# Patient Record
Sex: Female | Born: 1980
Health system: Southern US, Community
[De-identification: ages and names within clinical notes are randomized; demographics above are authoritative.]

## PROBLEM LIST (undated history)

## (undated) DIAGNOSIS — F424 Excoriation (skin-picking) disorder: Secondary | ICD-10-CM

## (undated) DIAGNOSIS — R87610 Atypical squamous cells of undetermined significance on cytologic smear of cervix (ASC-US): Secondary | ICD-10-CM

## (undated) DIAGNOSIS — J329 Chronic sinusitis, unspecified: Secondary | ICD-10-CM

## (undated) DIAGNOSIS — F329 Major depressive disorder, single episode, unspecified: Secondary | ICD-10-CM

## (undated) DIAGNOSIS — M779 Enthesopathy, unspecified: Secondary | ICD-10-CM

## (undated) DIAGNOSIS — F32A Depression, unspecified: Secondary | ICD-10-CM

## (undated) HISTORY — PX: COLPOSCOPY: SHX161

## (undated) HISTORY — PX: WISDOM TOOTH EXTRACTION: SHX21

## (undated) HISTORY — DX: Enthesopathy, unspecified: M77.9

---

## 2006-08-29 ENCOUNTER — Ambulatory Visit: Payer: Self-pay | Admitting: Emergency Medicine

## 2007-11-11 ENCOUNTER — Ambulatory Visit: Payer: Self-pay | Admitting: Family Medicine

## 2008-01-29 ENCOUNTER — Ambulatory Visit: Payer: Self-pay | Admitting: Family Medicine

## 2008-11-30 ENCOUNTER — Ambulatory Visit: Payer: Self-pay | Admitting: Internal Medicine

## 2008-12-02 ENCOUNTER — Ambulatory Visit: Payer: Self-pay | Admitting: Internal Medicine

## 2009-02-01 ENCOUNTER — Ambulatory Visit: Payer: Self-pay | Admitting: Internal Medicine

## 2010-01-10 ENCOUNTER — Ambulatory Visit: Payer: Self-pay | Admitting: Internal Medicine

## 2011-07-17 DIAGNOSIS — F424 Excoriation (skin-picking) disorder: Secondary | ICD-10-CM | POA: Insufficient documentation

## 2012-10-30 ENCOUNTER — Ambulatory Visit: Payer: Self-pay | Admitting: Family Medicine

## 2013-07-31 DIAGNOSIS — R87810 Cervical high risk human papillomavirus (HPV) DNA test positive: Secondary | ICD-10-CM | POA: Insufficient documentation

## 2013-07-31 DIAGNOSIS — R87610 Atypical squamous cells of undetermined significance on cytologic smear of cervix (ASC-US): Secondary | ICD-10-CM | POA: Insufficient documentation

## 2015-11-08 ENCOUNTER — Ambulatory Visit
Admission: EM | Admit: 2015-11-08 | Discharge: 2015-11-08 | Disposition: A | Discharge status: Home / Self Care | Payer: No Typology Code available for payment source | Attending: Family Medicine | Admitting: Family Medicine

## 2015-11-08 ENCOUNTER — Encounter: Payer: Self-pay | Admitting: Gynecology

## 2015-11-08 DIAGNOSIS — M6289 Other specified disorders of muscle: Secondary | ICD-10-CM | POA: Diagnosis not present

## 2015-11-08 DIAGNOSIS — G5622 Lesion of ulnar nerve, left upper limb: Secondary | ICD-10-CM | POA: Diagnosis not present

## 2015-11-08 DIAGNOSIS — R29898 Other symptoms and signs involving the musculoskeletal system: Secondary | ICD-10-CM

## 2015-11-08 HISTORY — DX: Chronic sinusitis, unspecified: J32.9

## 2015-11-08 HISTORY — DX: Major depressive disorder, single episode, unspecified: F32.9

## 2015-11-08 HISTORY — DX: Atypical squamous cells of undetermined significance on cytologic smear of cervix (ASC-US): R87.610

## 2015-11-08 HISTORY — DX: Depression, unspecified: F32.A

## 2015-11-08 HISTORY — DX: Excoriation (skin-picking) disorder: F42.4

## 2015-11-08 MED ORDER — MELOXICAM 15 MG PO TABS: 15.0000 mg | ORAL_TABLET | Freq: Every day | ORAL | 0 refills | Status: DC

## 2015-11-08 NOTE — ED Triage Notes (Signed)
Patient c/o weakness to her right hand x today for about 20 minutes.

## 2015-11-08 NOTE — ED Provider Notes (Signed)
[facility]    CSN: [id number] Arrival date & time: 11/08/15  1137  First Provider Contact:  First MD Initiated Contact with Patient 11/08/15 1313        History   Chief Complaint Chief Complaint  Patient presents with  . Hand Problem    HPI Jamie Dudley is a 35 y.o. female.   Patient's here because of numbness of her right hand and weakness. She states she was giving her husband had massage this morning when the hand 70 became numb and weak. She denies it was a cramping sensation since states that her hand cane now. Since this morning her hand as well as become better still most the numbness of the hand resolved. She states that she's has some numbness of the hand before but never to this degree. Past medical history she has a history of some anxiety and depression she has been recently diagnosed with sinusitis and she has some atypical squamous cells on her cervix. There is no family history pertinent to today's visit. She does not smoke. And only surgeries been colposcopy and was to extraction she's currently Augmentin for a sinus infection.   The history is provided by the patient. No language interpreter was used.  Wrist Pain  This is a recurrent problem. The current episode started 6 to 12 hours ago. The problem has been rapidly improving. Pertinent negatives include no chest pain, no abdominal pain, no headaches and no shortness of breath. The symptoms are aggravated by exertion. Nothing relieves the symptoms. She has tried nothing for the symptoms. The treatment provided no relief.    Past Medical History:  Diagnosis Date  . Atypical squamous cells of undetermined significance (ASC-US) on cervical Pap smear   . Depression   . Sinusitis   . Skin-picking disorder     There are no active problems to display for this patient.   Past Surgical History:  Procedure Laterality Date  . COLPOSCOPY    . WISDOM TOOTH EXTRACTION      OB History    No data available         Home Medications    Prior to Admission medications   Medication Sig Start Date End Date Taking? Authorizing Provider  amoxicillin-clavulanate (AUGMENTIN) 875-125 MG tablet Take 1 tablet by mouth 2 (two) times daily.   Yes Historical Provider, MD  fluticasone (FLONASE) 50 MCG/ACT nasal spray Place into both nostrils daily.   Yes Historical Provider, MD  medroxyPROGESTERone (DEPO-PROVERA) 150 MG/ML injection Inject 150 mg into the muscle every 3 (three) months.   Yes Historical Provider, MD  meloxicam (MOBIC) 15 MG tablet Take 1 tablet (15 mg total) by mouth daily. Do not take w/motrin 11/08/15   Hassan Rowan, MD    Family History No family history on file.  Social History Social History  Substance Use Topics  . Smoking status: Never Smoker  . Smokeless tobacco: Never Used  . Alcohol use No     Allergies   Review of patient's allergies indicates no known allergies.   Review of Systems Review of Systems  Respiratory: Negative for shortness of breath.   Cardiovascular: Negative for chest pain.  Gastrointestinal: Negative for abdominal pain.  Neurological: Positive for weakness. Negative for headaches.  All other systems reviewed and are negative.    Physical Exam Triage Vital Signs ED Triage Vitals  Enc Vitals Group     BP 11/08/15 1212 (!) 144/87     Pulse Rate 11/08/15 1212 84  Resp 11/08/15 1212 16     Temp 11/08/15 1212 98.8 F (37.1 C)     Temp Source 11/08/15 1212 Oral     SpO2 11/08/15 1212 100 %     Weight 11/08/15 1214 138 lb (62.6 kg)     Height 11/08/15 1214 5\' 4"  (1.626 m)     Head Circumference --      Peak Flow --      Pain Score 11/08/15 1216 2     Pain Loc --      Pain Edu? --      Excl. in GC? --    No data found.   Updated Vital Signs BP (!) 144/87 (BP Location: Left Arm)   Pulse 84   Temp 98.8 F (37.1 C) (Oral)   Resp 16   Ht 5\' 4"  (1.626 m)   Wt 138 lb (62.6 kg)   LMP 10/23/2015 (Exact Date)   SpO2 100%   BMI 23.69  kg/m   Visual Acuity Right Eye Distance:   Left Eye Distance:   Bilateral Distance:    Right Eye Near:   Left Eye Near:    Bilateral Near:     Physical Exam  Constitutional: She appears well-developed and well-nourished.  HENT:  Head: Normocephalic and atraumatic.  Eyes: Pupils are equal, round, and reactive to light.  Neck: Normal range of motion.  Pulmonary/Chest: Effort normal.  Musculoskeletal: Normal range of motion. She exhibits no edema or deformity.       Right shoulder: Normal. She exhibits normal range of motion, no tenderness, no bony tenderness, no swelling and no deformity.       Right wrist: She exhibits tenderness. She exhibits no swelling and no effusion.       Cervical back: Normal. She exhibits normal range of motion, no tenderness, no deformity and no spasm.       Arms: Patient is not having tenderness over the right shoulder and and neck. She did not have any tenderness over the distal radius and there is no tenderness over the median nerve area. Has reflects impressed with no exacerbation of the median nerve. She did have tenderness over the the distal ulnar nerve near the junction of the wrist  Lymphadenopathy:    She has no cervical adenopathy.  Neurological: She is alert. She has normal strength. She displays no atrophy and no tremor. No cranial nerve deficit or sensory deficit. She exhibits normal muscle tone. Coordination and gait normal.  Skin: Skin is warm.  Psychiatric: Her mood appears anxious.  Patient neck turned different shades of red different areas. She states that she will have blushing of her neck and blanching of the neck were talking to strangers  Vitals reviewed.    UC Treatments / Results  Labs (all labs ordered are listed, but only abnormal results are displayed) Labs Reviewed - No data to display  EKG  EKG Interpretation None       Radiology No results found.  Procedures Procedures (including critical care  time)  Medications Ordered in UC Medications - No data to display   Initial Impression / Assessment and Plan / UC Course  I have reviewed the triage vital signs and the nursing notes.  Pertinent labs & imaging results that were available during my care of the patient were reviewed by me and considered in my medical decision making (see chart for details).  Clinical Course    The patient probably does have some type of ulnar nerve entrapment. Since  his no signs of atrophy of the palmar surface of the hand feel this is very early. Explained patient options is to just watch it and see if it gets worse. She can wear a wrist splint for a few weeks at night take Mobic anti-inflammatory 15 mg during the day and see if that helps. If it gets worse or if his remains persistent that she may want to consider seeing an orthopedic for nerve conduction study test and possible surgery to free the nerve if this shown to be entrapped. At this time this no signs of stroke or other CNS manifestation.  Final Clinical Impressions(s) / UC Diagnoses   Final diagnoses:  Right hand weakness  Ulnar neuropathy at wrist, left    New Prescriptions New Prescriptions   MELOXICAM (MOBIC) 15 MG TABLET    Take 1 tablet (15 mg total) by mouth daily. Do not take w/motrin    Note: This dictation was prepared with Dragon dictation along with smaller phrase technology. Any transcriptional errors that result from this process are unintentional.   Hassan Rowan, MD 11/08/15 1438

## 2016-06-14 ENCOUNTER — Ambulatory Visit (INDEPENDENT_AMBULATORY_CARE_PROVIDER_SITE_OTHER): Payer: No Typology Code available for payment source | Admitting: Family Medicine

## 2016-06-14 ENCOUNTER — Encounter: Payer: Self-pay | Admitting: Family Medicine

## 2016-06-14 VITALS — BP 130/86 | HR 64 | Ht 65.0 in | Wt 133.0 lb

## 2016-06-14 DIAGNOSIS — Z7689 Persons encountering health services in other specified circumstances: Secondary | ICD-10-CM | POA: Diagnosis not present

## 2016-06-14 DIAGNOSIS — M949 Disorder of cartilage, unspecified: Secondary | ICD-10-CM

## 2016-06-14 DIAGNOSIS — L509 Urticaria, unspecified: Secondary | ICD-10-CM

## 2016-06-14 MED ORDER — LORATADINE 10 MG PO TABS: 10.0000 mg | ORAL_TABLET | Freq: Every day | ORAL | 11 refills | Status: DC

## 2016-06-14 NOTE — Progress Notes (Signed)
Name: Jamie Dudley   MRN: [id number]    DOB: 1980/07/25   Date:06/14/2016       Progress Note  Subjective  Chief Complaint  Chief Complaint  Patient presents with  . Establish Care    insurance changed  . Ear Pain    R) ear has a "knot that hurts when I poke at it"    Patient to establish care.   Rash  This is a chronic problem. The current episode started more than 1 year ago. The problem has been waxing and waning since onset. The rash is diffuse. The rash is characterized by itchiness (urticarial). Pertinent negatives include no cough, diarrhea, fever, joint pain, shortness of breath or sore throat. Past treatments include nothing. The treatment provided moderate relief. Her past medical history is significant for eczema.    No problem-specific Assessment & Plan notes found for this encounter.   Past Medical History:  Diagnosis Date  . Atypical squamous cells of undetermined significance (ASC-US) on cervical Pap smear   . Depression   . Sinusitis   . Skin-picking disorder     Past Surgical History:  Procedure Laterality Date  . COLPOSCOPY    . WISDOM TOOTH EXTRACTION      Family History  Problem Relation Age of Onset  . Heart disease Paternal Grandfather     Social History   Social History  . Marital status: Single    Spouse name: N/A  . Number of children: N/A  . Years of education: N/A   Occupational History  . Not on file.   Social History Main Topics  . Smoking status: Never Smoker  . Smokeless tobacco: Never Used  . Alcohol use Yes  . Drug use: No  . Sexual activity: Yes   Other Topics Concern  . Not on file   Social History Narrative  . No narrative on file    No Known Allergies  Outpatient Medications Prior to Visit  Medication Sig Dispense Refill  . medroxyPROGESTERone (DEPO-PROVERA) 150 MG/ML injection Inject 150 mg into the muscle every 3 (three) months.    Marland Kitchen amoxicillin-clavulanate (AUGMENTIN) 875-125 MG tablet Take 1 tablet by mouth 2  (two) times daily.    . fluticasone (FLONASE) 50 MCG/ACT nasal spray Place into both nostrils daily.    . meloxicam (MOBIC) 15 MG tablet Take 1 tablet (15 mg total) by mouth daily. Do not take w/motrin 30 tablet 0   No facility-administered medications prior to visit.     Review of Systems  Constitutional: Negative for chills, fever, malaise/fatigue and weight loss.  HENT: Negative for ear discharge, ear pain and sore throat.   Eyes: Negative for blurred vision.  Respiratory: Negative for cough, sputum production, shortness of breath and wheezing.   Cardiovascular: Negative for chest pain, palpitations and leg swelling.  Gastrointestinal: Negative for abdominal pain, blood in stool, constipation, diarrhea, heartburn, melena and nausea.  Genitourinary: Negative for dysuria, frequency, hematuria and urgency.  Musculoskeletal: Negative for back pain, joint pain, myalgias and neck pain.  Skin: Negative for rash.  Neurological: Negative for dizziness, tingling, sensory change, focal weakness and headaches.  Endo/Heme/Allergies: Negative for environmental allergies and polydipsia. Does not bruise/bleed easily.  Psychiatric/Behavioral: Negative for depression and suicidal ideas. The patient is not nervous/anxious and does not have insomnia.      Objective  Vitals:   06/14/16 1451  BP: 130/86  Pulse: 64  Weight: 133 lb (60.3 kg)  Height:  (1.651 m)    Physical Exam  Constitutional: She is well-developed, well-nourished, and in no distress. No distress.  HENT:  Head: Normocephalic and atraumatic.  Right Ear: External ear normal.  Left Ear: External ear normal.  Nose: Nose normal.  Mouth/Throat: Oropharynx is clear and moist.  Eyes: Conjunctivae and EOM are normal. Pupils are equal, round, and reactive to light. Right eye exhibits no discharge. Left eye exhibits no discharge.  Neck: Normal range of motion. Neck supple. No JVD present. No thyromegaly present.  Cardiovascular:  Normal rate, regular rhythm, normal heart sounds and intact distal pulses.  Exam reveals no gallop and no friction rub.   No murmur heard. Pulmonary/Chest: Effort normal and breath sounds normal. She has no wheezes. She has no rales.  Abdominal: Soft. Bowel sounds are normal. She exhibits no mass. There is no tenderness. There is no guarding.  Musculoskeletal: Normal range of motion. She exhibits no edema.  Lymphadenopathy:    She has no cervical adenopathy.  Neurological: She is alert. She has normal reflexes.  Skin: Skin is warm and dry. She is not diaphoretic.  Psychiatric: Mood and affect normal.  Nursing note and vitals reviewed.     Assessment & Plan  Problem List Items Addressed This Visit    None    Visit Diagnoses    Establishing care with new doctor, encounter for    -  Primary   Urticaria       neurodermatitis   Relevant Orders   Ambulatory referral to Dermatology   Cartilage disease       Relevant Orders   Ambulatory referral to Dermatology      Meds ordered this encounter  Medications  . loratadine (CLARITIN) 10 MG tablet    Sig: Take 1 tablet (10 mg total) by mouth daily.    Dispense:  30 tablet    Refill:  11   Advised to call [facility] for psychiatric needs.   Dr. Hayden [facility]  06/14/16

## 2016-06-28 ENCOUNTER — Ambulatory Visit (INDEPENDENT_AMBULATORY_CARE_PROVIDER_SITE_OTHER): Payer: No Typology Code available for payment source | Admitting: Licensed Clinical Social Worker

## 2016-06-28 DIAGNOSIS — F339 Major depressive disorder, recurrent, unspecified: Secondary | ICD-10-CM | POA: Diagnosis not present

## 2016-06-28 DIAGNOSIS — F429 Obsessive-compulsive disorder, unspecified: Secondary | ICD-10-CM
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
## 2016-07-26 ENCOUNTER — Ambulatory Visit (INDEPENDENT_AMBULATORY_CARE_PROVIDER_SITE_OTHER): Payer: No Typology Code available for payment source | Admitting: Licensed Clinical Social Worker

## 2016-07-26 DIAGNOSIS — F339 Major depressive disorder, recurrent, unspecified: Secondary | ICD-10-CM | POA: Diagnosis not present

## 2016-07-26 DIAGNOSIS — F429 Obsessive-compulsive disorder, unspecified: Secondary | ICD-10-CM

## 2016-07-28 NOTE — Progress Notes (Signed)
   THERAPIST PROGRESS NOTE  Session Time: 64mn  Participation Level: Active  Behavioral Response: CasualAlertEuthymic  Type of Therapy: Individual Therapy  Treatment Goals addressed: Coping  Interventions: Supportive and Reframing  Summary: Jamie LBattonis a 36y.o. female who presents with continued symptoms of her diagnosis.   Therapist met with Patient in an initial therapy session to assess current mood and to build rapport. Therapist engaged Patient in discussion about her life and what is going well for her. Therapist provided support for Patient as she shared details about her life, her current stressors, mood, coping skills, her past, and her job. Therapist prompted Patient to discuss her support system and ways that she manages her daily stress, anger, and frustrations.   Suicidal/Homicidal: No  Therapist Response: LCSW discussed what psychotherapy is and is not and the importance of the therapeutic relationship to include open and honest communication between client and therapist and building trust.  Reviewed advantages and disadvantages of the therapeutic process and limitations to the therapeutic relationship including LCSW's role in maintaining the safety of the client, others and those in client's care.   Plan: Return again in 2weeks.  Diagnosis: Axis I: Depression, OCD    Axis II: No diagnosis    NLubertha South LCSW 07/28/2016

## 2016-08-02 ENCOUNTER — Ambulatory Visit (INDEPENDENT_AMBULATORY_CARE_PROVIDER_SITE_OTHER): Payer: No Typology Code available for payment source | Admitting: Psychiatry

## 2016-08-02 ENCOUNTER — Encounter: Payer: Self-pay | Admitting: Psychiatry

## 2016-08-02 VITALS — BP 127/85 | HR 89 | Temp 98.4°F | Wt 137.8 lb

## 2016-08-02 DIAGNOSIS — F331 Major depressive disorder, recurrent, moderate: Secondary | ICD-10-CM | POA: Diagnosis not present

## 2016-08-02 MED ORDER — SERTRALINE HCL 25 MG PO TABS: 25.0000 mg | ORAL_TABLET | Freq: Every day | ORAL | 2 refills | Status: DC

## 2016-08-02 NOTE — Progress Notes (Signed)
Psychiatric Initial Adult Assessment   Patient Identification: Jamie Dudley MRN:  [id number] Date of Evaluation:  08/02/2016 Referral Source: Nolon Rod Chief Complaint:   Chief Complaint    Follow-up; Medication Refill     Visit Diagnosis:    ICD-9-CM ICD-10-CM   1. MDD (major depressive disorder), recurrent episode, moderate (HCC) 296.32 F33.1     History of Present Illness:  Patient is a 36 year old Caucasian female who was referred to this clinician by her therapist Nolon Rod. Patient reports that she works as a Associate Professor at a preschool. Reports that over the past year she has been easily overwhelmed with her emotions. She does not have any prior psychiatric history. 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 However she reports that over the past few months she has had some conflict with a co-teacher and it has been bothering her. States that she is normally not like this. Reports fair sleep and appetite. States that her husband is in grad school and she is the main breadwinner. Also reports stressors in the form of her father-in-law being diagnosed with lymphoma. States that currently insurance is not covering for his care and they will be bringing him home. States that her husband is stressed at this news. Patient denies any problems with drugs or alcohol. States that she has never been abused. Denies any psychotic symptoms. Denies any previous psychiatric hospitalizations. Denies any suicide attempts. She has never seen a psychiatrist. States that it is strange for her to think of taking any medication for her mood.  Patient reports symptomsof picking at skin, emotions at the surface, sadness, overwhelmed, stress, frustrated, no change with appetite or sleep, irritability, worries, restless, tension, difficulty concentrating, change in activity level, poor energy.  PHQ- 9 of 10  Past Psychiatric History: Never been admitted psychiatrically, denies any  suicide attempts  Previous Psychotropic Medications: No   Substance Abuse History in the last 12 months:  No.  Consequences of Substance Abuse: Negative  Past Medical History:  Past Medical History:  Diagnosis Date  . Atypical squamous cells of undetermined significance (ASC-US) on cervical Pap smear   . Depression   . Sinusitis   . Skin-picking disorder     Past Surgical History:  Procedure Laterality Date  . COLPOSCOPY    . WISDOM TOOTH EXTRACTION      Family Psychiatric History:   Family History:  Family History  Problem Relation Age of Onset  . Heart disease Paternal Grandfather     Social History:   Social History   Social History  . Marital status: Single    Spouse name: N/A  . Number of children: N/A  . Years of education: N/A   Social History Main Topics  . Smoking status: Never Smoker  . Smokeless tobacco: Never Used  . Alcohol use Yes  . Drug use: No  . Sexual activity: Yes   Other Topics Concern  . None   Social History Narrative  . None    Additional Social History: lives with her husband.  Allergies:  No Known Allergies  Metabolic Disorder Labs: No results found for: HGBA1C, MPG No results found for: PROLACTIN No results found for: CHOL, TRIG, HDL, CHOLHDL, VLDL, LDLCALC   Current Medications: Current Outpatient Prescriptions  Medication Sig Dispense Refill  . loratadine (CLARITIN) 10 MG tablet Take 1 tablet (10 mg total) by mouth daily. 30 tablet 11  . medroxyPROGESTERone (DEPO-PROVERA) 150 MG/ML injection Inject 150 mg into the muscle every 3 (  three) months.     No current facility-administered medications for this visit.     Neurologic: Headache: No Seizure: No Paresthesias:No  Musculoskeletal: Strength & Muscle Tone: within normal limits Gait & Station: normal Patient leans: N/A  Psychiatric Specialty Exam: ROS  Blood pressure 127/85, pulse 89, temperature 98.4 F (36.9 C), temperature source Oral, weight 137 lb  12.8 oz (62.5 kg).Body mass index is 22.93 kg/m.  General Appearance: Casual  Eye Contact:  Fair  Speech:  Clear and Coherent  Volume:  Decreased  Mood:  Depressed and Dysphoric  Affect:  Depressed, Labile and Tearful  Thought Process:  Coherent  Orientation:  Full (Time, Place, and Person)  Thought Content:  Logical  Suicidal Thoughts:  No  Homicidal Thoughts:  No  Memory:  Immediate;   Fair Recent;   Fair Remote;   Fair  Judgement:  Fair  Insight:  Fair  Psychomotor Activity:  Normal  Concentration:  Concentration: Fair and Attention Span: Fair  Recall:  Fis[employer]Fair  Fund of Knowledge:Fair  Language: Poor  Akathisia:  No  Handed:  Right  AIMS (if indicated):  na  Assets:  Communication Skills Desire for Improvement Housing Physical Health Social Support Vocational/Educational  ADL's:  Intact  Cognition: WNL  Sleep:      Treatment Plan Summary: Major depressive disorder moderate Start Zoloft at 25 mg once daily. We discussed the side effects and benefits. Continue therapy with Nolon RodNicole Peacock. However patient reports that down she does not have the money and may have to start stop therapy soon.  Return to clinic in 2-3 weeks time or call before if needed   Patrick NorthAVI, Satya Bohall, MD 5/22/20182:21 PM

## 2016-08-09 ENCOUNTER — Ambulatory Visit: Payer: No Typology Code available for payment source | Admitting: Licensed Clinical Social Worker

## 2016-08-16 ENCOUNTER — Ambulatory Visit: Payer: No Typology Code available for payment source | Admitting: Psychiatry

## 2016-09-04 ENCOUNTER — Ambulatory Visit
Admission: EM | Admit: 2016-09-04 | Discharge: 2016-09-04 | Disposition: A | Discharge status: Home / Self Care | Payer: No Typology Code available for payment source | Attending: Emergency Medicine | Admitting: Emergency Medicine

## 2016-09-04 ENCOUNTER — Encounter: Payer: Self-pay | Admitting: Gynecology

## 2016-09-04 DIAGNOSIS — S70361A Insect bite (nonvenomous), right thigh, initial encounter: Secondary | ICD-10-CM | POA: Diagnosis not present

## 2016-09-04 DIAGNOSIS — N3000 Acute cystitis without hematuria: Secondary | ICD-10-CM | POA: Diagnosis present

## 2016-09-04 DIAGNOSIS — R112 Nausea with vomiting, unspecified: Secondary | ICD-10-CM

## 2016-09-04 DIAGNOSIS — W57XXXA Bitten or stung by nonvenomous insect and other nonvenomous arthropods, initial encounter: Secondary | ICD-10-CM | POA: Diagnosis not present

## 2016-09-04 DIAGNOSIS — R197 Diarrhea, unspecified: Secondary | ICD-10-CM

## 2016-09-04 LAB — URINALYSIS, COMPLETE (UACMP) WITH MICROSCOPIC
Bilirubin Urine: NEGATIVE
Glucose, UA: NEGATIVE mg/dL
Hgb urine dipstick: NEGATIVE
Ketones, ur: NEGATIVE mg/dL
Leukocytes, UA: NEGATIVE
Nitrite: POSITIVE — AB
Specific Gravity, Urine: 1.02 (ref 1.005–1.030)
pH: 8.5 — ABNORMAL HIGH (ref 5.0–8.0)

## 2016-09-04 LAB — PREGNANCY, URINE: PREG TEST UR: NEGATIVE

## 2016-09-04 MED ORDER — ONDANSETRON HCL 4 MG PO TABS: 4.0000 mg | ORAL_TABLET | Freq: Four times a day (QID) | ORAL | 0 refills | Status: DC

## 2016-09-04 MED ORDER — SULFAMETHOXAZOLE-TRIMETHOPRIM 800-160 MG PO TABS: 1.0000 | ORAL_TABLET | Freq: Two times a day (BID) | ORAL | 0 refills | Status: DC

## 2016-09-04 MED ORDER — PHENAZOPYRIDINE HCL 200 MG PO TABS: 200.0000 mg | ORAL_TABLET | Freq: Three times a day (TID) | ORAL | 0 refills | Status: DC

## 2016-09-04 NOTE — ED Provider Notes (Signed)
CSN: [id number]     Arrival date & time 09/04/16  1114 History   None    Chief Complaint  Patient presents with  . Emesis  . Nausea  . Insect Bite   (Consider location/radiation/quality/duration/timing/severity/associated sxs/prior Treatment) 36 yr old caucasian female presents to UC with cc of N,V,D since this am, noticed insect bite last night to right upper thigh.    The history is provided by the patient. No language interpreter was used.  Diarrhea  Quality:  Watery Severity:  Mild Onset quality:  Sudden Timing:  Constant Progression:  Unchanged Relieved by:  Nothing Worsened by:  Nothing Ineffective treatments:  None tried Associated symptoms: headaches and vomiting   Associated symptoms: no abdominal pain, no chills and no fever   Risk factors comment:  Recently went to wedding, unknown illness exposure   Past Medical History:  Diagnosis Date  . Atypical squamous cells of undetermined significance (ASC-US) on cervical Pap smear   . Depression   . Sinusitis   . Skin-picking disorder    Past Surgical History:  Procedure Laterality Date  . COLPOSCOPY    . WISDOM TOOTH EXTRACTION     Family History  Problem Relation Age of Onset  . Heart disease Paternal Grandfather    Social History  Substance Use Topics  . Smoking status: Never Smoker  . Smokeless tobacco: Never Used  . Alcohol use Yes   OB History    No data available     Review of Systems  Constitutional: Positive for appetite change. Negative for chills and fever.  HENT: Negative.   Respiratory: Negative for shortness of breath.   Cardiovascular: Negative for chest pain.  Gastrointestinal: Positive for diarrhea, nausea and vomiting. Negative for abdominal pain and constipation.  Endocrine: Negative.   Genitourinary: Negative for dysuria, hematuria, vaginal bleeding and vaginal discharge.  Skin: Negative for rash.  Allergic/Immunologic: Negative.   Neurological: Positive for headaches.   Hematological: Negative.   Psychiatric/Behavioral: Negative.   All other systems reviewed and are negative.   Allergies  Patient has no known allergies.  Home Medications   Prior to Admission medications   Medication Sig Start Date End Date Taking? Authorizing Provider  loratadine (CLARITIN) 10 MG tablet Take 1 tablet (10 mg total) by mouth daily. 06/14/16   Duanne Limerick, MD  ondansetron (ZOFRAN) 4 MG tablet Take 1 tablet (4 mg total) by mouth every 6 (six) hours. 09/04/16   Lavra Imler, Para March, NP  phenazopyridine (PYRIDIUM) 200 MG tablet Take 1 tablet (200 mg total) by mouth 3 (three) times daily. 09/04/16   Soriah Leeman, Para March, NP  sertraline (ZOLOFT) 25 MG tablet Take 1 tablet (25 mg total) by mouth daily. 08/02/16 08/02/17  Patrick North, MD  sulfamethoxazole-trimethoprim (BACTRIM DS,SEPTRA DS) 800-160 MG tablet Take 1 tablet by mouth 2 (two) times daily. 09/04/16   Trino Higinbotham, Para March, NP   Meds Ordered and Administered this Visit  Medications - No data to display  Ht 5\' 5"  (1.651 m)   Wt 130 lb (59 kg)   BMI 21.63 kg/m  No data found.   Physical Exam  Constitutional: She is oriented to person, place, and time. Vital signs are normal. She appears well-developed and well-nourished. She is active. No distress.  HENT:  Head: Normocephalic.  Eyes: Pupils are equal, round, and reactive to light.  Neck: Normal range of motion.  Cardiovascular: Normal rate and regular rhythm.   Pulmonary/Chest: Effort normal and breath sounds normal.  Abdominal: Soft. Normal appearance. Bowel sounds are  increased. There is no tenderness.  Musculoskeletal: Normal range of motion.  Neurological: She is alert and oriented to person, place, and time. GCS eye subscore is 4. GCS verbal subscore is 5. GCS motor subscore is 6.  Skin: Skin is warm and dry.  Psychiatric: She has a normal mood and affect. Her speech is normal and behavior is normal.  Nursing note and vitals reviewed.   Urgent Care Course      Procedures (including critical care time)  Labs Review Labs Reviewed  URINALYSIS, COMPLETE (UACMP) WITH MICROSCOPIC - Abnormal; Notable for the following:       Result Value   APPearance HAZY (*)    pH 8.5 (*)    Protein, ur TRACE (*)    Nitrite POSITIVE (*)    Squamous Epithelial / LPF 6-30 (*)    Bacteria, UA MANY (*)    All other components within normal limits  URINE CULTURE  PREGNANCY, URINE    Imaging Review No results found.        MDM   1. Nausea vomiting and diarrhea   2. Acute cystitis without hematuria   3. Insect bite, initial encounter     UA is negative for ketones. Discussed plan of care with pt and family: Your pregnancy test was negative, you have a UTI. You may have a viral illness as well. Rest,push fluids, take meds as directed(Bactrim, will cover UTI and insect bite) also given pyridium and zofran . Follow up with your PCP in 2-3 days for recheck if no improvement, sooner if worse. Pt verbalized understanding to this provider.    Clancy Gourdefelice, Inioluwa Baris, NP 09/04/16 1256

## 2016-09-04 NOTE — Discharge Instructions (Signed)
Your pregnancy test was negative, you have a UTI. You may have a viral illness as well. Rest,push fluids, take meds as directed. Follow up with your PCP in 2-3 days for recheck if no improvement, sooner if worse.

## 2016-09-04 NOTE — ED Triage Notes (Signed)
Patient stated notice this morning insect bite at right hip . Per patient having nausea / dizziness and vomit several times.

## 2016-09-07 ENCOUNTER — Ambulatory Visit (INDEPENDENT_AMBULATORY_CARE_PROVIDER_SITE_OTHER): Payer: No Typology Code available for payment source | Admitting: Family Medicine

## 2016-09-07 ENCOUNTER — Encounter: Payer: Self-pay | Admitting: Family Medicine

## 2016-09-07 VITALS — BP 120/86 | HR 82 | Temp 97.6°F | Ht 65.0 in | Wt 135.0 lb

## 2016-09-07 DIAGNOSIS — Z01419 Encounter for gynecological examination (general) (routine) without abnormal findings: Secondary | ICD-10-CM

## 2016-09-07 DIAGNOSIS — Z3042 Encounter for surveillance of injectable contraceptive: Secondary | ICD-10-CM

## 2016-09-07 LAB — HEMOCCULT GUIAC POC 1CARD (OFFICE)

## 2016-09-07 LAB — URINE CULTURE: Culture: >=100000 — AB

## 2016-09-07 MED ORDER — MEDROXYPROGESTERONE ACETATE 150 MG/ML IM SUSP: 150.0000 mg | INTRAMUSCULAR | 3 refills | Status: DC

## 2016-09-07 NOTE — Progress Notes (Signed)
Name: Jamie Dudley   MRN: [id number]    DOB: 01-04-1981   Date:09/08/2016       Progress Note  Subjective  Chief Complaint  Chief Complaint  Patient presents with  . Annual Exam    Patient presents for pap and pelvic.    No problem-specific Assessment & Plan notes found for this encounter.   Past Medical History:  Diagnosis Date  . Atypical squamous cells of undetermined significance (ASC-US) on cervical Pap smear   . Depression   . Sinusitis   . Skin-picking disorder     Past Surgical History:  Procedure Laterality Date  . COLPOSCOPY    . WISDOM TOOTH EXTRACTION      Family History  Problem Relation Age of Onset  . Heart disease Paternal Grandfather     Social History   Social History  . Marital status: Single    Spouse name: N/A  . Number of children: N/A  . Years of education: N/A   Occupational History  . Not on file.   Social History Main Topics  . Smoking status: Never Smoker  . Smokeless tobacco: Never Used  . Alcohol use Yes  . Drug use: No  . Sexual activity: Yes   Other Topics Concern  . Not on file   Social History Narrative  . No narrative on file    No Known Allergies  Outpatient Medications Prior to Visit  Medication Sig Dispense Refill  . loratadine (CLARITIN) 10 MG tablet Take 1 tablet (10 mg total) by mouth daily. 30 tablet 11  . phenazopyridine (PYRIDIUM) 200 MG tablet Take 1 tablet (200 mg total) by mouth 3 (three) times daily. 6 tablet 0  . sertraline (ZOLOFT) 25 MG tablet Take 1 tablet (25 mg total) by mouth daily. (Patient not taking: Reported on 09/07/2016) 30 tablet 2  . ondansetron (ZOFRAN) 4 MG tablet Take 1 tablet (4 mg total) by mouth every 6 (six) hours. 12 tablet 0  . sulfamethoxazole-trimethoprim (BACTRIM DS,SEPTRA DS) 800-160 MG tablet Take 1 tablet by mouth 2 (two) times daily. 14 tablet 0   No facility-administered medications prior to visit.     Review of Systems  Constitutional: Negative for chills, fever,  malaise/fatigue and weight loss.  HENT: Negative for ear discharge, ear pain and sore throat.   Eyes: Negative for blurred vision.  Respiratory: Negative for cough, sputum production, shortness of breath and wheezing.   Cardiovascular: Negative for chest pain, palpitations and leg swelling.  Gastrointestinal: Negative for abdominal pain, blood in stool, constipation, diarrhea, heartburn, melena and nausea.  Genitourinary: Negative for dysuria, frequency, hematuria and urgency.  Musculoskeletal: Negative for back pain, joint pain, myalgias and neck pain.  Skin: Negative for rash.  Neurological: Negative for dizziness, tingling, sensory change, focal weakness and headaches.  Endo/Heme/Allergies: Negative for environmental allergies and polydipsia. Does not bruise/bleed easily.  Psychiatric/Behavioral: Negative for depression and suicidal ideas. The patient is not nervous/anxious and does not have insomnia.      Objective  Vitals:   09/07/16 0858  BP: 120/86  Pulse: 82  Temp: 97.6 F (36.4 C)  SpO2: 99%  Weight: 135 lb (61.2 kg)  Height: 5\' 5"  (1.651 m)    Physical Exam  Constitutional: She is well-developed, well-nourished, and in no distress. No distress.  HENT:  Head: Normocephalic and atraumatic.  Right Ear: External ear normal.  Left Ear: External ear normal.  Nose: Nose normal.  Mouth/Throat: Oropharynx is clear and moist.  Eyes: Conjunctivae and EOM are normal.  Pupils are equal, round, and reactive to light. Right eye exhibits no discharge. Left eye exhibits no discharge.  Neck: Normal range of motion. Neck supple. No JVD present. No thyromegaly present.  Cardiovascular: Normal rate, regular rhythm, normal heart sounds and intact distal pulses.  Exam reveals no gallop and no friction rub.   No murmur heard. Pulmonary/Chest: Effort normal and breath sounds normal. She has no wheezes. She has no rales.  Abdominal: Soft. Bowel sounds are normal. She exhibits no mass. There  is no tenderness. There is no guarding.  Genitourinary: Vagina normal, uterus normal, cervix normal, right adnexa normal and left adnexa normal. Rectal exam shows guaiac negative stool.  Musculoskeletal: Normal range of motion. She exhibits no edema.  Lymphadenopathy:    She has no cervical adenopathy.  Neurological: She is alert.  Skin: Skin is warm and dry. She is not diaphoretic.  Psychiatric: Mood and affect normal.  Nursing note and vitals reviewed.     Assessment & Plan  Problem List Items Addressed This Visit    None    Visit Diagnoses    Gynecologic exam normal    -  Primary   Relevant Orders   Pap IG and HPV (high risk) DNA detection   POCT occult blood stool (Completed)   Encounter for surveillance of injectable contraceptive       Relevant Medications   medroxyPROGESTERone (DEPO-PROVERA) 150 MG/ML injection      Meds ordered this encounter  Medications  . medroxyPROGESTERone (DEPO-PROVERA) 150 MG/ML injection    Sig: Inject 1 mL (150 mg total) into the muscle every 3 (three) months.    Dispense:  1 mL    Refill:  3      Dr. Elizabeth Sauereanna Jones [facility]  09/08/16

## 2016-09-08 ENCOUNTER — Other Ambulatory Visit: Payer: Self-pay | Admitting: Family Medicine

## 2016-09-12 LAB — PAP IG AND HPV HIGH-RISK
HPV, HIGH-RISK: NEGATIVE
PAP SMEAR COMMENT: 0

## 2016-09-12 LAB — PLEASE NOTE

## 2016-11-03 ENCOUNTER — Ambulatory Visit
Admission: EM | Admit: 2016-11-03 | Discharge: 2016-11-03 | Disposition: A | Discharge status: Home / Self Care | Payer: No Typology Code available for payment source | Attending: Family Medicine | Admitting: Family Medicine

## 2016-11-03 ENCOUNTER — Encounter: Payer: Self-pay | Admitting: *Deleted

## 2016-11-03 DIAGNOSIS — W57XXXA Bitten or stung by nonvenomous insect and other nonvenomous arthropods, initial encounter: Secondary | ICD-10-CM | POA: Diagnosis not present

## 2016-11-03 DIAGNOSIS — L539 Erythematous condition, unspecified: Secondary | ICD-10-CM

## 2016-11-03 MED ORDER — DOXYCYCLINE HYCLATE 100 MG PO CAPS: 100.0000 mg | ORAL_CAPSULE | Freq: Two times a day (BID) | ORAL | 0 refills | Status: DC

## 2016-11-03 MED ORDER — MUPIROCIN 2 % EX OINT: 1.0000 "application " | TOPICAL_OINTMENT | Freq: Three times a day (TID) | CUTANEOUS | 0 refills | Status: DC

## 2016-11-03 NOTE — ED Provider Notes (Signed)
[facility]    CSN: [id number] Arrival date & time: 11/03/16  1709     History   Chief Complaint Chief Complaint  Patient presents with  . Insect Bite    HPI Jamie Dudley is a 36 y.o. female.   HPI  Is a 36 year old female who presents with a bug bite in the medial right popliteal area. She states that she was cleaning her father in law's house and Sunday and awoke Monday morning with a bug bite. She did not appearance any pain and did not notice a bug on her leg. She's been using Neosporin and a trying not to scratch the area. She is concerned regarding the redness around the actual bite itself.         Past Medical History:  Diagnosis Date  . Atypical squamous cells of undetermined significance (ASC-US) on cervical Pap smear   . Depression   . Sinusitis   . Skin-picking disorder     Patient Active Problem List   Diagnosis Date Noted  . Nausea vomiting and diarrhea 09/04/2016  . Acute cystitis without hematuria 09/04/2016    Past Surgical History:  Procedure Laterality Date  . COLPOSCOPY    . WISDOM TOOTH EXTRACTION      OB History    No data available       Home Medications    Prior to Admission medications   Medication Sig Start Date End Date Taking? Authorizing Provider  loratadine (CLARITIN) 10 MG tablet Take 1 tablet (10 mg total) by mouth daily. 06/14/16   Duanne Limerick, MD  medroxyPROGESTERone (DEPO-PROVERA) 150 MG/ML injection Inject 1 mL (150 mg total) into the muscle every 3 (three) months. 09/07/16   Duanne Limerick, MD  mupirocin ointment (BACTROBAN) 2 % Apply 1 application topically 3 (three) times daily. 11/03/16   Lutricia Feil, PA-C  sertraline (ZOLOFT) 25 MG tablet Take 1 tablet (25 mg total) by mouth daily. Patient not taking: Reported on 09/07/2016 08/02/16 08/02/17  Patrick North, MD    Family History Family History  Problem Relation Age of Onset  . Heart disease Paternal Grandfather     Social History Social  History  Substance Use Topics  . Smoking status: Never Smoker  . Smokeless tobacco: Never Used  . Alcohol use Yes     Allergies   Patient has no known allergies.   Review of Systems Review of Systems  Constitutional: Negative for activity change, chills, fatigue and fever.  Skin: Positive for color change and wound.  All other systems reviewed and are negative.    Physical Exam Triage Vital Signs ED Triage Vitals  Enc Vitals Group     BP 11/03/16 1723 127/80     Pulse Rate 11/03/16 1723 81     Resp 11/03/16 1723 16     Temp 11/03/16 1723 98.9 F (37.2 C)     Temp Source 11/03/16 1723 Oral     SpO2 11/03/16 1723 100 %     Weight 11/03/16 1716 130 lb (59 kg)     Height 11/03/16 1716 5\' 5"  (1.651 m)     Head Circumference --      Peak Flow --      Pain Score 11/03/16 1717 2     Pain Loc --      Pain Edu? --      Excl. in GC? --    No data found.   Updated Vital Signs BP 127/80 (BP Location: Left Arm)  Pulse 81   Temp 98.9 F (37.2 C) (Oral)   Resp 16   Ht 5\' 5"  (1.651 m)   Wt 130 lb (59 kg)   LMP 02/04/2016   SpO2 100%   BMI 21.63 kg/m   Visual Acuity Right Eye Distance:   Left Eye Distance:   Bilateral Distance:    Right Eye Near:   Left Eye Near:    Bilateral Near:     Physical Exam  Constitutional: She is oriented to person, place, and time. She appears well-developed and well-nourished. No distress.  HENT:  Head: Normocephalic.  Eyes: Pupils are equal, round, and reactive to light. Right eye exhibits no discharge. Left eye exhibits no discharge.  Neck: Normal range of motion.  Musculoskeletal: Normal range of motion.  Neurological: She is alert and oriented to person, place, and time.  Skin: Skin is warm and dry. She is not diaphoretic. There is erythema.  There is a small raised erythematous wheal in the medial aspect of her right popliteal fossa. There is no excoriations. Surrounding erythema is mildly blanchable. There is no drainage  present. Is full range of motion of her knee. She has other edema superficially that is from rubbing causing the itching. Does have no excoriations present. Refer to photograph for details  Psychiatric: She has a normal mood and affect. Her behavior is normal. Judgment and thought content normal.  Nursing note and vitals reviewed.        UC Treatments / Results  Labs (all labs ordered are listed, but only abnormal results are displayed) Labs Reviewed - No data to display  EKG  EKG Interpretation None       Radiology No results found.  Procedures Procedures (including critical care time)  Medications Ordered in UC Medications - No data to display   Initial Impression / Assessment and Plan / UC Course  I have reviewed the triage vital signs and the nursing notes.  Pertinent labs & imaging results that were available during my care of the patient were reviewed by me and considered in my medical decision making (see chart for details).      discontinue use of the Neosporin ointment. Start using Bactroban ointment 3 times daily to the area. Use Benadryl at night for itching relief. If not improving in several days she should follow-up with Dr. Yetta Barre her primary care physician. He may use cool compresses to help reduce the itching.   Final Clinical Impressions(s) / UC Diagnoses   Final diagnoses:  Insect bite, initial encounter    New Prescriptions New Prescriptions   MUPIROCIN OINTMENT (BACTROBAN) 2 %    Apply 1 application topically 3 (three) times daily.   Doxycycline 500 BID x 5 days  Controlled Substance Prescriptions [place] Controlled Substance Registry consulted? Not Applicable   Lutricia Feil, PA-C 11/03/16 1813

## 2016-11-03 NOTE — ED Triage Notes (Signed)
Right back leg possible bug bite. Pt noticed swelling, itching and joint pain Monday morning

## 2017-01-24 ENCOUNTER — Encounter: Payer: Self-pay | Admitting: Certified Nurse Midwife

## 2017-01-24 ENCOUNTER — Other Ambulatory Visit: Payer: Self-pay

## 2017-01-24 ENCOUNTER — Ambulatory Visit (INDEPENDENT_AMBULATORY_CARE_PROVIDER_SITE_OTHER): Payer: No Typology Code available for payment source | Admitting: Certified Nurse Midwife

## 2017-01-24 VITALS — BP 139/93 | HR 83 | Ht 65.0 in | Wt 141.0 lb

## 2017-01-24 DIAGNOSIS — N912 Amenorrhea, unspecified: Secondary | ICD-10-CM

## 2017-01-24 DIAGNOSIS — N898 Other specified noninflammatory disorders of vagina: Secondary | ICD-10-CM | POA: Diagnosis not present

## 2017-01-24 MED ORDER — MEDROXYPROGESTERONE ACETATE 10 MG PO TABS
10.0000 mg | ORAL_TABLET | Freq: Every day | ORAL | 0 refills | Status: DC
Start: 2017-01-24 — End: 2017-02-15

## 2017-01-24 NOTE — Progress Notes (Signed)
GYN ENCOUNTER NOTE  Subjective:       Jamie Dudley is a 36 y.o. G0P0000 female is here for gynecologic evaluation of the following issues:  1. Discuss pregnancy.  She stopped taking Depo in February 2018. She states that she had been on it for 1-1 1/2 years. Prior she had regular periods that last 5-7 days. Since stopping she has had one period in September and has had  spotting everyday. She denies any pain.      Gynecologic History Patient's last menstrual period was 12/07/2016 (exact date). Contraception: none Last Pap: 09/08/2016 Results were: normal Last mammogram: N/A.   Obstetric History OB History  Gravida Para Term Preterm AB Living  0 0 0 0 0 0  SAB TAB Ectopic Multiple Live Births  0 0 0 0 0        Past Medical History:  Diagnosis Date  . Atypical squamous cells of undetermined significance (ASC-US) on cervical Pap smear   . Depression   . Sinusitis   . Skin-picking disorder     Past Surgical History:  Procedure Laterality Date  . COLPOSCOPY    . WISDOM TOOTH EXTRACTION      Current Outpatient Medications on File Prior to Visit  Medication Sig Dispense Refill  . mupirocin ointment (BACTROBAN) 2 % Apply 1 application topically 3 (three) times daily. 22 g 0   No current facility-administered medications on file prior to visit.     No Known Allergies  Social History   Socioeconomic History  . Marital status: Single    Spouse name: Not on file  . Number of children: Not on file  . Years of education: Not on file  . Highest education level: Not on file  Social Needs  . Financial resource strain: Not on file  . Food insecurity - worry: Not on file  . Food insecurity - inability: Not on file  . Transportation needs - medical: Not on file  . Transportation needs - non-medical: Not on file  Occupational History  . Not on file  Tobacco Use  . Smoking status: Never Smoker  . Smokeless tobacco: Never Used  Substance and Sexual Activity  . Alcohol use: Yes   . Drug use: No  . Sexual activity: Yes  Other Topics Concern  . Not on file  Social History Narrative  . Not on file    Family History  Problem Relation Age of Onset  . Heart disease Paternal Grandfather     The following portions of the patient's history were reviewed and updated as appropriate: allergies, current medications, past family history, past medical history, past social history, past surgical history and problem list.  Review of Systems Review of Systems - Negative except as mentioned in HPI Review of Systems - General ROS: negative for - chills, fatigue, fever, hot flashes, malaise or night sweats Hematological and Lymphatic ROS: negative for - bleeding problems or swollen lymph nodes Gastrointestinal ROS: negative for - abdominal pain, blood in stools, change in bowel habits and nausea/vomiting Musculoskeletal ROS: negative for - joint pain, muscle pain or muscular weakness Genito-Urinary ROS: negative for - , dysmenorrhea, dyspareunia, dysuria, genital discharge, genital ulcers, hematuria, incontinence, nocturia or pelvic pain. Positive for change in menstrual cycle  Objective:   BP (!) 139/93   Pulse 83   Ht 5\' 5"  (1.651 m)   Wt 141 lb (64 kg)   LMP 12/07/2016 (Exact Date) Comment: previously on depo-provera  BMI 23.46 kg/m  CONSTITUTIONAL: Well-developed, well-nourished female  in no acute distress.  HENT:  Normocephalic, atraumatic.  NECK: Normal range of motion, supple, SKIN: Skin is warm and dry. No rash noted. Not diaphoretic. No erythema. No pallor. NEUROLGIC: Alert and oriented to person, place, and time.  PSYCHIATRIC: Normal mood and affect. Normal behavior. Normal judgment and thought content. CARDIOVASCULAR:Not Examined RESPIRATORY: Not Examined BREASTS: Not Examined ABDOMEN: Soft, non distended; Non tender.  No Organomegaly. PELVIC:  External Genitalia: Normal  BUS: Normal  Vagina: Normal, blood present  Cervix: Normal  Uterus: Normal size,  shape,consistency, mobile  Adnexa: Normal  RV: Normal   Bladder: Nontender MUSCULOSKELETAL: Normal range of motion. No tenderness.  No cyanosis, clubbing, or edema.   Assessment:   1. Amenorrhea  - FSH/LH - Glucose, random - Prolactin - Testosterone - TSH - US PELVIS (TRANSABDOMINAL ONLY); Future   Vaginal odor Nuswab BV/Yeast   Plan:   She will schedule an ultrasound . Provera challenge test ordered. Discussed return of fertility can take up to a year after birth control use but given her age that we could start the process of evaluation. Additionally, we discussed the menstrual cycle and ovulation for timing of intercourse. She verbalizes understanding. She will follow up in 2 wks after completion of the provera challenge.   I attest more than 50% of visit spent reviewing history , discussing the menstrual cycle, and fertility.   Jamie Dudley, CNM

## 2017-01-24 NOTE — Patient Instructions (Signed)
Infertilidad (Infertility) La infertilidad es no poder lograr el embarazo (concebir) despus de un ao de mantener relaciones sexuales regularmente sin usar ningn mtodo de control de la natalidad. La infertilidad tambin puede significar que una mujer no puede llevar el embarazo a trmino. Tanto hombres como mujeres pueden tener problemas de fertilidad. CULES SON LAS CAUSAS DE LA INFERTILIDAD? Cules son las causas de la infertilidad en las mujeres? Hay muchas causas posibles de infertilidad en las mujeres. En algunas mujeres, no hay ninguna causa aparente de infertilidad (infertilidad idioptica). La infertilidad tambin puede estar vinculada a ms de una causa. Los problemas de infertilidad en las mujeres pueden deberse a problemas en el ciclo menstrual o los rganos reproductivos, ciertas afecciones mdicas y factores relacionados con la edad y el estilo de vida.  Los problemas en el ciclo menstrual pueden interferir con los ovarios que producen los vulos (ovulacin). Esto puede causar dificultad para quedar embarazada e incluye tener un ciclo menstrual muy largo, muy corto o irregular.  Los problemas relacionados con los rganos reproductivos incluyen lo siguiente: ? Un cuello de tero anormalmente angosto o que no permanece cerrado durante el embarazo. ? Una obstruccin en las trompas de Falopio. ? Un tero de forma anormal. ? Fibromas uterinos. Estos son masas de tejido (tumores) que pueden desarrollarse en el tero.  Las afecciones mdicas que pueden afectar la fertilidad en las mujeres incluyen lo siguiente: ? Sndrome de ovario poliqustico (SOP). Este es un trastorno hormonal que produce pequeos quistes en los ovarios y es la causa ms frecuente de infertilidad en las mujeres. ? Endometriosis. Es una enfermedad en la que el tejido que recubre el tero (endometrio) crece fuera de su ubicacin normal. ? Insuficiencia ovrica primaria. Esto es cuando los ovarios dejan de producir  vulos y hormonas antes de los 40aos de edad. ? Enfermedades de transmisin sexual (ETS), como la clamidia o la gonorrea. Estas infecciones pueden provocar fibrosis en las trompas de Falopio, lo que disminuye la probabilidad de que los vulos lleguen al tero. ? Trastornos autoinmunitarios. Estas son afecciones en las que el sistema inmunitario ataca las clulas normales y saludables. ? Desequilibrios hormonales.  Otros factores incluyen los siguientes: ? La edad. La fertilidad en las mujeres declina con la edad, especialmente despus de los 35aos. ? Tener bajo peso o sobrepeso. ? Beber alcohol en exceso. ? Consumir drogas. ? Hacer ejercicio en exceso. ? La exposicin a toxinas ambientales, como la radiacin, los plaguicidas y ciertos qumicos. Cules son las causas de la infertilidad en los hombres? Hay muchas causas de infertilidad en los hombres. La infertilidad puede estar vinculada a ms de una causa. Los problemas de infertilidad en los hombres pueden deberse a problemas con los espermatozoides o los rganos reproductivos, ciertas afecciones mdicas y factores relacionados con la edad y el estilo de vida. Algunos hombres tienen infertilidad idioptica.  Problemas con los espermatozoides. La infertilidad puede ser consecuencia de un problema para producir lo siguiente: ? Suficientes espermatozoides (bajo recuento de espermatozoides). ? Suficientes espermatozoides con forma normal (morfologa de los espermatozoides). ? Espermatozoides que puedan llegar al vulo (motilidad deficiente).  Las causas de la infertilidad tambin incluyen las siguientes: ? Un problema con las hormonas. ? Venas agrandadas (varicocele), quistes (espermatocele) o tumores en los testculos. ? Disfuncin sexual. ? Una lesin en los testculos. ? Un defecto de nacimiento, como no tener los conductos que transportan los espermatozoides (conductos deferentes).  Algunas de las afecciones mdicas que pueden  afectar la fertilidad en los hombres son   las siguientes: ? Diabetes. ? Tratamiento contra el cncer, como la radioterapia o la quimioterapia. ? El sndrome de Klinefelter. Este es un trastorno gentico hereditario. ? Problemas de tiroides, como una tiroides hipoactiva o hiperactiva. ? Fibrosis qustica. ? Enfermedades de transmisin sexual.  Otros factores incluyen los siguientes: ? La edad. La fertilidad en los hombres declina con la edad. ? Beber alcohol en exceso. ? Consumir drogas. ? La exposicin a toxinas ambientales, como la radiacin, los plaguicidas y el plomo. CULES SON LOS SNTOMAS DE LA INFERTILIDAD? El nico signo de infertilidad es no poder lograr el embarazo despus de un ao de mantener relaciones sexuales regularmente sin usar ningn mtodo de control de la natalidad. CMO SE DIAGNOSTICA LA INFERTILIDAD? Para recibir un diagnstico de infertilidad, ambos integrantes de la pareja se harn a un examen fsico. Tambin se analizar en detalle la historia clnica y sexual de ambos. Si no hay una razn evidente de infertilidad, se harn estudios adicionales. Qu estudios se harn las mujeres? Las mujeres primero pueden hacerse estudios para controlar si ovulan todos los meses. Estos estudios pueden incluir lo siguiente:  Anlisis de sangre para controlar los niveles hormonales.  Una ecografa de los ovarios. Este estudio detecta posibles problemas en los ovarios.  Toma de una pequea muestra de tejido que recubre el tero para examinarla en el microscopio (biopsia de endometrio). Las mujeres que ovulan pueden realizarse estudios adicionales. Estos pueden incluir los siguientes:  Histerosalpingografa. ? Es una radiografa de las trompas de Falopio y el tero despus de inyectar un tipo especfico de sustancia de contraste. ? Esta prueba puede mostrar la forma del tero y si las trompas de Falopio estn abiertas.  Laparoscopia. ? En este estudio, se utiliza un tubo que  emite luz (laparoscopio) para detectar problemas en las trompas de Falopio y otros rganos femeninos.  Ecografa transvaginal. ? Este es un estudio de diagnstico por imgenes que determina si hay anomalas en el tero y los ovarios. ? El mdico puede utilizar este estudio para contar la cantidad de folculos en los ovarios.  Histeroscopa. ? En este estudio, se usa un tubo que emite luz para examinar el cuello y el interior del tero. ? Se realiza para detectar anomalas en el interior del tero. Qu estudios se harn los hombres? Los estudios para determinar la infertilidad en los hombres incluyen los siguientes:  Anlisis de semen para controlar el recuento, la morfologa y la motilidad de los espermatozoides.  Anlisis de sangre para controlar los niveles hormonales.  Toma de una pequea muestra de tejido del interior de un testculo (biopsia). La muestra se examina en el microscopio.  Anlisis de sangre para detectar anomalas genticas (pruebas genticas). CUL ES EL TRATAMIENTO PARA LA INFERTILIDAD EN LAS MUJERES? El tratamiento depende de la causa de la infertilidad. En la mayora de los casos, la infertilidad en las mujeres se trata con medicamentos o ciruga.  Las mujeres pueden tomar medicamentos para: ? Corregir problemas de ovulacin. ? Tratar otras enfermedades, como el SOP.  Se puede realizar una ciruga para lo siguiente: ? Reparar daos en los ovarios, las trompas de Falopio, el cuello del tero o el tero. ? Extraer tumores del tero. ? Extraer tejido cicatricial del tero, la pelvis u otros rganos femeninos. CUL ES EL TRATAMIENTO PARA LA INFERTILIDAD EN LOS HOMBRES? El tratamiento depende de la causa de la infertilidad. En la mayora de los casos, la infertilidad en los hombres se trata con medicamentos o ciruga.  Los hombres pueden tomar   medicamentos para: ? Corregir problemas hormonales. ? Tratar otras enfermedades. ? Tratar la disfuncin sexual.  Se  puede realizar una ciruga para lo siguiente: ? Eliminar obstrucciones en el tracto reproductivo. ? Corregir otros problemas estructurales del tracto reproductivo. QU SON LAS TCNICAS DE REPRODUCCIN ASISTIDA? Las tcnicas de reproduccin asistida (TRA) se refieren a todos los tratamientos y procedimientos que unen vulos y espermatozoides fuera del cuerpo para ayudar a una pareja a concebir. Las TRA se suelen combinar con medicamentos para la fertilidad que estimulan la ovulacin. En algunos casos, las TRA se llevan a cabo con vulos extrados del cuerpo de otra mujer (vulos de donante) o con vulos previamente fertilizados y congelados (embriones). Hay diferentes tipos de TRA. Estos incluyen los siguientes:  Inseminacin intrauterina (IIU). ? En este procedimiento, los espermatozoides se colocan directamente en el tero de la mujer con un tubo largo y delgado. ? Esta tcnica puede ser la ms efectiva para la infertilidad causada por problemas con los espermatozoides, incluidos el bajo recuento y la baja motilidad. ? Se puede utilizar en combinacin con medicamentos para la fertilidad.  Fertilizacin in vitro (FIV). ? Por lo general, se utiliza esta tcnica cuando las trompas de Falopio de la mujer estn obstruidas o si el hombre tiene un bajo recuento de espermatozoides. ? Los medicamentos para la infertilidad estimulan los ovarios para que produzcan varios vulos. Cuando estn maduros, estos vulos se extraen del cuerpo y se unen a los espermatozoides para ser fertilizados. ? Luego los vulos fertilizados se colocan en el tero de la mujer. Esta informacin no tiene como fin reemplazar el consejo del mdico. Asegrese de hacerle al mdico cualquier pregunta que tenga. Document Released: 03/20/2007 Document Revised: 03/21/2014 Document Reviewed: 11/13/2013 Elsevier Interactive Patient Education  2018 [employer]  

## 2017-01-26 LAB — FSH/LH
FSH: 4.7 m[IU]/mL
LH: 3.5 m[IU]/mL

## 2017-01-26 LAB — TSH: TSH: 2.83 u[IU]/mL (ref 0.450–4.500)

## 2017-01-26 LAB — TESTOSTERONE: TESTOSTERONE: 10 ng/dL (ref 8–48)

## 2017-01-26 LAB — PROLACTIN: Prolactin: 9 ng/mL (ref 4.8–23.3)

## 2017-01-26 LAB — GLUCOSE, RANDOM: Glucose: 84 mg/dL (ref 65–99)

## 2017-01-27 ENCOUNTER — Encounter: Payer: Self-pay | Admitting: Certified Nurse Midwife

## 2017-01-27 LAB — NUSWAB BV AND CANDIDA, NAA
Candida albicans, NAA: NEGATIVE
Candida glabrata, NAA: NEGATIVE

## 2017-01-30 ENCOUNTER — Other Ambulatory Visit: Payer: Self-pay

## 2017-02-06 ENCOUNTER — Ambulatory Visit (INDEPENDENT_AMBULATORY_CARE_PROVIDER_SITE_OTHER): Payer: No Typology Code available for payment source

## 2017-02-06 DIAGNOSIS — N912 Amenorrhea, unspecified: Secondary | ICD-10-CM | POA: Diagnosis not present

## 2017-02-07 ENCOUNTER — Encounter: Payer: Self-pay | Admitting: Certified Nurse Midwife

## 2017-02-15 ENCOUNTER — Encounter: Payer: Self-pay | Admitting: Emergency Medicine

## 2017-02-15 ENCOUNTER — Other Ambulatory Visit: Payer: Self-pay

## 2017-02-15 ENCOUNTER — Ambulatory Visit
Admission: EM | Admit: 2017-02-15 | Discharge: 2017-02-15 | Disposition: A | Discharge status: Home / Self Care | Payer: No Typology Code available for payment source | Attending: Family Medicine | Admitting: Family Medicine

## 2017-02-15 DIAGNOSIS — J029 Acute pharyngitis, unspecified: Secondary | ICD-10-CM | POA: Diagnosis not present

## 2017-02-15 DIAGNOSIS — J069 Acute upper respiratory infection, unspecified: Secondary | ICD-10-CM | POA: Diagnosis not present

## 2017-02-15 DIAGNOSIS — R05 Cough: Secondary | ICD-10-CM | POA: Diagnosis not present

## 2017-02-15 LAB — RAPID STREP SCREEN (MED CTR MEBANE ONLY): STREPTOCOCCUS, GROUP A SCREEN (DIRECT): NEGATIVE

## 2017-02-15 NOTE — ED Triage Notes (Signed)
Patient c/o sore throat and congestion for the past 2 days.  Patient denies fevers.

## 2017-02-15 NOTE — Discharge Instructions (Signed)
Rest. Drink plenty of fluids.  ° °Follow up with your primary care physician this week as needed. Return to Urgent care for new or worsening concerns.  ° °

## 2017-02-15 NOTE — ED Provider Notes (Signed)
[facility] ____________________________________________  Time seen: Approximately 1:20 PM  I have reviewed the triage vital signs and the nursing notes.  HISTORY  Chief Complaint Sore Throat  HPI Jamie Dudley is a 36 y.o. female presents for evaluation of runny nose, nasal congestion, cough, and sore throat is been present for the last 2 days.  States sore throat mild to moderate.  States sinus pressure and sinus congestion.  States cough has intermittently woken her up, but states home over-the-counter cough medicine that has worked well.  States has been using cough and congestion accommodation medicines which helps but, no resolution.  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  Reports continues to overall eat and drink well.  Denies known fevers.  Denies aggravating or alleviating factors otherwise. Denies chest pain, shortness of breath, abdominal pain, or rash. Denies recent sickness. Denies recent antibiotic use.  Jamie LimerickJones, [name], MD: PCP No LMP recorded. Patient is not currently having periods (Reason: Irregular Periods).DEnies pregnancy   Past Medical History:  Diagnosis Date  . Atypical squamous cells of undetermined significance (ASC-US) on cervical Pap smear   . Depression   . Sinusitis   . Skin-picking disorder     Patient Active Problem List   Diagnosis Date Noted  . Nausea vomiting and diarrhea 09/04/2016  . Acute cystitis without hematuria 09/04/2016    Past Surgical History:  Procedure Laterality Date  . COLPOSCOPY    . WISDOM TOOTH EXTRACTION       No current facility-administered medications for this encounter.  No current outpatient medications on file.  Allergies Patient has no known allergies.  Family History  Problem Relation Age of Onset  . Heart disease Paternal Grandfather   . Hypertension Mother     Social History Social History   Tobacco Use    . Smoking status: Never Smoker  . Smokeless tobacco: Never Used  Substance Use Topics  . Alcohol use: Yes  . Drug use: No    Review of Systems Constitutional: No fever/chills ENT: as above. Cardiovascular: Denies chest pain. Respiratory: Denies shortness of breath. Gastrointestinal: No abdominal pain.   Musculoskeletal: Negative for back pain. Skin: Negative for rash.   ____________________________________________   PHYSICAL EXAM:  VITAL SIGNS: ED Triage Vitals  Enc Vitals Group     BP 02/15/17 1254 (!) 141/97     Pulse Rate 02/15/17 1254 84     Resp 02/15/17 1254 14     Temp 02/15/17 1254 98.1 F (36.7 Dudley)     Temp Source 02/15/17 1254 Oral     SpO2 02/15/17 1254 99 %     Weight 02/15/17 1251 130 lb (59 kg)     Height 02/15/17 1251 5\' 5"  (1.651 m)     Head Circumference --      Peak Flow --      Pain Score 02/15/17 1251 2     Pain Loc --      Pain Edu? --      Excl. in GC? --     Constitutional: Alert and oriented. Well appearing and in no acute distress. Eyes: Conjunctivae are normal.  Head: Atraumatic. No sinus tenderness to palpation. No swelling. No erythema.  Ears: no erythema, normal TMs bilaterally.   Nose:Nasal congestion   Mouth/Throat: Mucous membranes are moist. Mild pharyngeal erythema. No tonsillar swelling or exudate.  Neck: No stridor.  No cervical spine tenderness to palpation. Hematological/Lymphatic/Immunilogical: No  cervical lymphadenopathy. Cardiovascular: Normal rate, regular rhythm. Grossly normal heart sounds.  Good peripheral circulation. Respiratory: Normal respiratory effort.  No retractions. No wheezes, rales or rhonchi. Good air movement.  Musculoskeletal: Ambulatory with steady gait. No cervical, thoracic or lumbar tenderness to palpation. Neurologic:  Normal speech and language. No gait instability. Skin:  Skin appears warm, dry and intact. No rash noted. Psychiatric: Mood and affect are normal. Speech and behavior are  normal.  ___________________________________________   LABS (all labs ordered are listed, but only abnormal results are displayed)  Labs Reviewed  RAPID STREP SCREEN (NOT AT [facility])  CULTURE, GROUP A STREP [facility])     PROCEDURES Procedures     INITIAL IMPRESSION / ASSESSMENT AND PLAN / ED COURSE  Pertinent labs & imaging results that were available during my care of the patient were reviewed by me and considered in my medical decision making (see chart for details).  Well-appearing patient.  No acute distress.  Quick strep negative, will culture.  Suspect viral upper respiratory infection.  Encourage rest, fluids, supportive care.  Patient declines need for prescription medicine and states will take over-the-counter as needed.  Work note given for today and tomorrow.  Discussed follow up with Primary care physician this week. Discussed follow up and return parameters including no resolution or any worsening concerns. Patient verbalized understanding and agreed to plan.   ____________________________________________   FINAL CLINICAL IMPRESSION(S) / ED DIAGNOSES  Final diagnoses:  Pharyngitis, unspecified etiology  Upper respiratory tract infection, unspecified type     ED Discharge Orders    None       Note: This dictation was prepared with Dragon dictation along with smaller phrase technology. Any transcriptional errors that result from this process are unintentional.         Renford DillsMiller, Tayen Narang, NP 02/15/17 1453

## 2017-02-18 LAB — CULTURE, GROUP A STREP (THRC)

## 2017-03-22 ENCOUNTER — Encounter: Payer: Self-pay | Admitting: Certified Nurse Midwife

## 2017-03-24 ENCOUNTER — Encounter: Payer: Self-pay | Admitting: Certified Nurse Midwife

## 2017-03-24 ENCOUNTER — Telehealth: Payer: No Typology Code available for payment source | Admitting: Nurse Practitioner

## 2017-03-24 ENCOUNTER — Ambulatory Visit (INDEPENDENT_AMBULATORY_CARE_PROVIDER_SITE_OTHER): Payer: No Typology Code available for payment source | Admitting: Certified Nurse Midwife

## 2017-03-24 VITALS — BP 143/92 | HR 72 | Ht 65.0 in | Wt 136.2 lb

## 2017-03-24 DIAGNOSIS — N970 Female infertility associated with anovulation: Secondary | ICD-10-CM

## 2017-03-24 DIAGNOSIS — J0100 Acute maxillary sinusitis, unspecified: Secondary | ICD-10-CM

## 2017-03-24 MED ORDER — AMOXICILLIN-POT CLAVULANATE 875-125 MG PO TABS
1.0000 | ORAL_TABLET | Freq: Two times a day (BID) | ORAL | 0 refills | Status: DC
Start: 2017-03-24 — End: 2017-04-11

## 2017-03-24 MED ORDER — MEDROXYPROGESTERONE ACETATE 10 MG PO TABS: 10.0000 mg | ORAL_TABLET | Freq: Every day | ORAL | 11 refills | Status: DC

## 2017-03-24 MED ORDER — CLOMIPHENE CITRATE 50 MG PO TABS: 50.0000 mg | ORAL_TABLET | Freq: Every day | ORAL | 3 refills | Status: AC

## 2017-03-24 NOTE — Patient Instructions (Signed)
Clomiphene tablets What is this medicine? CLOMIPHENE (KLOE mi feen) is a fertility drug that increases the chance of pregnancy. It helps women ovulate (produce a mature egg) during their cycle. This medicine may be used for other purposes; ask your health care provider or pharmacist if you have questions. COMMON BRAND NAME(S): Clomid, Serophene What should I tell my health care provider before I take this medicine? They need to know if you have any of these conditions: -adrenal gland disease -blood vessel disease or blood clots -cyst on the ovary -endometriosis -liver disease -ovarian cancer -pituitary gland disease -vaginal bleeding that has not been evaluated -an unusual or allergic reaction to clomiphene, other medicines, foods, dyes, or preservatives -pregnant (should not be used if you are already pregnant) -breast-feeding How should I use this medicine? Take this medicine by mouth with a glass of water. Follow the directions on the prescription label. Take exactly as directed for the exact number of days prescribed. Take your doses at regular intervals. Most women take this medicine for a 5 day period, but the length of treatment may be adjusted. Your doctor will give you a start date for this medication and will give you instructions on proper use. Do not take your medicine more often than directed. Talk to your pediatrician regarding the use of this medicine in children. Special care may be needed. Overdosage: If you think you have taken too much of this medicine contact a poison control center or emergency room at once. NOTE: This medicine is only for you. Do not share this medicine with others. What if I miss a dose? If you miss a dose, take it as soon as you can. If it is almost time for your next dose, take only that dose. Do not take double or extra doses. What may interact with this medicine? -herbal or dietary supplements, like blue cohosh, black cohosh, chasteberry, or  DHEA -prasterone This list may not describe all possible interactions. Give your health care provider a list of all the medicines, herbs, non-prescription drugs, or dietary supplements you use. Also tell them if you smoke, drink alcohol, or use illegal drugs. Some items may interact with your medicine. What should I watch for while using this medicine? Make sure you understand how and when to use this medicine. You need to know when you are ovulating and when to have sexual intercourse. This will increase the chance of a pregnancy. Visit your doctor or health care professional for regular checks on your progress. You may need tests to check the hormone levels in your blood or you may have to use home-urine tests to check for ovulation. Try to keep any appointments. Compared to other fertility treatments, this medicine does not greatly increase your chances of having multiple babies. An increased chance of having twins may occur in roughly 5 out of every 100 women who take this medication. Stop taking this medicine at once and contact your doctor or health care professional if you think you are pregnant. This medicine is not for long-term use. Most women that benefit from this medicine do so within the first three cycles (months). Your doctor or health care professional will monitor your condition. This medicine is usually used for a total of 6 cycles of treatment. You may get drowsy or dizzy. Do not drive, use machinery, or do anything that needs mental alertness until you know how this drug affects you. Do not stand or sit up quickly. This reduces the risk of dizzy or   fainting spells. Drinking alcoholic beverages or smoking tobacco may decrease your chance of becoming pregnant. Limit or stop alcohol and tobacco use during your fertility treatments. What side effects may I notice from receiving this medicine? Side effects that you should report to your doctor or health care professional as soon as  possible: -allergic reactions like skin rash, itching or hives, swelling of the face, lips, or tongue -breathing problems -changes in vision -fluid retention -nausea, vomiting -pelvic pain or bloating -severe abdominal pain -sudden weight gain Side effects that usually do not require medical attention (report to your doctor or health care professional if they continue or are bothersome): -breast discomfort -hot flashes -mild pelvic discomfort -mild nausea This list may not describe all possible side effects. Call your doctor for medical advice about side effects. You may report side effects to FDA at 1-800-FDA-1088. Where should I keep my medicine? Keep out of the reach of children. Store at room temperature between 15 and 30 degrees C (59 and 86 degrees F). Protect from heat, light, and moisture. Throw away any unused medicine after the expiration date. NOTE: This sheet is a summary. It may not cover all possible information. If you have questions about this medicine, talk to your doctor, pharmacist, or health care provider.  2018 Elsevier/Gold Standard (2007-06-11 22:21:06)  

## 2017-03-24 NOTE — Progress Notes (Signed)
GYN ENCOUNTER NOTE  Subjective:       Jamie Dudley is a 37 y.o. G0P0000 female is here for gynecologic evaluation of the following issues:  1. Follow up visit for provera challenge test. She had a positive result.      Gynecologic History Patient's last menstrual period was 03/17/2017 (approximate). Contraception: none Last Pap: 09/08/16. Results were: normal Last mammogram: N/A. R  Obstetric History OB History  Gravida Para Term Preterm AB Living  0 0 0 0 0 0  SAB TAB Ectopic Multiple Live Births  0 0 0 0 0        Past Medical History:  Diagnosis Date  . Atypical squamous cells of undetermined significance (ASC-US) on cervical Pap smear   . Depression   . Sinusitis   . Skin-picking disorder     Past Surgical History:  Procedure Laterality Date  . COLPOSCOPY    . WISDOM TOOTH EXTRACTION      Current Outpatient Medications on File Prior to Visit  Medication Sig Dispense Refill  . sodium chloride (OCEAN) 0.65 % SOLN nasal spray Place 1 spray into both nostrils as needed for congestion.     No current facility-administered medications on file prior to visit.     No Known Allergies  Social History   Socioeconomic History  . Marital status: Single    Spouse name: Not on file  . Number of children: Not on file  . Years of education: Not on file  . Highest education level: Not on file  Social Needs  . Financial resource strain: Not on file  . Food insecurity - worry: Not on file  . Food insecurity - inability: Not on file  . Transportation needs - medical: Not on file  . Transportation needs - non-medical: Not on file  Occupational History  . Not on file  Tobacco Use  . Smoking status: Never Smoker  . Smokeless tobacco: Never Used  Substance and Sexual Activity  . Alcohol use: Yes    Comment: rare  . Drug use: No  . Sexual activity: Yes    Birth control/protection: None  Other Topics Concern  . Not on file  Social History Narrative  . Not on file     Family History  Problem Relation Age of Onset  . Heart disease Paternal Grandfather   . Hypertension Mother     The following portions of the patient's history were reviewed and updated as appropriate: allergies, current medications, past family history, past medical history, past social history, past surgical history and problem list.  Review of Systems Review of Systems - Negative except as mentioned in HPI Review of Systems - General ROS: negative for - chills, fatigue, fever, hot flashes, malaise or night sweats Hematological and Lymphatic ROS: negative for - bleeding problems or swollen lymph nodes Gastrointestinal ROS: negative for - abdominal pain, blood in stools, change in bowel habits and nausea/vomiting Musculoskeletal ROS: negative for - joint pain, muscle pain or muscular weakness Genito-Urinary ROS: negative for -  dysmenorrhea, dyspareunia, dysuria, genital discharge, genital ulcers, hematuria, incontinence,heavy menses, nocturia or pelvic pain.  Objective:   BP (!) 143/92   Pulse 72   Ht 5' 5"  (1.651 m)   Wt 136 lb 3.2 oz (61.8 kg)   LMP 03/17/2017 (Approximate)   BMI 22.66 kg/m  CONSTITUTIONAL: Well-developed, well-nourished female in no acute distress.  HENT:  Normocephalic, atraumatic.  NECK: Normal range of motion, supple.  SKIN: Skin is warm and dry. No rash noted.  Not diaphoretic. No erythema. No pallor. [place]: Alert and oriented to person, place, and time.  PSYCHIATRIC: Normal mood and affect. Normal behavior. Normal judgment and thought content. CARDIOVASCULAR:RRR RESPIRATORY:Clear no signs of distress BREASTS: Not Examined ABDOMEN: Soft, non distended; Non tender.  No Organomegaly. PELVIC:Not indicated MUSCULOSKELETAL: Normal range of motion. No tenderness.  No cyanosis, clubbing, or edema    Assessment:   Chronic Amenorrhea    Plan:   Given her labs and ultrasound normal and positive provera challenge test we talked about use of  provera and clomid to induce ovulation. Reviewed risks and benefits of clomid use including side effects , risks of multiple pregnancies, cyst formation , moods swings, hot flashes, nausea, abdominal discomfort and visual disturbances. Discussed use of provera to induce period then instructed to take 39m of clomid once daily on day 3-7 of her cycle. She was instructed to use an ovulation kit to detect ovulation and to notify me of results. Encouraged intercourse daily or every other day on days 10-16. She is to remain supine for 30 min. After intercourse.  She will try this for 3 cycles and will increase dose if unsuccessful. She verbalizes understanding and agrees to plan of care. Follow up prn or in 3 months.   I attest more than 50% of visit spent reviewing labs/ultrasound results, discussing diagnosis and developing plan. We then discussed use of provera and clomid.   APhilip Aspen CNM

## 2017-03-24 NOTE — Progress Notes (Signed)

## 2017-04-11 ENCOUNTER — Other Ambulatory Visit: Payer: Self-pay

## 2017-04-11 ENCOUNTER — Encounter: Payer: Self-pay | Admitting: Emergency Medicine

## 2017-04-11 ENCOUNTER — Ambulatory Visit
Admission: EM | Admit: 2017-04-11 | Discharge: 2017-04-11 | Disposition: A | Discharge status: Home / Self Care | Payer: No Typology Code available for payment source | Attending: Emergency Medicine | Admitting: Emergency Medicine

## 2017-04-11 DIAGNOSIS — R05 Cough: Secondary | ICD-10-CM | POA: Diagnosis not present

## 2017-04-11 DIAGNOSIS — J111 Influenza due to unidentified influenza virus with other respiratory manifestations: Secondary | ICD-10-CM

## 2017-04-11 DIAGNOSIS — R5383 Other fatigue: Secondary | ICD-10-CM | POA: Diagnosis not present

## 2017-04-11 LAB — URINALYSIS, COMPLETE (UACMP) WITH MICROSCOPIC
Glucose, UA: NEGATIVE mg/dL
Hgb urine dipstick: NEGATIVE
Leukocytes, UA: NEGATIVE
Nitrite: NEGATIVE
PH: 5.5 (ref 5.0–8.0)
PROTEIN: 30 mg/dL — AB
RBC / HPF: NONE SEEN RBC/hpf (ref 0–5)
Specific Gravity, Urine: >1.03 — ABNORMAL HIGH (ref 1.005–1.030)

## 2017-04-11 MED ORDER — IBUPROFEN 600 MG PO TABS: 600.0000 mg | ORAL_TABLET | Freq: Four times a day (QID) | ORAL | 0 refills | Status: DC | PRN

## 2017-04-11 MED ORDER — FLUTICASONE PROPIONATE 50 MCG/ACT NA SUSP: 2.0000 | Freq: Every day | NASAL | 0 refills | Status: DC

## 2017-04-11 MED ORDER — OSELTAMIVIR PHOSPHATE 75 MG PO CAPS: 75.0000 mg | ORAL_CAPSULE | Freq: Two times a day (BID) | ORAL | 0 refills | Status: DC

## 2017-04-11 NOTE — ED Triage Notes (Signed)
Patient c/o cough, fever and bodyaches that started yesterday.

## 2017-04-11 NOTE — Discharge Instructions (Signed)
Push plenty of fluids.  600 mg of ibuprofen with 1 g of Tylenol 3-4 times a day as needed for fever, body aches.  Your urine was negative for UTI.  Finish the Tamiflu unless your doctor tells you to stop.  Go to the ER for the signs and symptoms we discussed, if you get worse, or for any other concerns.

## 2017-04-11 NOTE — ED Provider Notes (Signed)
HPI  SUBJECTIVE:  Jamie Dudley is a 37 y.o. female who presents with acute onset of fevers T-max 103, headaches, body aches, fatigue, mild cough.  She reports ear stuffiness, but no pain.  She also has some mild nasal congestion.  She denies sore throat, sinus pain or pressure, postnasal drip, wheezing, chest pain, shortness of breath.  No neck stiffness, rash.  Some mild photophobia.  No abdominal pain.  Some urinary urgency yesterday, but no dysuria, frequency, cloudy odorous urine, blood in her urine.  No skin infection.  She did not get a flu shot this year.  No antipyretic in the past 6-8 hours.  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  She has tried TheraFlu, Tylenol severe cold and flu with some improvement in her symptoms.  Symptoms are worse with movement.  Past medical history negative for diabetes, hypertension, pulmonary disease, smoking, immunocompromise.  LMP: Last week.  Denies possibility of being pregnant.  ZOX:WRUEA, Vanita Panda, MD   Past Medical History:  Diagnosis Date  . Atypical squamous cells of undetermined significance (ASC-US) on cervical Pap smear   . Depression   . Sinusitis   . Skin-picking disorder     Past Surgical History:  Procedure Laterality Date  . COLPOSCOPY    . WISDOM TOOTH EXTRACTION      Family History  Problem Relation Age of Onset  . Heart disease Paternal Grandfather   . Hypertension Mother     Social History   Tobacco Use  . Smoking status: Never Smoker  . Smokeless tobacco: Never Used  Substance Use Topics  . Alcohol use: Yes    Comment: rare  . Drug use: No    No current facility-administered medications for this encounter.   Current Outpatient Medications:  .  medroxyPROGESTERone (PROVERA) 10 MG tablet, Take 1 tablet (10 mg total) by mouth daily for 5 days., Disp: 5 tablet, Rfl: 11 .  fluticasone (FLONASE) 50 MCG/ACT nasal spray, Place 2 sprays into both nostrils daily., Disp:  16 g, Rfl: 0 .  ibuprofen (ADVIL,MOTRIN) 600 MG tablet, Take 1 tablet (600 mg total) by mouth every 6 (six) hours as needed., Disp: 30 tablet, Rfl: 0 .  oseltamivir (TAMIFLU) 75 MG capsule, Take 1 capsule (75 mg total) by mouth 2 (two) times daily. X 5 days, Disp: 10 capsule, Rfl: 0  No Known Allergies   ROS  As noted in HPI.   Physical Exam  BP 114/79 (BP Location: Left Arm)   Pulse 96   Temp 99.2 F (37.3 C) (Oral)   Resp 14   Ht 5\' 5"  (1.651 m)   Wt 130 lb (59 kg)   LMP 04/04/2017 (Approximate)   SpO2 100%   BMI 21.63 kg/m   Constitutional: Well developed, well nourished, no acute distress Eyes: PERRL, EOMI, conjunctiva normal bilaterally HENT: Normocephalic, atraumatic,mucus membranes moist. TMs normal bilaterally.  Positive clear rhinorrhea and erythematous, swollen turbinates particularly on the right side.  No sinus tenderness.  Oropharynx normal.  Uvula midline.  Positive cobblestoning.  No appreciable postnasal drip. Neck: No cervical lymphadenopathy, meningismus. Respiratory: Clear to auscultation bilaterally, no rales, no wheezing, no rhonchi Cardiovascular: Normal rate and rhythm, no murmurs, no gallops, no rubs GI: Positive suprapubic tenderness.  No flank tenderness.  No other abdominal tenderness.  Soft, nondistended, normal bowel sounds, no rebound, no guarding Back: no CVAT skin: No rash, skin intact Musculoskeletal: No edema, no tenderness, no deformities Neurologic: Alert &  oriented x 3, CN II-XII grossly intact, no motor deficits, sensation grossly intact Psychiatric: Speech and behavior appropriate   ED Course   Medications - No data to display  Orders Placed This Encounter  Procedures  . Urinalysis, Complete w Microscopic    Standing Status:   Standing    Number of Occurrences:   1   Results for orders placed or performed during the hospital encounter of 04/11/17 (from the past 24 hour(s))  Urinalysis, Complete w Microscopic     Status:  Abnormal   Collection Time: 04/11/17 12:57 PM  Result Value Ref Range   Color, Urine AMBER (A) YELLOW   APPearance CLEAR CLEAR   Specific Gravity, Urine >1.030 (H) 1.005 - 1.030   pH 5.5 5.0 - 8.0   Glucose, UA NEGATIVE NEGATIVE mg/dL   Hgb urine dipstick NEGATIVE NEGATIVE   Bilirubin Urine SMALL (A) NEGATIVE   Ketones, ur TRACE (A) NEGATIVE mg/dL   Protein, ur 30 (A) NEGATIVE mg/dL   Nitrite NEGATIVE NEGATIVE   Leukocytes, UA NEGATIVE NEGATIVE   Squamous Epithelial / LPF 0-5 (A) NONE SEEN   WBC, UA 0-5 0 - 5 WBC/hpf   RBC / HPF NONE SEEN 0 - 5 RBC/hpf   Bacteria, UA FEW (A) NONE SEEN   Mucus PRESENT    No results found.  ED Clinical Impression  Influenza   ED Assessment/Plan   Checking a urine to rule out UTI given suprapubic tenderness, otherwise, presentation is most consistent with influenza.  Plan to send home with Flonase,Tamiflu, ibuprofen 600 mg to take with 1 g of Tylenol 3-4 times a day.  Stop all other cold medications.  We will also write a work note for today, tomorrow and the next day.  Follow-up with her primary care physician as needed, to the ER if she gets worse.  Urine concentrated with trace ketones and proteinuria and a few bacteria.  Negative nitrite, esterase.  We will not treat for UTI.  Will treat for influenza.  Discussed labs,  MDM, plan and followup with patient Discussed sn/sx that should prompt return to the ED. patient agrees with plan.   Meds ordered this encounter  Medications  . ibuprofen (ADVIL,MOTRIN) 600 MG tablet    Sig: Take 1 tablet (600 mg total) by mouth every 6 (six) hours as needed.    Dispense:  30 tablet    Refill:  0  . oseltamivir (TAMIFLU) 75 MG capsule    Sig: Take 1 capsule (75 mg total) by mouth 2 (two) times daily. X 5 days    Dispense:  10 capsule    Refill:  0  . fluticasone (FLONASE) 50 MCG/ACT nasal spray    Sig: Place 2 sprays into both nostrils daily.    Dispense:  16 g    Refill:  0    *This clinic note  was created using Scientist, clinical (histocompatibility and immunogenetics)Dragon dictation software. Therefore, there may be occasional mistakes despite careful proofreading.  ?   Domenick GongMortenson, Abdi Husak, MD 04/11/17 [id number]

## 2017-05-23 ENCOUNTER — Ambulatory Visit (INDEPENDENT_AMBULATORY_CARE_PROVIDER_SITE_OTHER): Payer: No Typology Code available for payment source | Admitting: Family Medicine

## 2017-05-23 ENCOUNTER — Encounter: Payer: Self-pay | Admitting: Family Medicine

## 2017-05-23 VITALS — BP 110/88 | HR 80 | Ht 65.0 in | Wt 131.0 lb

## 2017-05-23 DIAGNOSIS — R8290 Unspecified abnormal findings in urine: Secondary | ICD-10-CM | POA: Diagnosis not present

## 2017-05-23 LAB — POCT URINALYSIS DIPSTICK
Bilirubin, UA: NEGATIVE
Blood, UA: NEGATIVE
GLUCOSE UA: NEGATIVE
Ketones, UA: NEGATIVE
LEUKOCYTES UA: NEGATIVE
NITRITE UA: NEGATIVE
Protein, UA: NEGATIVE
Spec Grav, UA: 1.02 (ref 1.010–1.025)
Urobilinogen, UA: 0.2 E.U./dL
pH, UA: 6 (ref 5.0–8.0)

## 2017-05-23 LAB — POCT CBG (FASTING - GLUCOSE)-MANUAL ENTRY: GLUCOSE FASTING, POC: 74 mg/dL (ref 70–99)

## 2017-05-23 NOTE — Progress Notes (Signed)
Name: Jamie Dudley   MRN: [id number]    DOB: 10/11/1980   Date:05/23/2017       Progress Note  Subjective  Chief Complaint  Chief Complaint  Patient presents with  . Urinary Tract Infection    sweet smell to urine    Urinary Tract Infection   This is a new problem. The current episode started in the past 7 days (Friday). The problem occurs intermittently. The problem has been waxing and waning. The pain is at a severity of 4/10. The patient is experiencing no pain. There has been no fever. There is no history of pyelonephritis. Associated symptoms include frequency. Pertinent negatives include no chills, discharge, flank pain, hematuria, hesitancy, nausea, sweats, urgency or vomiting. The treatment provided mild relief. There is no history of recurrent UTIs.    No problem-specific Assessment & Plan notes found for this encounter.   Past Medical History:  Diagnosis Date  . Atypical squamous cells of undetermined significance (ASC-US) on cervical Pap smear   . Depression   . Sinusitis   . Skin-picking disorder     Past Surgical History:  Procedure Laterality Date  . COLPOSCOPY    . WISDOM TOOTH EXTRACTION      Family History  Problem Relation Age of Onset  . Heart disease Paternal Grandfather   . Hypertension Mother     Social History   Socioeconomic History  . Marital status: Single    Spouse name: Not on file  . Number of children: Not on file  . Years of education: Not on file  . Highest education level: Not on file  Social Needs  . Financial resource strain: Not on file  . Food insecurity - worry: Not on file  . Food insecurity - inability: Not on file  . Transportation needs - medical: Not on file  . Transportation needs - non-medical: Not on file  Occupational History  . Not on file  Tobacco Use  . Smoking status: Never Smoker  . Smokeless tobacco: Never Used  Substance and Sexual Activity  . Alcohol use: Yes    Comment: rare  . Drug use: No  . Sexual  activity: Yes    Birth control/protection: None  Other Topics Concern  . Not on file  Social History Narrative  . Not on file    No Known Allergies  Outpatient Medications Prior to Visit  Medication Sig Dispense Refill  . fluticasone (FLONASE) 50 MCG/ACT nasal spray Place 2 sprays into both nostrils daily. 16 g 0  . ibuprofen (ADVIL,MOTRIN) 600 MG tablet Take 1 tablet (600 mg total) by mouth every 6 (six) hours as needed. 30 tablet 0  . medroxyPROGESTERone (PROVERA) 10 MG tablet Take 1 tablet (10 mg total) by mouth daily for 5 days. 5 tablet 11  . oseltamivir (TAMIFLU) 75 MG capsule Take 1 capsule (75 mg total) by mouth 2 (two) times daily. X 5 days 10 capsule 0   No facility-administered medications prior to visit.     Review of Systems  Constitutional: Negative for chills, fever, malaise/fatigue and weight loss.  HENT: Negative for ear discharge, ear pain and sore throat.   Eyes: Negative for blurred vision.  Respiratory: Negative for cough, sputum production, shortness of breath and wheezing.   Cardiovascular: Negative for chest pain, palpitations and leg swelling.  Gastrointestinal: Negative for abdominal pain, blood in stool, constipation, diarrhea, heartburn, melena, nausea and vomiting.  Genitourinary: Positive for frequency. Negative for dysuria, flank pain, hematuria, hesitancy and urgency.  Musculoskeletal:  Negative for back pain, joint pain, myalgias and neck pain.  Skin: Negative for rash.  Neurological: Negative for dizziness, tingling, sensory change, focal weakness and headaches.  Endo/Heme/Allergies: Negative for environmental allergies and polydipsia. Does not bruise/bleed easily.  Psychiatric/Behavioral: Negative for depression and suicidal ideas. The patient is not nervous/anxious and does not have insomnia.      Objective  Vitals:   05/23/17 1609  BP: 110/88  Pulse: 80  Weight: 131 lb (59.4 kg)  Height: 5\' 5"  (1.651 m)    Physical Exam   Constitutional: She is well-developed, well-nourished, and in no distress. No distress.  HENT:  Head: Normocephalic and atraumatic.  Right Ear: External ear normal.  Left Ear: External ear normal.  Nose: Nose normal.  Mouth/Throat: Oropharynx is clear and moist.  Eyes: Conjunctivae and EOM are normal. Pupils are equal, round, and reactive to light. Right eye exhibits no discharge. Left eye exhibits no discharge.  Neck: Normal range of motion. Neck supple. No JVD present. No thyromegaly present.  Cardiovascular: Normal rate, regular rhythm, normal heart sounds and intact distal pulses. Exam reveals no gallop and no friction rub.  No murmur heard. Pulmonary/Chest: Effort normal and breath sounds normal. She has no wheezes. She has no rales.  Abdominal: Soft. Bowel sounds are normal. She exhibits no mass. There is no tenderness. There is no guarding.  Musculoskeletal: Normal range of motion. She exhibits no edema.  Lymphadenopathy:    She has no cervical adenopathy.  Neurological: She is alert. She has normal reflexes.  Skin: Skin is warm and dry. She is not diaphoretic.  Psychiatric: Mood and affect normal.  Nursing note and vitals reviewed.     Assessment & Plan  Problem List Items Addressed This Visit    None    Visit Diagnoses    Urine abnormality    -  Primary   fruity odor   Relevant Orders   POCT Urinalysis Dipstick (Completed)   POCT CBG (Fasting - Glucose) (Completed)      No orders of the defined types were placed in this encounter.     Dr. Hayden [facility]  05/23/17

## 2017-05-23 NOTE — Patient Instructions (Signed)
Diabetic Ketoacidosis °Diabetic ketoacidosis is a life-threatening complication of diabetes. If it is not treated, it can cause severe dehydration and organ damage and can lead to a coma or death. °What are the causes? °This condition develops when there is not enough of the hormone insulin in the body. Insulin helps the body to break down sugar for energy. Without insulin, the body cannot break down sugar, so it breaks down fats instead. This leads to the production of acids that are called ketones. Ketones are poisonous at high levels. °This condition can be triggered by: °· Stress on the body that is brought on by an illness. °· Medicines that raise blood glucose levels. °· Not taking diabetes medicine. ° °What are the signs or symptoms? °Symptoms of this condition include: °· Fatigue. °· Weight loss. °· Excessive thirst. °· Light-headedness. °· Fruity or sweet-smelling breath. °· Excessive urination. °· Vision changes. °· Confusion or irritability. °· Nausea. °· Vomiting. °· Rapid breathing. °· Abdominal pain. °· Feeling flushed. ° °How is this diagnosed? °This condition is diagnosed based on a medical history, a physical exam, and blood tests. You may also have a urine test that checks for ketones. °How is this treated? °This condition may be treated with: °· Fluid replacement. This may be done to correct dehydration. °· Insulin injections. These may be given through the skin or through an IV tube. °· Electrolyte replacement. Electrolytes, such as potassium and sodium, may be given in pill form or through an IV tube. °· Antibiotic medicines. These may be prescribed if your condition was caused by an infection. ° °Follow these instructions at home: °Eating and drinking °· Drink enough fluids to keep your urine clear or pale yellow. °· If you cannot eat, alternate between drinking fluids with sugar (such as juice) and salty fluids (such as broth or bouillon). °· If you can eat, follow your usual diet and drink  sugar-free liquids, such as water. °Other Instructions ° °· Take insulin as directed by your health care provider. Do not skip insulin injections. Do not use expired insulin. °· If your blood sugar is over 240 mg/dL, monitor your urine ketones every 4-6 hours. °· If you were prescribed an antibiotic medicine, finish all of it even if you start to feel better. °· Rest and exercise only as directed by your health care provider. °· If you get sick, call your health care provider and begin treatment quickly. Your body often needs extra insulin to fight an illness. °· Check your blood glucose levels regularly. If your blood glucose is high, drink plenty of fluids. This helps to flush out ketones. °Contact a health care provider if: °· Your blood glucose level is too high or too low. °· You have ketones in your urine. °· You have a fever. °· You cannot eat. °· You cannot tolerate fluids. °· You have been vomiting for more than 2 hours. °· You continue to have symptoms of this condition. °· You develop new symptoms. °Get help right away if: °· Your blood glucose levels continue to be high (elevated). °· Your monitor reads “high” even when you are taking insulin. °· You faint. °· You have chest pain. °· You have trouble breathing. °· You have a sudden, severe headache. °· You have sudden weakness in one arm or one leg. °· You have sudden trouble speaking or swallowing. °· You have vomiting or diarrhea that gets worse after 3 hours. °· You feel severely fatigued. °· You have trouble thinking. °· You   have abdominal pain. °· You are severely dehydrated. Symptoms of severe dehydration include: °? Extreme thirst. °? Dry mouth. °? Blue lips. °? Cold hands and feet. °? Rapid breathing. °This information is not intended to replace advice given to you by your health care provider. Make sure you discuss any questions you have with your health care provider. °Document Released: 02/26/2000 Document Revised: 08/06/2015 Document  Reviewed: 02/05/2014 °Elsevier Interactive Patient Education © 2017 [employer] ° °

## 2017-05-24 ENCOUNTER — Telehealth: Payer: No Typology Code available for payment source | Admitting: Nurse Practitioner

## 2017-05-24 DIAGNOSIS — R112 Nausea with vomiting, unspecified: Secondary | ICD-10-CM

## 2017-05-24 MED ORDER — ONDANSETRON HCL 4 MG PO TABS: 4.0000 mg | ORAL_TABLET | Freq: Three times a day (TID) | ORAL | 0 refills | Status: DC | PRN

## 2017-05-24 NOTE — Progress Notes (Signed)

## 2017-09-12 ENCOUNTER — Encounter: Payer: Self-pay | Admitting: Family Medicine

## 2017-09-18 ENCOUNTER — Encounter: Payer: Self-pay | Admitting: Family Medicine

## 2017-09-18 ENCOUNTER — Ambulatory Visit (INDEPENDENT_AMBULATORY_CARE_PROVIDER_SITE_OTHER): Payer: No Typology Code available for payment source | Admitting: Family Medicine

## 2017-09-18 VITALS — BP 100/80 | HR 80 | Ht 65.0 in | Wt 135.0 lb

## 2017-09-18 DIAGNOSIS — F331 Major depressive disorder, recurrent, moderate: Secondary | ICD-10-CM | POA: Diagnosis not present

## 2017-09-18 DIAGNOSIS — Z0000 Encounter for general adult medical examination without abnormal findings: Secondary | ICD-10-CM

## 2017-09-18 NOTE — Assessment & Plan Note (Signed)
PHQ reviewed/zero. Patient to continue outpatient psychology therapy

## 2017-09-18 NOTE — Progress Notes (Signed)
Name: Jamie Dudley   MRN: [id number]    DOB: 10-07-1980   Date:09/18/2017       Progress Note  Subjective  Chief Complaint  Chief Complaint  Patient presents with  . Annual Exam    no issues/ no pap until 2021    Patient is a 37 year old female who presents for a comprehensive physical exam. The patient reports the following problems: none. Health maintenance has been reviewed t dap noted and administered.   MDD (major depressive disorder), recurrent episode, moderate (HCC) PHQ reviewed/zero. Patient to continue outpatient psychology therapy   Past Medical History:  Diagnosis Date  . Atypical squamous cells of undetermined significance (ASC-US) on cervical Pap smear   . Depression   . Sinusitis   . Skin-picking disorder     Past Surgical History:  Procedure Laterality Date  . COLPOSCOPY    . WISDOM TOOTH EXTRACTION      Family History  Problem Relation Age of Onset  . Heart disease Paternal Grandfather   . Hypertension Mother     Social History   Socioeconomic History  . Marital status: Single    Spouse name: Not on file  . Number of children: Not on file  . Years of education: Not on file  . Highest education level: Not on file  Occupational History  . Not on file  Social Needs  . Financial resource strain: Not on file  . Food insecurity:    Worry: Not on file    Inability: Not on file  . Transportation needs:    Medical: Not on file    Non-medical: Not on file  Tobacco Use  . Smoking status: Never Smoker  . Smokeless tobacco: Never Used  Substance and Sexual Activity  . Alcohol use: Yes    Comment: rare  . Drug use: No  . Sexual activity: Yes    Birth control/protection: None  Lifestyle  . Physical activity:    Days per week: Not on file    Minutes per session: Not on file  . Stress: Not on file  Relationships  . Social connections:    Talks on phone: Not on file    Gets together: Not on file    Attends religious service: Not on file    Active  member of club or organization: Not on file    Attends meetings of clubs or organizations: Not on file    Relationship status: Not on file  . Intimate partner violence:    Fear of current or ex partner: Not on file    Emotionally abused: Not on file    Physically abused: Not on file    Forced sexual activity: Not on file  Other Topics Concern  . Not on file  Social History Narrative  . Not on file    No Known Allergies  Outpatient Medications Prior to Visit  Medication Sig Dispense Refill  . fluticasone (FLONASE) 50 MCG/ACT nasal spray Place 2 sprays into both nostrils daily. 16 g 0  . ibuprofen (ADVIL,MOTRIN) 600 MG tablet Take 1 tablet (600 mg total) by mouth every 6 (six) hours as needed. 30 tablet 0  . medroxyPROGESTERone (PROVERA) 10 MG tablet Take 1 tablet (10 mg total) by mouth daily for 5 days. 5 tablet 11  . ondansetron (ZOFRAN) 4 MG tablet Take 1 tablet (4 mg total) by mouth every 8 (eight) hours as needed for nausea or vomiting. 20 tablet 0  . oseltamivir (TAMIFLU) 75 MG capsule Take 1  capsule (75 mg total) by mouth 2 (two) times daily. X 5 days 10 capsule 0   No facility-administered medications prior to visit.     Review of Systems  Constitutional: Negative for chills, fever, malaise/fatigue and weight loss.  HENT: Negative for ear discharge, ear pain and sore throat.   Eyes: Negative for blurred vision.  Respiratory: Negative for cough, sputum production, shortness of breath and wheezing.   Cardiovascular: Negative for chest pain, palpitations and leg swelling.  Gastrointestinal: Negative for abdominal pain, blood in stool, constipation, diarrhea, heartburn, melena and nausea.  Genitourinary: Negative for dysuria, frequency, hematuria and urgency.  Musculoskeletal: Negative for back pain, joint pain, myalgias and neck pain.  Skin: Negative for rash.  Neurological: Negative for dizziness, tingling, sensory change, focal weakness and headaches.  Endo/Heme/Allergies:  Negative for environmental allergies and polydipsia. Does not bruise/bleed easily.  Psychiatric/Behavioral: Negative for depression and suicidal ideas. The patient is not nervous/anxious and does not have insomnia.      Objective  Vitals:   09/18/17 0837  BP: 100/80  Pulse: 80  Weight: 135 lb (61.2 kg)  Height: 5\' 5"  (1.651 m)    Physical Exam  Constitutional: She is oriented to person, place, and time. Vital signs are normal. She appears well-developed and well-nourished.  HENT:  Head: Normocephalic.  Right Ear: Hearing, tympanic membrane, external ear and ear canal normal.  Left Ear: Hearing, tympanic membrane, external ear and ear canal normal.  Nose: Nose normal. No mucosal edema.  Mouth/Throat: Uvula is midline and oropharynx is clear and moist. No oropharyngeal exudate, posterior oropharyngeal edema or posterior oropharyngeal erythema.  Eyes: Pupils are equal, round, and reactive to light. Conjunctivae, EOM and lids are normal. Lids are everted and swept, no foreign bodies found. Left eye exhibits no hordeolum. No foreign body present in the left eye. Right conjunctiva is not injected. Left conjunctiva is not injected. No scleral icterus.  Fundoscopic exam:      The right eye shows no AV nicking.       The left eye shows no AV nicking.  Neck: Trachea normal, normal range of motion and full passive range of motion without pain. Neck supple. Normal carotid pulses and no JVD present. Carotid bruit is not present. No tracheal deviation present. No thyroid mass and no thyromegaly present.  Cardiovascular: Normal rate, regular rhythm, S1 normal, S2 normal, normal heart sounds, intact distal pulses and normal pulses. PMI is not displaced. Exam reveals no gallop, no S3, no S4, no friction rub and no decreased pulses.  No murmur heard.  No systolic murmur is present.  No diastolic murmur is present. Pulmonary/Chest: Effort normal and breath sounds normal. No stridor. No respiratory  distress. She has no decreased breath sounds. She has no wheezes. She has no rhonchi. She has no rales.  Abdominal: Soft. Normal aorta and bowel sounds are normal. She exhibits no mass. There is no hepatosplenomegaly. There is no tenderness. There is no rebound and no guarding. Hernia confirmed negative in the ventral area.  Musculoskeletal: Normal range of motion. She exhibits no edema or tenderness.       Cervical back: Normal.       Thoracic back: Normal.       Lumbar back: Normal.  Lymphadenopathy:    She has no cervical adenopathy.    She has no axillary adenopathy.  Neurological: She is alert and oriented to person, place, and time. She has normal strength. She displays normal reflexes. No cranial nerve deficit or  sensory deficit.  Reflex Scores:      Tricep reflexes are 2+ on the right side and 2+ on the left side.      Bicep reflexes are 2+ on the right side and 2+ on the left side.      Brachioradialis reflexes are 2+ on the right side and 2+ on the left side.      Patellar reflexes are 2+ on the right side and 2+ on the left side.      Achilles reflexes are 2+ on the right side and 2+ on the left side. Skin: Skin is warm. No rash noted.  seb cysts buttock  Psychiatric: She has a normal mood and affect. Her mood appears not anxious. She does not exhibit a depressed mood.  Nursing note and vitals reviewed. Jamie Dudley is a 37 y.o. female who presents today for her Complete Annual Exam. She feels well. She reports exercising . She reports she is sleeping well. Immunizations are reviewed and recommendations provided.   Age appropriate screening tests are discussed. Counseling given for risk factor reduction interventions. Marland Kitchendj Assessment & Plan  Problem List Items Addressed This Visit      Other   MDD (major depressive disorder), recurrent episode, moderate (HCC)    PHQ reviewed/zero. Patient to continue outpatient psychology therapy       Other Visit Diagnoses    Annual physical  exam    -  Primary   No subjective/objective concerns.   Relevant Orders   Lipid panel      No orders of the defined types were placed in this encounter.     Dr. Hayden [facility]  09/18/17

## 2017-09-19 LAB — LIPID PANEL
CHOLESTEROL TOTAL: 225 mg/dL — AB (ref 100–199)
Chol/HDL Ratio: 3.7 ratio (ref 0.0–4.4)
HDL: 61 mg/dL (ref >39)
LDL Calculated: 147 mg/dL — ABNORMAL HIGH (ref 0–99)
Triglycerides: 85 mg/dL (ref 0–149)
VLDL Cholesterol Cal: 17 mg/dL (ref 5–40)

## 2017-11-06 ENCOUNTER — Ambulatory Visit
Admission: EM | Admit: 2017-11-06 | Discharge: 2017-11-06 | Disposition: A | Discharge status: Home / Self Care | Payer: No Typology Code available for payment source | Attending: Family Medicine | Admitting: Family Medicine

## 2017-11-06 ENCOUNTER — Other Ambulatory Visit: Payer: Self-pay

## 2017-11-06 ENCOUNTER — Encounter: Payer: Self-pay | Admitting: Emergency Medicine

## 2017-11-06 DIAGNOSIS — J069 Acute upper respiratory infection, unspecified: Secondary | ICD-10-CM

## 2017-11-06 DIAGNOSIS — J029 Acute pharyngitis, unspecified: Secondary | ICD-10-CM | POA: Diagnosis not present

## 2017-11-06 LAB — RAPID STREP SCREEN (MED CTR MEBANE ONLY): Streptococcus, Group A Screen (Direct): NEGATIVE

## 2017-11-06 MED ORDER — LIDOCAINE VISCOUS HCL 2 % MT SOLN: 10.0000 mL | Freq: Three times a day (TID) | OROMUCOSAL | 0 refills | Status: DC | PRN

## 2017-11-06 NOTE — Discharge Instructions (Addendum)
Take medication as prescribed. Rest. Drink plenty of fluids. Over the counter medication as discussed.  ° °Follow up with your primary care physician this week as needed. Return to Urgent care for new or worsening concerns.  ° °

## 2017-11-06 NOTE — ED Triage Notes (Signed)
C/o sore throat, fatigue, hoarseness, fever (she did not check it), and headache started 4 days ago but started feeling worse yesterday.

## 2017-11-06 NOTE — ED Provider Notes (Signed)
[facility] ____________________________________________  Time seen: Approximately 4:14 PM  I have reviewed the triage vital signs and the nursing notes.   HISTORY  Chief Complaint Sore Throat   HPI Jamie Dudley is a 37 y.o. female presenting for evaluation of runny nose, nasal congestion, cough and sore throat.  States sore throat started Thursday into Friday, and had continued but is since improved now more with a loss of voice and hoarse voice.  States Saturday she started with nasal congestion, cough and overall rundown feeling.  Reports felt warm yesterday, subjective fever.  Did take some Tylenol with slight improvement, no resolution.  Has not been taking any other over-the-counter medications for the same complaints.  States feels like she has a tickle and irritation in her throat is making her cough.  Reports someone at work recently with similar.  Works around children.  Has overall continue to remain active.  Denies other aggravating or alleviating factors.  Reports otherwise feels well.  Denies chest pain, shortness of breath, abdominal pain, rash. Denies recent sickness. Denies recent antibiotic use.   Duanne Limerick, MD: PCP   Past Medical History:  Diagnosis Date  . Atypical squamous cells of undetermined significance (ASC-US) on cervical Pap smear   . Depression   . Sinusitis   . Skin-picking disorder     Patient Active Problem List   Diagnosis Date Noted  . MDD (major depressive disorder), recurrent episode, moderate (HCC) 09/18/2017  . Nausea vomiting and diarrhea 09/04/2016  . Acute cystitis without hematuria 09/04/2016    Past Surgical History:  Procedure Laterality Date  . COLPOSCOPY    . WISDOM TOOTH EXTRACTION       No current facility-administered medications for this encounter.   Current Outpatient Medications:  .  lidocaine (XYLOCAINE) 2 % solution, Use as directed 10 mLs in the mouth or throat every 8 (eight) hours as needed (sore  throat. gargle and spit as needed for sore throat.)., Disp: 100 mL, Rfl: 0  Allergies Patient has no known allergies.  Family History  Problem Relation Age of Onset  . Heart disease Paternal Grandfather   . Hypertension Mother     Social History Social History   Tobacco Use  . Smoking status: Never Smoker  . Smokeless tobacco: Never Used  Substance Use Topics  . Alcohol use: Yes    Comment: rare  . Drug use: No    Review of Systems Constitutional: subjective fever.  ENT: as above.  Cardiovascular: Denies chest pain. Respiratory: Denies shortness of breath. Gastrointestinal: No abdominal pain.   Musculoskeletal: Negative for back pain. Skin: Negative for rash.  ____________________________________________   PHYSICAL EXAM:  VITAL SIGNS: ED Triage Vitals  Enc Vitals Group     BP 11/06/17 1541 121/89     Pulse Rate 11/06/17 1541 76     Resp 11/06/17 1541 16     Temp 11/06/17 1541 98 F (36.7 C)     Temp Source 11/06/17 1541 Oral     SpO2 11/06/17 1541 100 %     Weight 11/06/17 1539 135 lb (61.2 kg)     Height 11/06/17 1539 5\' 5"  (1.651 m)     Head Circumference --      Peak Flow --      Pain Score 11/06/17 1539 3     Pain Loc --      Pain Edu? --      Excl. in GC? --     Constitutional: Alert and oriented. Well  appearing and in no acute distress. Eyes: Conjunctivae are normal.  Head: Atraumatic.No swelling. No erythema.  No frontal sinus tenderness palpation.  Mild bilateral maxillary sinus tenderness to palpation.  Ears: no erythema, normal TMs bilaterally.   Nose:Nasal congestion with clear rhinorrhea  Mouth/Throat: Mucous membranes are moist. No pharyngeal erythema. No tonsillar swelling or exudate.  Neck: No stridor.  No cervical spine tenderness to palpation. Hematological/Lymphatic/Immunilogical: No cervical lymphadenopathy. Cardiovascular: Normal rate, regular rhythm. Grossly normal heart sounds.  Good peripheral circulation. Respiratory: Normal  respiratory effort.  No retractions. No wheezes, rales or rhonchi. Good air movement.  Musculoskeletal: Ambulatory with steady gait.  Neurologic:  Normal speech and language. No gait instability. Skin:  Skin appears warm, dry and intact. No rash noted. Psychiatric: Mood and affect are normal. Speech and behavior are normal.  ___________________________________________   LABS (all labs ordered are listed, but only abnormal results are displayed)  Labs Reviewed  RAPID STREP SCREEN ([facility] ONLY)  CULTURE, GROUP A STREP [place]([facility])    PROCEDURES Procedures   INITIAL IMPRESSION / ASSESSMENT AND PLAN / ED COURSE  Pertinent labs & imaging results that were available during my care of the patient were reviewed by me and considered in my medical decision making (see chart for details).  Well-appearing patient.  No acute distress.  Quick strep negative, will culture.  Suspect viral upper respiratory infection, viral pharyngitis and viral laryngitis.  Encourage rest, fluids, supportive care, throat gargles, PRN viscous lidocaine Rx given, over-the-counter cough and congestion medication.  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 Discussed indication, risks and benefits of medications with patient.  Discussed follow up with Primary care physician this week. Discussed follow up and return parameters including no resolution or any worsening concerns. Patient verbalized understanding and agreed to plan.   ____________________________________________   FINAL CLINICAL IMPRESSION(S) / ED DIAGNOSES  Final diagnoses:  Pharyngitis, unspecified etiology  Upper respiratory tract infection, unspecified type     ED Discharge Orders         Ordered    lidocaine (XYLOCAINE) 2 % solution  Every 8 hours PRN     11/06/17 1602           Note: This dictation was prepared with Dragon  dictation along with smaller phrase technology. Any transcriptional errors that result from this process are unintentional.         Renford DillsMiller, Jess Toney, NP 11/06/17 1630

## 2017-11-09 LAB — CULTURE, GROUP A STREP (THRC)

## 2018-03-14 NOTE — L&D Delivery Note (Signed)
Delivery Note At 0758 a viable and healthy female "Jamie Dudley"  was delivered via  (Presentation:OA ;  ).  APGAR: 8,9 ; weight pending skin-to-skin  .   Placenta status: delivered intact with 3 vessel  Cord battledore:  with the following complications: none  Anesthesia:  local Episiotomy:  none Lacerations:  1st and left periclitoral Suture Repair: 3.0 vicryl rapide Est. Blood Loss (mL):  400  Mom to postpartum.  Baby to Couplet care / Skin to Skin.  [name] N [name] 11/21/2018, 8:33 AM

## 2018-04-06 ENCOUNTER — Ambulatory Visit (INDEPENDENT_AMBULATORY_CARE_PROVIDER_SITE_OTHER): Payer: BLUE CROSS/BLUE SHIELD | Admitting: Certified Nurse Midwife

## 2018-04-06 VITALS — BP 148/83 | HR 92 | Ht 65.0 in | Wt 138.2 lb

## 2018-04-06 DIAGNOSIS — Z3201 Encounter for pregnancy test, result positive: Secondary | ICD-10-CM | POA: Diagnosis not present

## 2018-04-06 DIAGNOSIS — N926 Irregular menstruation, unspecified: Secondary | ICD-10-CM | POA: Diagnosis not present

## 2018-04-06 LAB — POCT URINE PREGNANCY: PREG TEST UR: POSITIVE — AB

## 2018-04-06 NOTE — Patient Instructions (Signed)

## 2018-04-06 NOTE — Progress Notes (Signed)
Subjective:    Jamie Dudley is a 38 y.o. female who presents for evaluation of amenorrhea. She believes she could be pregnant. Pregnancy is desired. Sexual Activity: single partner, contraception: none. Current symptoms also include: fatigue. Last period was normal.   Patient's last menstrual period was 02/15/2018 (exact date). The following portions of the patient's history were reviewed and updated as appropriate: allergies, current medications, past family history, past medical history, past social history, past surgical history and problem list.  Review of Systems Pertinent items are noted in HPI.     Objective:    BP (!) 148/83   Pulse 92   Ht 5\' 5"  (1.651 m)   Wt 138 lb 4 oz (62.7 kg)   LMP 02/15/2018 (Exact Date)   BMI 23.01 kg/m  General: alert, cooperative, appears stated age and no acute distress    Lab Review Urine HCG: positive    Assessment:    Absence of menstruation.     Plan:  Positive: EDC: 11/22/18. Briefly discussed pre-natal care options. MD or midwifery care. Pamphlet "every women deserves a midwife " given. Encouraged well-balanced diet, plenty of rest when needed, pre-natal vitamins daily and walking for exercise. Discussed self-help for nausea, avoiding OTC medications until consulting provider or pharmacist, other than Tylenol as needed, minimal caffeine (1-2 cups daily) and avoiding alcohol. She will schedule dating u/s in 1 wk., her initial nurse OB visit @ 10 wks and her NOB physical exam @ 12 wks.   Doreene Burke, CNM

## 2018-04-11 ENCOUNTER — Ambulatory Visit (INDEPENDENT_AMBULATORY_CARE_PROVIDER_SITE_OTHER): Payer: BLUE CROSS/BLUE SHIELD

## 2018-04-11 DIAGNOSIS — Z3687 Encounter for antenatal screening for uncertain dates: Secondary | ICD-10-CM

## 2018-04-11 DIAGNOSIS — N926 Irregular menstruation, unspecified: Secondary | ICD-10-CM

## 2018-04-18 ENCOUNTER — Encounter: Payer: Self-pay | Admitting: Emergency Medicine

## 2018-04-18 ENCOUNTER — Ambulatory Visit
Admission: EM | Admit: 2018-04-18 | Discharge: 2018-04-18 | Disposition: A | Discharge status: Home / Self Care | Payer: BLUE CROSS/BLUE SHIELD | Attending: Family Medicine | Admitting: Family Medicine

## 2018-04-18 ENCOUNTER — Other Ambulatory Visit: Payer: Self-pay

## 2018-04-18 DIAGNOSIS — R350 Frequency of micturition: Secondary | ICD-10-CM | POA: Diagnosis not present

## 2018-04-18 LAB — URINALYSIS, COMPLETE (UACMP) WITH MICROSCOPIC
BILIRUBIN URINE: NEGATIVE
Bacteria, UA: NONE SEEN
Glucose, UA: NEGATIVE mg/dL
Hgb urine dipstick: NEGATIVE
Ketones, ur: NEGATIVE mg/dL
Leukocytes, UA: NEGATIVE
Nitrite: NEGATIVE
PH: 7 (ref 5.0–8.0)
Protein, ur: NEGATIVE mg/dL
RBC / HPF: NONE SEEN RBC/hpf (ref 0–5)
Specific Gravity, Urine: <1.005 — ABNORMAL LOW (ref 1.005–1.030)

## 2018-04-18 NOTE — ED Triage Notes (Signed)
Patient states she has noticed some urinary frequency, odor and discomfort for approximately 3 days

## 2018-04-18 NOTE — ED Provider Notes (Signed)
[facility]    CSN: [id number] Arrival date & time: 04/18/18  1438     History   Chief Complaint Chief Complaint  Patient presents with  . Urinary Frequency    HPI Amal Kizzie IdeLatta is a 38 y.o. female.   38 yo female with a c/o urinary frequency and odor for the last 3 days. Denies any dysuria, pain, hematuria, vaginal discharge or itching. States she's about [redacted] weeks pregnant.   The history is provided by the patient.  Urinary Frequency     Past Medical History:  Diagnosis Date  . Atypical squamous cells of undetermined significance (ASC-US) on cervical Pap smear   . Depression   . Sinusitis   . Skin-picking disorder     Patient Active Problem List   Diagnosis Date Noted  . MDD (major depressive disorder), recurrent episode, moderate (HCC) 09/18/2017    Past Surgical History:  Procedure Laterality Date  . COLPOSCOPY    . WISDOM TOOTH EXTRACTION      OB History    Gravida  1   Para  0   Term  0   Preterm  0   AB  0   Living  0     SAB  0   TAB  0   Ectopic  0   Multiple  0   Live Births  0            Home Medications    Prior to Admission medications   Medication Sig Start Date End Date Taking? Authorizing Provider  lidocaine (XYLOCAINE) 2 % solution Use as directed 10 mLs in the mouth or throat every 8 (eight) hours as needed (sore throat. gargle and spit as needed for sore throat.). 11/06/17   Renford DillsMiller, Lindsey, NP  Prenatal Vit-Fe Fumarate-FA (PRENATAL MULTIVITAMIN) TABS tablet Take 1 tablet by mouth daily at 12 noon.    [provider]    Family History Family History  Problem Relation Age of Onset  . Heart disease Paternal Grandfather   . Hypertension Mother     Social History Social History   Tobacco Use  . Smoking status: Never Smoker  . Smokeless tobacco: Never Used  Substance Use Topics  . Alcohol use: Yes    Comment: rare  . Drug use: No     Allergies   Patient has no known  allergies.   Review of Systems Review of Systems  Genitourinary: Positive for frequency.     Physical Exam Triage Vital Signs ED Triage Vitals  Enc Vitals Group     BP 04/18/18 1603 (!) 133/96     Pulse --      Resp 04/18/18 1603 18     Temp 04/18/18 1603 98.6 F (37 C)     Temp Source 04/18/18 1603 Oral     SpO2 04/18/18 1603 100 %     Weight 04/18/18 1600 138 lb (62.6 kg)     Height 04/18/18 1600 5\' 5"  (1.651 m)     Head Circumference --      Peak Flow --      Pain Score 04/18/18 1600 0     Pain Loc --      Pain Edu? --      Excl. in GC? --    No data found.  Updated Vital Signs BP (!) 133/96   Temp 98.6 F (37 C) (Oral)   Resp 18   Ht 5\' 5"  (1.651 m)   Wt 62.6 kg   LMP  02/15/2018 (Exact Date)   SpO2 100%   BMI 22.96 kg/m   Visual Acuity Right Eye Distance:   Left Eye Distance:   Bilateral Distance:    Right Eye Near:   Left Eye Near:    Bilateral Near:     Physical Exam Vitals signs and nursing note reviewed.  Constitutional:      General: She is not in acute distress.    Appearance: Normal appearance. She is not toxic-appearing or diaphoretic.  Abdominal:     General: There is no distension.  Neurological:     Mental Status: She is alert.      UC Treatments / Results  Labs (all labs ordered are listed, but only abnormal results are displayed) Labs Reviewed  URINALYSIS, COMPLETE (UACMP) WITH MICROSCOPIC - Abnormal; Notable for the following components:      Result Value   Specific Gravity, Urine <1.005 (*)    All other components within normal limits  URINE CULTURE    EKG None  Radiology No results found.  Procedures Procedures (including critical care time)  Medications Ordered in UC Medications - No data to display  Initial Impression / Assessment and Plan / UC Course  I have reviewed the triage vital signs and the nursing notes.  Pertinent labs & imaging results that were available during my care of the patient were  reviewed by me and considered in my medical decision making (see chart for details).      Final Clinical Impressions(s) / UC Diagnoses   Final diagnoses:  Urinary frequency  (unknown etiology)   Discharge Instructions     Follow up with [facility]    ED Prescriptions    None     1. Lab results (negative UA) and diagnosis reviewed with patient 2. Follow-up with OB/GYN tomorrow   Controlled Substance Prescriptions [place] Controlled Substance Registry consulted? Not Applicable   Payton Mccallum, MD 04/18/18 [phone]

## 2018-04-18 NOTE — Discharge Instructions (Signed)
Follow up with OB

## 2018-04-25 NOTE — Patient Instructions (Signed)
WHAT OB PATIENTS CAN EXPECT   Confirmation of pregnancy and ultrasound ordered if medically indicated-[redacted] weeks gestation  New OB (NOB) intake with nurse and New OB (NOB) labs- [redacted] weeks gestation  New OB (NOB) physical examination with provider- 11/[redacted] weeks gestation  Flu vaccine-[redacted] weeks gestation  Anatomy scan-[redacted] weeks gestation  Glucose tolerance test, blood work to test for anemia, T-dap vaccine-[redacted] weeks gestation  Vaginal swabs/cultures-STD/Group B strep-[redacted] weeks gestation  Appointments every 4 weeks until 28 weeks  Every 2 weeks from 28 weeks until 36 weeks  Weekly visits from 36 weeks until delivery  How a Baby Grows During Pregnancy  Pregnancy begins when a female's sperm enters a female's egg (fertilization). Fertilization usually happens in one of the tubes (fallopian tubes) that connect the ovaries to the womb (uterus). The fertilized egg moves down the fallopian tube to the uterus. Once it reaches the uterus, it implants into the lining of the uterus and begins to grow. For the first 10 weeks, the fertilized egg is called an embryo. After 10 weeks, it is called a fetus. As the fetus continues to grow, it receives oxygen and nutrients through tissue (placenta) that grows to support the developing baby. The placenta is the life support system for the baby. It provides oxygen and nutrition and removes waste. Learning as much as you can about your pregnancy and how your baby is developing can help you enjoy the experience. It can also make you aware of when there might be a problem and when to ask questions. How long does a typical pregnancy last? A pregnancy usually lasts 280 days, or about 40 weeks. Pregnancy is divided into three periods of growth, also called trimesters:  First trimester: 0-12 weeks.  Second trimester: 13-27 weeks.  Third trimester: 28-40 weeks. The day when your baby is ready to be born (full term) is your estimated date of delivery. How does my baby  develop month by month? First month  The fertilized egg attaches to the inside of the uterus.  Some cells will form the placenta. Others will form the fetus.  The arms, legs, brain, spinal cord, lungs, and heart begin to develop.  At the end of the first month, the heart begins to beat. Second month  The bones, inner ear, eyelids, hands, and feet form.  The genitals develop.  By the end of 8 weeks, all major organs are developing. Third month  All of the internal organs are forming.  Teeth develop below the gums.  Bones and muscles begin to grow. The spine can flex.  The skin is transparent.  Fingernails and toenails begin to form.  Arms and legs continue to grow longer, and hands and feet develop.  The fetus is about 3 inches (7.6 cm) long. Fourth month  The placenta is completely formed.  The external sex organs, neck, outer ear, eyebrows, eyelids, and fingernails are formed.  The fetus can hear, swallow, and move its arms and legs.  The kidneys begin to produce urine.  The skin is covered with a white, waxy coating (vernix) and very fine hair (lanugo). Fifth month  The fetus moves around more and can be felt for the first time (quickening).  The fetus starts to sleep and wake up and may begin to suck its finger.  The nails grow to the end of the fingers.  The organ in the digestive system that makes bile (gallbladder) functions and helps to digest nutrients.  If your baby is a girl, eggs  are present in her ovaries. If your baby is a boy, testicles start to move down into his scrotum. Sixth month  The lungs are formed.  The eyes open. The brain continues to develop.  Your baby has fingerprints and toe prints. Your baby's hair grows thicker.  At the end of the second trimester, the fetus is about 9 inches (22.9 cm) long. Seventh month  The fetus kicks and stretches.  The eyes are developed enough to sense changes in light.  The hands can make a  grasping motion.  The fetus responds to sound. Eighth month  All organs and body systems are fully developed and functioning.  Bones harden, and taste buds develop. The fetus may hiccup.  Certain areas of the brain are still developing. The skull remains soft. Ninth month  The fetus gains about  lb (0.23 kg) each week.  The lungs are fully developed.  Patterns of sleep develop.  The fetus's head typically moves into a head-down position (vertex) in the uterus to prepare for birth.  The fetus weighs 6-9 lb (2.72-4.08 kg) and is 19-20 inches (48.26-50.8 cm) long. What can I do to have a healthy pregnancy and help my baby develop? General instructions  Take prenatal vitamins as directed by your health care provider. These include vitamins such as folic acid, iron, calcium, and vitamin D. They are important for healthy development.  Take medicines only as directed by your health care provider. Read labels and ask a pharmacist or your health care provider whether over-the-counter medicines, supplements, and prescription drugs are safe to take during pregnancy.  Keep all follow-up visits as directed by your health care provider. This is important. Follow-up visits include prenatal care and screening tests. How do I know if my baby is developing well? At each prenatal visit, your health care provider will do several different tests to check on your health and keep track of your baby's development. These include:  Fundal height and position. ? Your health care provider will measure your growing belly from your pubic bone to the top of the uterus using a tape measure. ? Your health care provider will also feel your belly to determine your baby's position.  Heartbeat. ? An ultrasound in the first trimester can confirm pregnancy and show a heartbeat, depending on how far along you are. ? Your health care provider will check your baby's heart rate at every prenatal visit.  Second  trimester ultrasound. ? This ultrasound checks your baby's development. It also may show your baby's gender. What should I do if I have concerns about my baby's development? Always talk with your health care provider about any concerns that you may have about your pregnancy and your baby. Summary  A pregnancy usually lasts 280 days, or about 40 weeks. Pregnancy is divided into three periods of growth, also called trimesters.  Your health care provider will monitor your baby's growth and development throughout your pregnancy.  Follow your health care provider's recommendations about taking prenatal vitamins and medicines during your pregnancy.  Talk with your health care provider if you have any concerns about your pregnancy or your developing baby. This information is not intended to replace advice given to you by your health care provider. Make sure you discuss any questions you have with your health care provider. Document Released: 08/17/2007 Document Revised: 01/11/2017 Document Reviewed: 01/11/2017 Elsevier Interactive Patient Education  2019 [place] of Pregnancy  The first trimester of pregnancy is from week 1 until  the end of week 13 (months 1 through 3). During this time, your baby will begin to develop inside you. At 6-8 weeks, the eyes and face are formed, and the heartbeat can be seen on ultrasound. At the end of 12 weeks, all the baby's organs are formed. Prenatal care is all the medical care you receive before the birth of your baby. Make sure you get good prenatal care and follow all of your doctor's instructions. Follow these instructions at home: Medicines  Take over-the-counter and prescription medicines only as told by your doctor. Some medicines are safe and some medicines are not safe during pregnancy.  Take a prenatal vitamin that contains at least 600 micrograms (mcg) of folic acid.  If you have trouble pooping (constipation), take medicine that  will make your stool soft (stool softener) if your doctor approves. Eating and drinking   Eat regular, healthy meals.  Your doctor will tell you the amount of weight gain that is right for you.  Avoid raw meat and uncooked cheese.  If you feel sick to your stomach (nauseous) or throw up (vomit): ? Eat 4 or 5 small meals a day instead of 3 large meals. ? Try eating a few soda crackers. ? Drink liquids between meals instead of during meals.  To prevent constipation: ? Eat foods that are high in fiber, like fresh fruits and vegetables, whole grains, and beans. ? Drink enough fluids to keep your pee (urine) clear or pale yellow. Activity  Exercise only as told by your doctor. Stop exercising if you have cramps or pain in your lower belly (abdomen) or low back.  Do not exercise if it is too hot, too humid, or if you are in a place of great height (high altitude).  Try to avoid standing for long periods of time. Move your legs often if you must stand in one place for a long time.  Avoid heavy lifting.  Wear low-heeled shoes. Sit and stand up straight.  You can have sex unless your doctor tells you not to. Relieving pain and discomfort  Wear a good support bra if your breasts are sore.  Take warm water baths (sitz baths) to soothe pain or discomfort caused by hemorrhoids. Use hemorrhoid cream if your doctor says it is okay.  Rest with your legs raised if you have leg cramps or low back pain.  If you have puffy, bulging veins (varicose veins) in your legs: ? Wear support hose or compression stockings as told by your doctor. ? Raise (elevate) your feet for 15 minutes, 3-4 times a day. ? Limit salt in your food. Prenatal care  Schedule your prenatal visits by the twelfth week of pregnancy.  Write down your questions. Take them to your prenatal visits.  Keep all your prenatal visits as told by your doctor. This is important. Safety  Wear your seat belt at all times when  driving.  Make a list of emergency phone numbers. The list should include numbers for family, friends, the hospital, and police and fire departments. General instructions  Ask your doctor for a referral to a local prenatal class. Begin classes no later than at the start of month 6 of your pregnancy.  Ask for help if you need counseling or if you need help with nutrition. Your doctor can give you advice or tell you where to go for help.  Do not use hot tubs, steam rooms, or saunas.  Do not douche or use tampons or scented sanitary pads.  Do not cross your legs for long periods of time.  Avoid all herbs and alcohol. Avoid drugs that are not approved by your doctor.  Do not use any tobacco products, including cigarettes, chewing tobacco, and electronic cigarettes. If you need help quitting, ask your doctor. You may get counseling or other support to help you quit.  Avoid cat litter boxes and soil used by cats. These carry germs that can cause birth defects in the baby and can cause a loss of your baby (miscarriage) or stillbirth.  Visit your dentist. At home, brush your teeth with a soft toothbrush. Be gentle when you floss. Contact a doctor if:  You are dizzy.  You have mild cramps or pressure in your lower belly.  You have a nagging pain in your belly area.  You continue to feel sick to your stomach, you throw up, or you have watery poop (diarrhea).  You have a bad smelling fluid coming from your vagina.  You have pain when you pee (urinate).  You have increased puffiness (swelling) in your face, hands, legs, or ankles. Get help right away if:  You have a fever.  You are leaking fluid from your vagina.  You have spotting or bleeding from your vagina.  You have very bad belly cramping or pain.  You gain or lose weight rapidly.  You throw up blood. It may look like coffee grounds.  You are around people who have [place] measles, fifth disease, or chickenpox.  You have  a very bad headache.  You have shortness of breath.  You have any kind of trauma, such as from a fall or a car accident. Summary  The first trimester of pregnancy is from week 1 until the end of week 13 (months 1 through 3).  To take care of yourself and your unborn baby, you will need to eat healthy meals, take medicines only if your doctor tells you to do so, and do activities that are safe for you and your baby.  Keep all follow-up visits as told by your doctor. This is important as your doctor will have to ensure that your baby is healthy and growing well. This information is not intended to replace advice given to you by your health care provider. Make sure you discuss any questions you have with your health care provider. Document Released: 08/17/2007 Document Revised: 03/08/2016 Document Reviewed: 03/08/2016 Elsevier Interactive Patient Education  2019 [employer]. Commonly Asked Questions During Pregnancy  Cats: A parasite can be excreted in cat feces.  To avoid exposure you need to have another person empty the little box.  If you must empty the litter box you will need to wear gloves.  Wash your hands after handling your cat.  This parasite can also be found in raw or undercooked meat so this should also be avoided.  Colds, Sore Throats, Flu: Please check your medication sheet to see what you can take for symptoms.  If your symptoms are unrelieved by these medications please call the office.  Dental Work: Most any dental work Investment banker, corporate recommends is permitted.  X-rays should only be taken during the first trimester if absolutely necessary.  Your abdomen should be shielded with a lead apron during all x-rays.  Please notify your provider prior to receiving any x-rays.  Novocaine is fine; gas is not recommended.  If your dentist requires a note from [place] prior to dental work please call the office and we will provide one for you.  Exercise:  Exercise is an important part of staying  healthy during your pregnancy.  You may continue most exercises you were accustomed to prior to pregnancy.  Later in your pregnancy you will most likely notice you have difficulty with activities requiring balance like riding a bicycle.  It is important that you listen to your body and avoid activities that put you at a higher risk of falling.  Adequate rest and staying well hydrated are a must!  If you have questions about the safety of specific activities ask your provider.    Exposure to Children with illness: Try to avoid obvious exposure; report any symptoms to [place] when noted,  If you have chicken pos, red measles or mumps, you should be immune to these diseases.   Please do not take any vaccines while pregnant unless you have checked with your OB provider.  Fetal Movement: After 28 weeks we recommend you do "kick counts" twice daily.  Lie or sit down in a calm quiet environment and count your baby movements "kicks".  You should feel your baby at least 10 times per hour.  If you have not felt 10 kicks within the first hour get up, walk around and have something sweet to eat or drink then repeat for an additional hour.  If count remains less than 10 per hour notify your provider.  Fumigating: Follow your pest control agent's advice as to how long to stay out of your home.  Ventilate the area well before re-entering.  Hemorrhoids:   Most over-the-counter preparations can be used during pregnancy.  Check your medication to see what is safe to use.  It is important to use a stool softener or fiber in your diet and to drink lots of liquids.  If hemorrhoids seem to be getting worse please call the office.   Hot Tubs:  Hot tubs Jacuzzis and saunas are not recommended while pregnant.  These increase your internal body temperature and should be avoided.  Intercourse:  Sexual intercourse is safe during pregnancy as long as you are comfortable, unless otherwise advised by your provider.  Spotting may occur  after intercourse; report any bright red bleeding that is heavier than spotting.  Labor:  If you know that you are in labor, please go to the hospital.  If you are unsure, please call the office and let us help you decide what to do.  Lifting, straining, etc:  If your job requires heavy lifting or straining please check with your provider for any limitations.  Generally, you should not lift items heavier than that you can lift simply with your hands and arms (no back muscles)  Painting:  Paint fumes do not harm your pregnancy, but may make you ill and should be avoided if possible.  Latex or water based paints have less odor than oils.  Use adequate ventilation while painting.  Permanents & Hair Color:  Chemicals in hair dyes are not recommended as they cause increase hair dryness which can increase hair loss during pregnancy.  " Highlighting" and permanents are allowed.  Dye may be absorbed differently and permanents may not hold as well during pregnancy.  Sunbathing:  Use a sunscreen, as skin burns easily during pregnancy.  Drink plenty of fluids; avoid over heating.  Tanning Beds:  Because their possible side effects are still unknown, tanning beds are not recommended.  Ultrasound Scans:  Routine ultrasounds are performed at approximately 20 weeks.  You will be able to see your baby's general anatomy an if  you would like to know the gender this can usually be determined as well.  If it is questionable when you conceived you may also receive an ultrasound early in your pregnancy for dating purposes.  Otherwise ultrasound exams are not routinely performed unless there is a medical necessity.  Although you can request a scan we ask that you pay for it when conducted because insurance does not cover " patient request" scans.  Work: If your pregnancy proceeds without complications you may work until your due date, unless your physician or employer advises otherwise.  Round Ligament Pain/Pelvic  Discomfort:  Sharp, shooting pains not associated with bleeding are fairly common, usually occurring in the second trimester of pregnancy.  They tend to be worse when standing up or when you remain standing for long periods of time.  These are the result of pressure of certain pelvic ligaments called "round ligaments".  Rest, Tylenol and heat seem to be the most effective relief.  As the womb and fetus grow, they rise out of the pelvis and the discomfort improves.  Please notify the office if your pain seems different than that described.  It may represent a more serious condition.  Common Medications Safe in Pregnancy  Acne:      Constipation:  Benzoyl Peroxide     Colace  Clindamycin      Dulcolax Suppository  Topica Erythromycin     Fibercon  Salicylic Acid      Metamucil         Miralax AVOID:        Senakot   Accutane    Cough:  Retin-A       Cough Drops  Tetracycline      Phenergan w/ Codeine if Rx  Minocycline      Robitussin (Plain & DM)  Antibiotics:     Crabs/Lice:  Ceclor       RID  Cephalosporins    AVOID:  E-Mycins      Kwell  Keflex  Macrobid/Macrodantin   Diarrhea:  Penicillin      Kao-Pectate  Zithromax      Imodium AD         PUSH FLUIDS AVOID:       Cipro     Fever:  Tetracycline      Tylenol (Regular or Extra  Minocycline       Strength)  Levaquin      Extra Strength-Do not          Exceed 8 tabs/24 hrs Caffeine:        <234m/day (equiv. To 1 cup of coffee or  approx. 3 12 oz sodas)         Gas: Cold/Hayfever:       Gas-X  Benadryl      Mylicon  Claritin       Phazyme  **Claritin-D        Chlor-Trimeton    Headaches:  Dimetapp      ASA-Free Excedrin  Drixoral-Non-Drowsy     Cold Compress  Mucinex (Guaifenasin)     Tylenol (Regular or Extra  Sudafed/Sudafed-12 Hour     Strength)  **Sudafed PE Pseudoephedrine   Tylenol Cold & Sinus     Vicks Vapor Rub  Zyrtec  **AVOID if Problems With Blood Pressure         Heartburn: Avoid lying down for at  least 1 hour after meals  Aciphex      Maalox     Rash:  Milk of Magnesia  Benadryl    Mylanta       1% Hydrocortisone Cream  Pepcid  Pepcid Complete   Sleep Aids:  Prevacid      Ambien   Prilosec       Benadryl  Rolaids       Chamomile Tea  Tums (Limit 4/day)     Unisom  Zantac       Tylenol PM         Warm milk-add vanilla or  Hemorrhoids:       Sugar for taste  Anusol/Anusol H.C.  (RX: Analapram 2.5%)  Sugar Substitutes:  Hydrocortisone OTC     Ok in moderation  Preparation H      Tucks        Vaseline lotion applied to tissue with wiping    Herpes:     Throat:  Acyclovir      Oragel  Famvir  Valtrex     Vaccines:         Flu Shot Leg Cramps:       *Gardasil  Benadryl      Hepatitis A         Hepatitis B Nasal Spray:       Pneumovax  Saline Nasal Spray     Polio Booster         Tetanus Nausea:       Tuberculosis test or PPD  Vitamin B6 25 mg TID   AVOID:    Dramamine      *Gardasil  Emetrol       Live Poliovirus  Ginger Root 250 mg QID    MMR (measles, mumps &  High Complex Carbs @ Bedtime    rebella)  Sea Bands-Accupressure    Varicella (Chickenpox)  Unisom 1/2 tab TID     *No known complications           If received before Pain:         Known pregnancy;   Darvocet       Resume series after  Lortab        Delivery  Percocet    Yeast:   Tramadol      Femstat  Tylenol 3      Gyne-lotrimin  Ultram       Monistat  Vicodin           MISC:         All Sunscreens           Hair Coloring/highlights          Insect Repellant's          (Including DEET)         Mystic Tans

## 2018-04-26 ENCOUNTER — Ambulatory Visit (INDEPENDENT_AMBULATORY_CARE_PROVIDER_SITE_OTHER): Payer: BLUE CROSS/BLUE SHIELD | Admitting: Certified Nurse Midwife

## 2018-04-26 VITALS — BP 127/82 | HR 90 | Ht 65.0 in | Wt 140.8 lb

## 2018-04-26 DIAGNOSIS — Z0283 Encounter for blood-alcohol and blood-drug test: Secondary | ICD-10-CM

## 2018-04-26 DIAGNOSIS — Z113 Encounter for screening for infections with a predominantly sexual mode of transmission: Secondary | ICD-10-CM

## 2018-04-26 DIAGNOSIS — Z3401 Encounter for supervision of normal first pregnancy, first trimester: Secondary | ICD-10-CM

## 2018-04-26 LAB — OB RESULTS CONSOLE VARICELLA ZOSTER ANTIBODY, IGG: Varicella: IMMUNE

## 2018-04-26 NOTE — Progress Notes (Signed)
      Jamie Dudley presents for NOB nurse intake visit. Pregnancy confirmation done at [facility], 04/06/2018, with Doreene Burke.  G1.  P0.  LMP 02/15/18.  EDD9/5/20.  [place]. Pregnancy education material explained and given.   2 cats in the home.  NOB labs ordered. BMI less than 30. TSH/HbgA1c not ordered. HIV and drug screen explained and ordered. Genetic screening discussed. Genetic testing; Unsure. Pt to discuss genetic testing with provider. PNV encouraged. Pt to follow up with provider in 2 weeks for NOB physical. [facility] Financial Policy and FMLA form revied and signed by pt. Pt stated that she understood.   BP 127/82   Pulse 90   Ht 5\' 5"  (1.651 m)   Wt 140 lb 12.8 oz (63.9 kg)   LMP 02/15/2018 (Exact Date)   BMI 23.43 kg/m

## 2018-04-27 LAB — URINALYSIS, ROUTINE W REFLEX MICROSCOPIC
Bilirubin, UA: NEGATIVE
GLUCOSE, UA: NEGATIVE
Ketones, UA: NEGATIVE
Leukocytes, UA: NEGATIVE
Nitrite, UA: NEGATIVE
PH UA: 8 — AB (ref 5.0–7.5)
Protein, UA: NEGATIVE
RBC, UA: NEGATIVE
Specific Gravity, UA: 1.021 (ref 1.005–1.030)
UUROB: 0.2 mg/dL (ref 0.2–1.0)

## 2018-04-27 LAB — DRUG PROFILE, UR, 9 DRUGS (LABCORP)
Amphetamines, Urine: NEGATIVE ng/mL
Barbiturate Quant, Ur: NEGATIVE ng/mL
Benzodiazepine Quant, Ur: NEGATIVE ng/mL
Cannabinoid Quant, Ur: NEGATIVE ng/mL
Cocaine (Metab.): NEGATIVE ng/mL
Methadone Screen, Urine: NEGATIVE ng/mL
Opiate Quant, Ur: NEGATIVE ng/mL
PCP Quant, Ur: NEGATIVE ng/mL
Propoxyphene: NEGATIVE ng/mL

## 2018-04-27 LAB — HEPATITIS B SURFACE ANTIGEN: Hepatitis B Surface Ag: NEGATIVE

## 2018-04-27 LAB — RPR: RPR Ser Ql: NONREACTIVE

## 2018-04-27 LAB — NICOTINE SCREEN, URINE: Cotinine Ql Scrn, Ur: NEGATIVE ng/mL

## 2018-04-27 LAB — ABO AND RH: Rh Factor: POSITIVE

## 2018-04-27 LAB — TOXOPLASMA ANTIBODIES- IGG AND  IGM
Toxoplasma Antibody- IgM: <3 AU/mL (ref 0.0–7.9)
Toxoplasma IgG Ratio: <3 IU/mL (ref 0.0–7.1)

## 2018-04-27 LAB — ANTIBODY SCREEN: Antibody Screen: NEGATIVE

## 2018-04-27 LAB — RUBELLA SCREEN: Rubella Antibodies, IGG: 1.28 index (ref >0.99)

## 2018-04-27 LAB — HIV ANTIBODY (ROUTINE TESTING W REFLEX): HIV SCREEN 4TH GENERATION: NONREACTIVE

## 2018-04-27 LAB — VARICELLA ZOSTER ANTIBODY, IGG: Varicella zoster IgG: 410 index (ref >165)

## 2018-04-28 LAB — URINE CULTURE, OB REFLEX

## 2018-04-28 LAB — CULTURE, OB URINE

## 2018-04-29 LAB — GC/CHLAMYDIA PROBE AMP
Chlamydia trachomatis, NAA: NEGATIVE
Neisseria gonorrhoeae by PCR: NEGATIVE

## 2018-05-11 ENCOUNTER — Ambulatory Visit (INDEPENDENT_AMBULATORY_CARE_PROVIDER_SITE_OTHER): Payer: BLUE CROSS/BLUE SHIELD | Admitting: Certified Nurse Midwife

## 2018-05-11 ENCOUNTER — Encounter: Payer: Self-pay | Admitting: Certified Nurse Midwife

## 2018-05-11 VITALS — BP 127/82 | HR 76 | Wt 138.1 lb

## 2018-05-11 DIAGNOSIS — O09511 Supervision of elderly primigravida, first trimester: Secondary | ICD-10-CM

## 2018-05-11 DIAGNOSIS — Z3401 Encounter for supervision of normal first pregnancy, first trimester: Secondary | ICD-10-CM

## 2018-05-11 DIAGNOSIS — Z3A12 12 weeks gestation of pregnancy: Secondary | ICD-10-CM

## 2018-05-11 LAB — POCT URINALYSIS DIPSTICK OB
Bilirubin, UA: NEGATIVE
Blood, UA: NEGATIVE
Glucose, UA: NEGATIVE
KETONES UA: NEGATIVE
Leukocytes, UA: NEGATIVE
Nitrite, UA: NEGATIVE
POC,PROTEIN,UA: NEGATIVE
SPEC GRAV UA: 1.01 (ref 1.010–1.025)
Urobilinogen, UA: 0.2 E.U./dL
pH, UA: 7.5 (ref 5.0–8.0)

## 2018-05-11 NOTE — Patient Instructions (Signed)
Eating Plan for Pregnant Women While you are pregnant, your body requires additional nutrition to help support your growing baby. You also have a higher need for some vitamins and minerals, such as folic acid, calcium, iron, and vitamin D. Eating a healthy, well-balanced diet is very important for your health and your baby's health. Your need for extra calories varies for the three 76-monthsegments of your pregnancy (trimesters). For most women, it is recommended to consume:  150 extra calories a day during the first trimester.  300 extra calories a day during the second trimester.  300 extra calories a day during the third trimester. What are tips for following this plan?   Do not try to lose weight or go on a diet during pregnancy.  Limit your overall intake of foods that have "empty calories." These are foods that have little nutritional value, such as sweets, desserts, candies, and sugar-sweetened beverages.  Eat a variety of foods (especially fruits and vegetables) to get a full range of vitamins and minerals.  Take a prenatal vitamin to help meet your additional vitamin and mineral needs during pregnancy, specifically for folic acid, iron, calcium, and vitamin D.  Remember to stay active. Ask your health care provider what types of exercise and activities are safe for you.  Practice good food safety and cleanliness. Wash your hands before you eat and after you prepare raw meat. Wash all fruits and vegetables well before peeling or eating. Taking these actions can help to prevent food-borne illnesses that can be very dangerous to your baby, such as listeriosis. Ask your health care provider for more information about listeriosis. What does 150 extra calories look like? Healthy options that provide 150 extra calories each day could be any of the following:  6-8 oz (170-230 g) of plain low-fat yogurt with  cup of berries.  1 apple with 2 teaspoons (11 g) of peanut butter.  Cut-up  vegetables with  cup (60 g) of hummus.  8 oz (230 mL) or 1 cup of low-fat chocolate milk.  1 stick of string cheese with 1 medium orange.  1 peanut butter and jelly sandwich that is made with one slice of whole-wheat bread and 1 tsp (5 g) of peanut butter. For 300 extra calories, you could eat two of those healthy options each day. What is a healthy amount of weight to gain? The right amount of weight gain for you is based on your BMI before you became pregnant. If your BMI:  Was less than 18 (underweight), you should gain 28-40 lb (13-18 kg).  Was 18-24.9 (normal), you should gain 25-35 lb (11-16 kg).  Was 25-29.9 (overweight), you should gain 15-25 lb (7-11 kg).  Was 30 or greater (obese), you should gain 11-20 lb (5-9 kg). What if I am having twins or multiples? Generally, if you are carrying twins or multiples:  You may need to eat 300-600 extra calories a day.  The recommended range for total weight gain is 25-54 lb (11-25 kg), depending on your BMI before pregnancy.  Talk with your health care provider to find out about nutritional needs, weight gain, and exercise that is right for you. What foods can I eat?  Grains All grains. Choose whole grains, such as whole-wheat bread, oatmeal, or brown rice. Vegetables All vegetables. Eat a variety of colors and types of vegetables. Remember to wash your vegetables well before peeling or eating. Fruits All fruits. Eat a variety of colors and types of fruit. Remember to wash  your fruits well before peeling or eating. Meats and other protein foods Lean meats, including chicken, [place], fish, and lean cuts of beef, veal, or pork. If you eat fish or seafood, choose options that are higher in omega-3 fatty acids and lower in mercury, such as salmon, herring, mussels, trout, sardines, pollock, shrimp, crab, and lobster. Tofu. Tempeh. Beans. Eggs. Peanut butter and other nut butters. Make sure that all meats, poultry, and eggs are cooked to  food-safe temperatures or "well-done." Two or more servings of fish are recommended each week in order to get the most benefits from omega-3 fatty acids that are found in seafood. Choose fish that are lower in mercury. You can find more information online:  [place]Gaming.ch Dairy Pasteurized milk and milk alternatives (such as almond milk). Pasteurized yogurt and pasteurized cheese. Cottage cheese. Sour cream. Beverages Water. Juices that contain 100% fruit juice or vegetable juice. Caffeine-free teas and decaffeinated coffee. Drinks that contain caffeine are okay to drink, but it is better to avoid caffeine. Keep your total caffeine intake to less than 200 mg each day (which is 12 oz or 355 mL of coffee, tea, or soda) or the limit as told by your health care provider. Fats and oils Fats and oils are okay to include in moderation. Sweets and desserts Sweets and desserts are okay to include in moderation. Seasoning and other foods All pasteurized condiments. The items listed above may not be a complete list of recommended foods and beverages. Contact your dietitian for more options. What foods are not recommended? Vegetables Raw (unpasteurized) vegetable juices. Fruits Unpasteurized fruit juices. Meats and other protein foods Lunch meats, bologna, hot dogs, or other deli meats. (If you must eat those meats, reheat them until they are steaming hot.) Refrigerated pat, meat spreads from a meat counter, smoked seafood that is found in the refrigerated section of a store. Raw or undercooked meats, poultry, and eggs. Raw fish, such as sushi or sashimi. Fish that have high mercury content, such as tilefish, shark, swordfish, and king mackerel. To learn more about mercury in fish, talk with your health care provider or look for online resources, such as:  [place]Gaming.ch Dairy Raw (unpasteurized) milk and any foods that have raw milk in them. Soft cheeses, such as feta, queso blanco, queso fresco, Brie,  Camembert cheeses, blue-veined cheeses, and Panela cheese (unless it is made with pasteurized milk, which must be stated on the label). Beverages Alcohol. Sugar-sweetened beverages, such as sodas, teas, or energy drinks. Seasoning and other foods Homemade fermented foods and drinks, such as pickles, sauerkraut, or kombucha drinks. (Store-bought pasteurized versions of these are okay.) Salads that are made in a store or deli, such as ham salad, chicken salad, egg salad, tuna salad, and seafood salad. The items listed above may not be a complete list of foods and beverages to avoid. Contact your dietitian for more information. Where to find more information To calculate the number of calories you need based on your height, weight, and activity level, you can use an online calculator such as:  MobileTransition.ch To calculate how much weight you should gain during pregnancy, you can use an online pregnancy weight gain calculator such as:  StreamingFood.com.cy Summary  While you are pregnant, your body requires additional nutrition to help support your growing baby.  Eat a variety of foods, especially fruits and vegetables to get a full range of vitamins and minerals.  Practice good food safety and cleanliness. Wash your hands before you eat and after you  www.choosemyplate.gov/MyPlatePlan  To calculate how much weight you should gain during pregnancy, you can use an online pregnancy weight gain calculator such as:  · www.choosemyplate.gov/pregnancy-weight-gain-calculator  Summary  · While you are pregnant, your body requires additional nutrition to help support your growing baby.  · Eat a variety of foods, especially fruits and vegetables to get a full range of vitamins and minerals.  · Practice good food safety and cleanliness. Wash your hands before you eat and after you prepare raw meat. Wash all fruits and vegetables well before peeling or eating. Taking these actions can help to prevent food-borne illnesses, such as listeriosis, that can be very dangerous to your baby.  · Do not eat raw meat or fish. Do not eat fish that have high mercury content, such as tilefish, shark, swordfish, and king mackerel. Do not eat unpasteurized (raw) dairy.  · Take a prenatal vitamin to help meet your additional vitamin and mineral needs during pregnancy, specifically for folic acid, iron, calcium, and vitamin D.  This information is not intended to replace advice given to you by your  health care provider. Make sure you discuss any questions you have with your health care provider.  Document Released: 12/13/2013 Document Revised: 11/25/2016 Document Reviewed: 11/25/2016  Elsevier Interactive Patient Education © 2019 Elsevier Inc.    Prenatal Care  Prenatal care is health care during pregnancy. It helps you and your unborn baby (fetus) stay as healthy as possible. Prenatal care may be provided by a midwife, a family practice health care provider, or a childbirth and pregnancy specialist (obstetrician).  How does this affect me?  During pregnancy, you will be closely monitored for any new conditions that might develop. To lower your risk of pregnancy complications, you and your health care provider will talk about any underlying conditions you have.  How does this affect my baby?  Early and consistent prenatal care increases the chance that your baby will be healthy during pregnancy. Prenatal care lowers the risk that your baby will be:  · Born early (prematurely).  · Smaller than expected at birth (small for gestational age).  What can I expect at the first prenatal care visit?  Your first prenatal care visit will likely be the longest. You should schedule your first prenatal care visit as soon as you know that you are pregnant. Your first visit is a good time to talk about any questions or concerns you have about pregnancy. At your visit, you and your health care provider will talk about:  · Your medical history, including:  ? Any past pregnancies.  ? Your family's medical history.  ? The baby's father's medical history.  ? Any long-term (chronic) health conditions you have and how you manage them.  ? Any surgeries or procedures you have had.  ? Any current over-the-counter or prescription medicines, herbs, or supplements you are taking.  · Other factors that could pose a risk to your baby, including:  · Your home setting and your stress levels, including:  ? Exposure to abuse or  violence.  ? Household financial strain.  ? Mental health conditions you have.  · Your daily health habits, including diet and exercise.  Your health care provider will also:  · Measure your weight, height, and blood pressure.  · Do a physical exam, including a pelvic and breast exam.  · Perform blood tests and urine tests to check for:  ? Urinary tract infection.  ? Sexually transmitted infections (STIs).  ? Low iron levels   your baby and how to avoid them. ? Food safety. ? Dental care. ? Working. ? Travel. ? Warning signs to watch for and when to call your health care provider. How often will I have prenatal care visits? After your first prenatal care visit, you will have regular visits throughout your pregnancy. The visit schedule is often as follows:  Up to week 28 of pregnancy: once every 4 weeks.  28-36 weeks: once every 2 weeks.  After 36 weeks: every week until delivery. Some women may have visits more or less often depending on any underlying health conditions and the health of the baby. Keep all follow-up and prenatal care visits as told by your health care provider. This is important. What happens during routine prenatal care visits? Your health care provider  will:  Measure your weight and blood pressure.  Check for fetal heart sounds.  Measure the height of your uterus in your abdomen (fundal height). This may be measured starting around week 20 of pregnancy.  Check the position of your baby inside your uterus.  Ask questions about your diet, sleeping patterns, and whether you can feel the baby move.  Review warning signs to watch for and signs of labor.  Ask about any pregnancy symptoms you are having and how you are dealing with them. Symptoms may include: ? Headaches. ? Nausea and vomiting. ? Vaginal discharge. ? Swelling. ? Fatigue. ? Constipation. ? Any discomfort, including back or pelvic pain. Make a list of questions to ask your health care provider at your routine visits. What tests might I have during prenatal care visits? You may have blood, urine, and imaging tests throughout your pregnancy, such as:  Urine tests to check for glucose, protein, or signs of infection.  Glucose tests to check for a form of diabetes that can develop during pregnancy (gestational diabetes mellitus). This is usually done around week 24 of pregnancy.  An ultrasound to check your baby's growth and development and to check for birth defects. This is usually done around week 20 of pregnancy.  A test to check for group B strep (GBS) infection. This is usually done around week 36 of pregnancy.  Genetic testing. This may include blood or imaging tests, such as an ultrasound. Some genetic tests are done during the first trimester and some are done during the second trimester. What else can I expect during prenatal care visits? Your health care provider may recommend getting certain vaccines during pregnancy. These may include:  A yearly flu shot (annual influenza vaccine). This is especially important if you will be pregnant during flu season.  Tdap (tetanus, diphtheria, pertussis) vaccine. Getting this vaccine during pregnancy can protect your baby  from whooping cough (pertussis) after birth. This vaccine may be recommended between weeks 27 and 36 of pregnancy. Later in your pregnancy, your health care provider may give you information about:  Childbirth and breastfeeding classes.  Choosing a health care provider for your baby.  Umbilical cord banking.  Breastfeeding.  Birth control after your baby is born.  The hospital labor and delivery unit and how to tour it.  Registering at the hospital before you go into labor. Where to find more information  Office on Women's Health: TravelLesson.ca  American Pregnancy Association: americanpregnancy.org  March of Dimes: marchofdimes.org Summary  Prenatal care helps you and your baby stay as healthy as possible during pregnancy.  Your first prenatal care visit will most likely be the longest.  You will have visits and tests throughout  influenza vaccine). This is especially important if you will be pregnant during flu season.  · Tdap (tetanus, diphtheria, pertussis) vaccine. Getting this vaccine during pregnancy can protect your baby  from whooping cough (pertussis) after birth. This vaccine may be recommended between weeks 27 and 36 of pregnancy.  Later in your pregnancy, your health care provider may give you information about:  · Childbirth and breastfeeding classes.  · Choosing a health care provider for your baby.  · Umbilical cord banking.  · Breastfeeding.  · Birth control after your baby is born.  · The hospital labor and delivery unit and how to tour it.  · Registering at the hospital before you go into labor.  Where to find more information  · Office on Women's Health: womenshealth.gov  · American Pregnancy Association: americanpregnancy.org  · March of Dimes: marchofdimes.org  Summary  · Prenatal care helps you and your baby stay as healthy as possible during pregnancy.  · Your first prenatal care visit will most likely be the longest.  · You will have visits and tests throughout your pregnancy to monitor your health and your baby's health.  · Bring a list of questions to your visits to ask your health care provider.  · Make sure to keep all follow-up and prenatal care visits with your health care provider.  This information is not intended to replace advice given to you by your health care provider. Make sure you discuss any questions you have with your health care provider.  Document Released: 03/03/2003 Document Revised: 02/27/2017 Document Reviewed: 02/27/2017  Elsevier Interactive Patient Education © 2019 Elsevier Inc.

## 2018-05-11 NOTE — Progress Notes (Signed)
NEW OB HISTORY AND PHYSICAL  SUBJECTIVE:       Jamie Dudley is a 38 y.o. [id number] female, Patient's last menstrual period was 02/15/2018 (exact date)., Estimated Date of Delivery: 11/17/18, [redacted]w[redacted]d, presents today for establishment of Prenatal Care. She has no unusual complaints.    Gynecologic History Patient's last menstrual period was 02/15/2018 (exact date). Normal Contraception: none Last Pap: 09/08/16. Results were: normal, HPV negative  Obstetric History OB History  Gravida Para Term Preterm AB Living  1 0 0 0 0 0  SAB TAB Ectopic Multiple Live Births  0 0 0 0 0    # Outcome Date GA Lbr Len/2nd Weight Sex Delivery Anes PTL Lv  1 Current             Past Medical History:  Diagnosis Date  . Atypical squamous cells of undetermined significance (ASC-US) on cervical Pap smear   . Depression   . Sinusitis   . Skin-picking disorder     Past Surgical History:  Procedure Laterality Date  . COLPOSCOPY    . WISDOM TOOTH EXTRACTION      Current Outpatient Medications on File Prior to Visit  Medication Sig Dispense Refill  . lidocaine (XYLOCAINE) 2 % solution Use as directed 10 mLs in the mouth or throat every 8 (eight) hours as needed (sore throat. gargle and spit as needed for sore throat.). 100 mL 0  . Prenatal Vit-Fe Fumarate-FA (PRENATAL MULTIVITAMIN) TABS tablet Take 1 tablet by mouth daily at 12 noon.     No current facility-administered medications on file prior to visit.     No Known Allergies  Social History   Socioeconomic History  . Marital status: Single    Spouse name: Not on file  . Number of children: Not on file  . Years of education: Not on file  . Highest education level: Not on file  Occupational History  . Not on file  Social Needs  . Financial resource strain: Not on file  . Food insecurity:    Worry: Not on file    Inability: Not on file  . Transportation needs:    Medical: Not on file    Non-medical: Not on file  Tobacco Use  . Smoking  status: Never Smoker  . Smokeless tobacco: Never Used  Substance and Sexual Activity  . Alcohol use: Not Currently    Comment: rare  . Drug use: No  . Sexual activity: Yes    Birth control/protection: None  Lifestyle  . Physical activity:    Days per week: Not on file    Minutes per session: Not on file  . Stress: Not on file  Relationships  . Social connections:    Talks on phone: Not on file    Gets together: Not on file    Attends religious service: Not on file    Active member of club or organization: Not on file    Attends meetings of clubs or organizations: Not on file    Relationship status: Not on file  . Intimate partner violence:    Fear of current or ex partner: Not on file    Emotionally abused: Not on file    Physically abused: Not on file    Forced sexual activity: Not on file  Other Topics Concern  . Not on file  Social History Narrative  . Not on file    Family History  Problem Relation Age of Onset  . Heart disease Paternal Grandfather   . Hypertension  Mother     The following portions of the patient's history were reviewed and updated as appropriate: allergies, current medications, past OB history, past medical history, past surgical history, past family history, past social history, and problem list.    OBJECTIVE: Initial Physical Exam (New OB)  GENERAL APPEARANCE: alert, well appearing, in no apparent distress, oriented to person, place and time HEAD: normocephalic, atraumatic MOUTH: mucous membranes moist, pharynx normal without lesions THYROID: no thyromegaly or masses present BREASTS: no masses noted, no significant tenderness, no palpable axillary nodes, no skin changes LUNGS: clear to auscultation, no wheezes, rales or rhonchi, symmetric air entry HEART: regular rate and rhythm, no murmurs ABDOMEN: soft, nontender, nondistended, no abnormal masses, no epigastric pain, fundus soft, nontender 12 weeks size and FHT present EXTREMITIES: no  redness or tenderness in the calves or thighs, no edema, no limitation in range of motion, intact peripheral pulses SKIN: normal coloration and turgor, no rashes LYMPH NODES: no adenopathy palpable NEUROLOGIC: alert, oriented, normal speech, no focal findings or movement disorder noted  PELVIC EXAM EXTERNAL GENITALIA: normal appearing vulva with no masses, tenderness or lesions VAGINA: no abnormal discharge or lesions CERVIX: no lesions or cervical motion tenderness UTERUS: gravid ADNEXA: no masses palpable and nontender OB EXAM PELVIMETRY: appears adequate RECTUM: exam not indicated  ASSESSMENT: Normal pregnancy  PLAN: New OB counseling: The patient has been given an overview regarding routine prenatal care. Recommendations regarding diet, weight gain, and exercise in pregnancy were given. Prenatal testing, optional genetic  , and carrier screening ltrasound use in pregnancy were reviewed. Cell free fetal DNA today. Benefits of Breast Feeding were discussed. The patient is encouraged to consider nursing her baby post partum.  Doreene Burke, CNM

## 2018-05-12 LAB — CBC
Hematocrit: 35.3 % (ref 34.0–46.6)
Hemoglobin: 12 g/dL (ref 11.1–15.9)
MCH: 29.9 pg (ref 26.6–33.0)
MCHC: 34 g/dL (ref 31.5–35.7)
MCV: 88 fL (ref 79–97)
Platelets: 243 10*3/uL (ref 150–450)
RBC: 4.02 x10E6/uL (ref 3.77–5.28)
RDW: 12.8 % (ref 11.7–15.4)
WBC: 9.3 10*3/uL (ref 3.4–10.8)

## 2018-06-04 ENCOUNTER — Encounter: Payer: Self-pay | Admitting: *Deleted

## 2018-06-07 ENCOUNTER — Encounter: Payer: Self-pay | Admitting: Obstetrics and Gynecology

## 2018-06-18 ENCOUNTER — Telehealth: Payer: Self-pay

## 2018-06-19 ENCOUNTER — Encounter: Payer: Self-pay | Admitting: *Deleted

## 2018-06-19 ENCOUNTER — Encounter: Payer: Self-pay | Admitting: Obstetrics and Gynecology

## 2018-06-19 ENCOUNTER — Telehealth: Payer: Self-pay | Admitting: *Deleted

## 2018-06-19 NOTE — Telephone Encounter (Signed)
Coronavirus (COVID-19) Are you at risk?  Are you at risk for the Coronavirus (COVID-19)?  To be considered HIGH RISK for Coronavirus (COVID-19), you have to meet the following criteria:  . Traveled to [place], [place], [place], [place] or [place]; or in the [place] to [place], [place], [place], or [place]; and have fever, cough, and shortness of breath within the last 2 weeks of travel OR . Been in close contact with a person diagnosed with COVID-19 within the last 2 weeks and have fever, cough, and shortness of breath . IF YOU DO NOT MEET THESE CRITERIA, YOU ARE CONSIDERED LOW RISK FOR COVID-19.  What to do if you are HIGH RISK for COVID-19?  . If you are having a medical emergency, call 911. . Seek medical care right away. Before you go to a doctor's office, urgent care or emergency department, call ahead and tell them about your recent travel, contact with someone diagnosed with COVID-19, and your symptoms. You should receive instructions from your physician's office regarding next steps of care.  . When you arrive at healthcare provider, tell the healthcare staff immediately you have returned from visiting [place], [place], [place], [place] or [place]; or traveled in the [place] to [place], [place], [place], or [place]; in the last two weeks or you have been in close contact with a person diagnosed with COVID-19 in the last 2 weeks.   . Tell the health care staff about your symptoms: fever, cough and shortness of breath. . After you have been seen by a medical provider, you will be either: o Tested for (COVID-19) and discharged home on quarantine except to seek medical care if symptoms worsen, and asked to  - Stay home and avoid contact with others until you get your results (4-5 days)  - Avoid travel on public transportation if possible (such as bus, train, or airplane) or o Sent to the Emergency Department by EMS for evaluation, COVID-19 testing, and possible  admission depending on your condition and test results.  What to do if you are LOW RISK for COVID-19?  Reduce your risk of any infection by using the same precautions used for avoiding the common cold or flu:  . Wash your hands often with soap and warm water for at least 20 seconds.  If soap and water are not readily available, use an alcohol-based hand sanitizer with at least 60% alcohol.  . If coughing or sneezing, cover your mouth and nose by coughing or sneezing into the elbow areas of your shirt or coat, into a tissue or into your sleeve (not your hands). . Avoid shaking hands with others and consider head nods or verbal greetings only. . Avoid touching your eyes, nose, or mouth with unwashed hands.  . Avoid close contact with people who are sick. . Avoid places or events with large numbers of people in one location, like concerts or sporting events. . Carefully consider travel plans you have or are making. . If you are planning any travel outside or inside the [place], visit the CDC's Travelers' Health webpage for the latest health notices. . If you have some symptoms but not all symptoms, continue to monitor at home and seek medical attention if your symptoms worsen. . If you are having a medical emergency, call 911.   ADDITIONAL HEALTHCARE OPTIONS FOR PATIENTS  [place] [facility] / e-Visit: [email or web address]         [facility]: [phone]  [place]: [phone]                   [facility]: [phone]   Spoke with pt denies any sx.  [place] , CMA 

## 2018-06-19 NOTE — Telephone Encounter (Signed)
Calling pt for screening

## 2018-06-20 ENCOUNTER — Encounter: Payer: Self-pay | Admitting: Obstetrics and Gynecology

## 2018-06-20 ENCOUNTER — Encounter: Payer: Self-pay | Admitting: Family Medicine

## 2018-06-20 ENCOUNTER — Other Ambulatory Visit: Payer: Self-pay

## 2018-06-20 ENCOUNTER — Ambulatory Visit (INDEPENDENT_AMBULATORY_CARE_PROVIDER_SITE_OTHER): Payer: BLUE CROSS/BLUE SHIELD | Admitting: Obstetrics and Gynecology

## 2018-06-20 VITALS — BP 123/73 | HR 84 | Wt 139.0 lb

## 2018-06-20 DIAGNOSIS — O26892 Other specified pregnancy related conditions, second trimester: Secondary | ICD-10-CM

## 2018-06-20 DIAGNOSIS — R12 Heartburn: Secondary | ICD-10-CM

## 2018-06-20 DIAGNOSIS — Z3401 Encounter for supervision of normal first pregnancy, first trimester: Secondary | ICD-10-CM

## 2018-06-20 LAB — POCT URINALYSIS DIPSTICK OB
Bilirubin, UA: NEGATIVE
Blood, UA: NEGATIVE
Glucose, UA: NEGATIVE
Ketones, UA: NEGATIVE
Leukocytes, UA: NEGATIVE
Nitrite, UA: NEGATIVE
POC,PROTEIN,UA: NEGATIVE
Spec Grav, UA: >=1.03 — AB (ref 1.010–1.025)
Urobilinogen, UA: 0.2 E.U./dL
pH, UA: 5 (ref 5.0–8.0)

## 2018-06-20 NOTE — Progress Notes (Signed)
ROB- doing well except for heartburn daily, hasn't tried TUMs, discussed covid-19 & pregnancy, anatomy scan next visit.  Depression screen [facility] 2/9 06/20/2018 09/18/2017 09/07/2016  Decreased Interest 0 0 1  Down, Depressed, Hopeless 1 0 1  PHQ - 2 Score 1 0 2  Altered sleeping 0 0 0  Tired, decreased energy 0 0 3  Change in appetite 0 0 0  Feeling bad or failure about yourself  0 0 0  Trouble concentrating 0 0 0  Moving slowly or fidgety/restless 0 0 0  Suicidal thoughts 0 0 0  PHQ-9 Score 1 0 5  Difficult doing work/chores Not difficult at all - Somewhat difficult

## 2018-06-20 NOTE — Patient Instructions (Addendum)
FREQUENTLY ASKED QUESTIONS FOR OBSTETRICS/PEDIATRICS    Q: Why are visitor restrictions different for maternity care areas?  [place] is restricting visitors for the duration of the patient's hospitalization. The birth of a child involves the mother, considered the patient, and a birthing partner. These are unprecedented times and we are making the exception to allow a birthing partner to be a part of the patient unit. No other guests will be allowed in our [place] at [facility] and at [facility].   Q: Are credentialed doulas allowed to support their existing patients?  We acknowledge the value these doula partnerships offer our care teams and many birthing families in our communities. Each laboring mother is allowed one birthing partner of the patient's choosing for her entire hospitalization.   Q: Are visitor restrictions different for hospitalized children?  Pediatric patients (infants and children under 50 years of age), such as those in the Children's Unit, Pediatric ICU and NICU, will be allowed two visitors (parents or legal guardians)   Q: Are pregnant women at an increased risk for COVID-19?  The [employer] of Obstetricians and Gynecologists (ACOG) is monitoring closely the coronavirus pandemic. With the limited information available, data does not indicate pregnant women are at an increased risk. However, pregnant women are known to be at greater risk for respiratory infections like flu. With that in mind, expectant mothers are considered an at-risk population for COVID-19, according to ACOG.   Q: Are newborns at an increased risk for COVID-19?  A limited sample of COVID-19 data with newborns indicates the virus is not transferred to the infant during pregnancy. However, postpartum separation is recommended by the Centers for  Disease Control (CDC). As a result [place] recommends and strongly encourages temporary separation of moms and babies who test positive for COVID-19 or are awaiting results to rule out COVID-19 based on CDC guidelines.   Q: If you have a suspected case of COVID-19, is the NICU couplet care room an option?  No. If either patient is considered at-risk for having COVID-19, the [place] at [facility] will not use the NICU couplet care rooms for that family.   Q: [place] is urging that elective procedures be postponed. What is considered elective for women's and children's service line?  NOT ELECTIVE: Obstetric procedures, even those with an element of choice on timing, are not considered elective. Circumcisions are considered elective procedures, however, these do not deplete blood products and other resources, which is the spirit in which the COVID-19 postponement of elective procedures was intended. Therefore, circumcisions will be allowed.   ELECTIVE: Postpartum tubal ligations are considered elective and should be postponed. Q&A for Obstetricians, Gynecologists and Pediatricians  Published June 01, 2018   [facility] supports as much as possible the medical care  care team working with the patient's individual needs to address timing during these unprecedented times. We seek the support of our medical care team in preserving needed resources throughout our crisis response to COVID-19.   Q: How does COVID-19 impact breastfeeding?  Breastmilk is safe for your baby - even if the mother has tested positive for COVID-19. If a COVID-19+ mother decides to breastfeed while inpatient and after discharge, we suggest proper protective equipment be worn and hand hygiene be performed before and after feeding the infant. The new mother also has the option to pump her milk and have a healthy family member feed the baby to protect the baby from getting the virus.   Q: Should we urge  patients to avoid baby showers and large gatherings?  Yes. As has been recommended for all citizens in our communities, gatherings of 10 or more should be avoided - pregnant or not. Seek creative options for "hosting" baby showers through electronic means that honor the request for social distancing during this time of heightened awareness.   Q: Should patients miss their prenatal appointments?  No. Prenatal visits are NOT elective. While we want to limit contact and exposure, prenatal care is vital right now. Contact your physician's office if you have concerns about your visits. We are limiting outpatient office visits to the patient and one guest in order to reduce the potential for exposure.   Q: What if a pregnant woman feels sick? Should she miss her prenatal visit then?  A pregnant woman experiencing coronavirus-like symptoms (i.e., cough, fever, difficulty breathing, shortness of breath, gastrointestinal issues) should contact her pregnancy care provider by phone. Her medical professional can best determine whether she should use a video visit or possibly go to a collection site to be tested for COVID-19. Contacting her primary care provider or her pregnancy care provider is her first step.   Q: What can I do about childbirth education? All the classes are cancelled.  The [facility] [place] will offer online learning to support mothers on their journey. We currently offer Understanding Childbirth, Understanding Breastfeeding and Understanding Newborn Care as an online class. Please visit our website, [email or web address], to register for an online class.   Q: How can I keep from getting COVID-19? Q&A for Obstetricians, Gynecologists and Pediatricians  Published June 01, 2018   Together, we can reduce the risk of exposure to the virus and help you and your family remain healthy and safe. One of the best ways to protect yourself is to wash your hands frequently using soap and  water. Also, you should avoid touching your eyes, nose and mouth with unwashed hands, avoid physical contact with others and practice social distancing.   Q: How are employees being informed about what to do?  [facility] leaders receive a daily COVID-19 update and share relevant information with their teams. This is a time when health care professionals are called on to lead within our community. We appreciate our staff's engagement with our COVID-19 updates and encourage them to share best practices on reducing the spread of the virus with our patients and community. We are prepared to provide the exceptional COVID-19 care and coordination our community needs, expects and deserves.   Q: Who's in charge of this issue at [facility]?  The leadership structure and process established to address COVID-19 includes Chief Physician Executive Phoebe Sharps, MD; Infection Prevention Medical Director Carlyle Basques, MD; and Infection Prevention Interim Director Hubert Azure, MSN, RN, CIC, CSPDT. A  of [place] experts reflecting a broad spectrum of our workforce is meeting daily to evaluate new information we receive about COVID-19 and to adapt policies and practices accordingly.                         Published June 01, 2018    Heartburn During Pregnancy Heartburn is a type of pain or discomfort that can happen in the throat or chest. It is often described as a burning sensation. Heartburn is common during pregnancy because:  A hormone (progesterone) that is released during pregnancy may relax the valve (lower esophageal sphincter, or LES) that separates the esophagus from the stomach. This allows stomach acid to move up into the esophagus, causing heartburn.  The uterus gets larger and pushes up on the stomach, which pushes more acid into the esophagus. This is especially true in the later stages of pregnancy. Heartburn usually goes away or gets better after giving birth. What are the  causes? Heartburn is caused by stomach acid backing up into the esophagus (reflux). Reflux can be triggered by:  Changing hormone levels.  Large meals.  Certain foods and beverages, such as coffee, chocolate, onions, and peppermint.  Exercise.  Increased stomach acid production. What increases the risk? You are more likely to experience heartburn during pregnancy if you:  Had heartburn prior to becoming pregnant.  Have been pregnant more than once before.  Are overweight or obese. The likelihood that you will get heartburn also increases as you get farther along in your pregnancy, especially during the last trimester. What are the signs or symptoms? Symptoms of this condition include:  Burning pain in the chest or lower throat.  Bitter taste in the mouth.  Coughing.  Problems swallowing.  Vomiting.  Hoarse voice.  Asthma. Symptoms may get worse when you lie down or bend over. Symptoms are often worse at night. How is this diagnosed? This condition is diagnosed based on:  Your medical history.  Your symptoms.  Blood tests to check for a certain type of bacteria associated with heartburn.  Whether taking heartburn medicine relieves your symptoms.  Examination of the stomach and esophagus using a tube with a light and camera on the end (endoscopy). How is this treated? Treatment varies depending on how severe your symptoms are. Your health care provider may recommend:  Over-the-counter medicines (antacids or acid reducers) for mild heartburn.  Prescription medicines to decrease stomach acid or to protect your stomach lining.  Certain changes in your diet.  Raising the head of your bed so it is higher than the foot of the bed. This helps prevent stomach acid from backing up into the esophagus when you are lying down. Follow these instructions at home: Eating and drinking  Do not drink alcohol during your pregnancy.  Identify foods and beverages that make  your symptoms worse, and avoid them.  Beverages that you may want to avoid include: ? Coffee and tea (with or without caffeine). ? Energy drinks and sports drinks. ? Carbonated drinks or sodas. ? Citrus fruit juices.  Foods that you may want to avoid include: ? Chocolate and cocoa. ? Peppermint and mint flavorings. ? Garlic, onions, and horseradish. ? Spicy and acidic foods, including peppers, chili powder, curry powder, vinegar, hot sauces, and barbecue sauce. ? Citrus fruits, such as oranges, lemons, and limes. ? Tomato-based foods, such as red sauce, chili, and salsa. ? Fried and fatty foods, such as donuts, french fries, potato chips,  and high-fat dressings. ? High-fat meats, such as hot dogs, cold cuts, sausage, ham, and bacon. ? High-fat dairy items, such as whole milk, butter, and cheese.  Eat small, frequent meals instead of large meals.  Avoid drinking large amounts of liquid with your meals.  Avoid eating meals during the 2-3 hours before bedtime.  Avoid lying down right after you eat.  Do not exercise right after you eat. Medicines  Take over-the-counter and prescription medicines only as told by your health care provider.  Do not take aspirin, ibuprofen, or other NSAIDs unless your health care provider tells you to do that.  You may be instructed to avoid medicines that contain sodium bicarbonate. General instructions   If directed, raise the head of your bed about 6 inches (15 cm) by putting blocks under the legs. Sleeping with more pillows does not effectively relieve heartburn because it only changes the position of your head.  Do not use any products that contain nicotine or tobacco, such as cigarettes and e-cigarettes. If you need help quitting, ask your health care provider.  Wear loose-fitting clothing.  Try to reduce your stress, such as with yoga or meditation. If you need help managing stress, ask your health care provider.  Maintain a healthy  weight. If you are overweight, work with your health care provider to safely lose weight.  Keep all follow-up visits as told by your health care provider. This is important. Contact a health care provider if:  You develop new symptoms.  Your symptoms do not improve with treatment.  You have unexplained weight loss.  You have difficulty swallowing.  You make loud sounds when you breathe (wheeze).  You have a cough that does not go away.  You have frequent heartburn for more than 2 weeks.  You have nausea or vomiting that does not get better with treatment.  You have pain in your abdomen. Get help right away if:  You have severe chest pain that spreads to your arm, neck, or jaw.  You feel sweaty, dizzy, or light-headed.  You have shortness of breath.  You have pain when swallowing.  You vomit, and your vomit looks like blood or coffee grounds.  Your stool is bloody or black. This information is not intended to replace advice given to you by your health care provider. Make sure you discuss any questions you have with your health care provider. Document Released: 02/26/2000 Document Revised: 11/16/2015 Document Reviewed: 11/16/2015 Elsevier Interactive Patient Education  2019 [employer]

## 2018-07-10 ENCOUNTER — Telehealth: Payer: Self-pay

## 2018-07-10 NOTE — Telephone Encounter (Signed)
Coronavirus (COVID-19) Are you at risk?  Are you at risk for the Coronavirus (COVID-19)?  To be considered HIGH RISK for Coronavirus (COVID-19), you have to meet the following criteria:  . Traveled to [place], [place], [place], [place] or [place]; or in the [place] to [place], [place], [place], or [place]; and have fever, cough, and shortness of breath within the last 2 weeks of travel OR . Been in close contact with a person diagnosed with COVID-19 within the last 2 weeks and have fever, cough, and shortness of breath . IF YOU DO NOT MEET THESE CRITERIA, YOU ARE CONSIDERED LOW RISK FOR COVID-19.  What to do if you are HIGH RISK for COVID-19?  . If you are having a medical emergency, call 911. . Seek medical care right away. Before you go to a doctor's office, urgent care or emergency department, call ahead and tell them about your recent travel, contact with someone diagnosed with COVID-19, and your symptoms. You should receive instructions from your physician's office regarding next steps of care.  . When you arrive at healthcare provider, tell the healthcare staff immediately you have returned from visiting [place], [place], [place], [place] or [place]; or traveled in the [place] to [place], [place], [place], or [place]; in the last two weeks or you have been in close contact with a person diagnosed with COVID-19 in the last 2 weeks.   . Tell the health care staff about your symptoms: fever, cough and shortness of breath. . After you have been seen by a medical provider, you will be either: o Tested for (COVID-19) and discharged home on quarantine except to seek medical care if symptoms worsen, and asked to  - Stay home and avoid contact with others until you get your results (4-5 days)  - Avoid travel on public transportation if possible (such as bus, train, or airplane) or o Sent to the Emergency Department by EMS for evaluation, COVID-19 testing, and possible  admission depending on your condition and test results.  What to do if you are LOW RISK for COVID-19?  Reduce your risk of any infection by using the same precautions used for avoiding the common cold or flu:  . Wash your hands often with soap and warm water for at least 20 seconds.  If soap and water are not readily available, use an alcohol-based hand sanitizer with at least 60% alcohol.  . If coughing or sneezing, cover your mouth and nose by coughing or sneezing into the elbow areas of your shirt or coat, into a tissue or into your sleeve (not your hands). . Avoid shaking hands with others and consider head nods or verbal greetings only. . Avoid touching your eyes, nose, or mouth with unwashed hands.  . Avoid close contact with people who are sick. . Avoid places or events with large numbers of people in one location, like concerts or sporting events. . Carefully consider travel plans you have or are making. . If you are planning any travel outside or inside the [place], visit the CDC's Travelers' Health webpage for the latest health notices. . If you have some symptoms but not all symptoms, continue to monitor at home and seek medical attention if your symptoms worsen. . If you are having a medical emergency, call 911.   ADDITIONAL HEALTHCARE OPTIONS FOR PATIENTS  [place] [facility] / e-Visit: [email or web address]         [facility]: [phone]  [place]: [phone]                   [facility]: [phone]   Prescreened. Neg .cm 

## 2018-07-11 ENCOUNTER — Other Ambulatory Visit: Payer: Self-pay

## 2018-07-11 ENCOUNTER — Ambulatory Visit (INDEPENDENT_AMBULATORY_CARE_PROVIDER_SITE_OTHER): Payer: BLUE CROSS/BLUE SHIELD | Admitting: Certified Nurse Midwife

## 2018-07-11 ENCOUNTER — Ambulatory Visit (INDEPENDENT_AMBULATORY_CARE_PROVIDER_SITE_OTHER): Payer: BLUE CROSS/BLUE SHIELD

## 2018-07-11 DIAGNOSIS — Z363 Encounter for antenatal screening for malformations: Secondary | ICD-10-CM

## 2018-07-11 DIAGNOSIS — Z3401 Encounter for supervision of normal first pregnancy, first trimester: Secondary | ICD-10-CM

## 2018-07-11 NOTE — Progress Notes (Signed)
ROB doing well. Feels good movement. Anatomy u/s today . Results reviewed. See below. Reviewed covid restrictions in pregnancy. Reviewed glucose screening at next visit. Pt verbalizes and agrees to plan . Follow up 6 wks.   Doreene Burke, CNM   Patient Name: Jamie Dudley DOB: 1980-10-03 MRN: [id number] ULTRASOUND REPORT  Location: [facility] Date of Service: 07/11/2018   Indications:Anatomy Ultrasound Findings:  [place] Jim intrauterine pregnancy is visualized with FHR at 145 BPM. Biometrics give an (U/S) Gestational age of [redacted]w[redacted]d and an (U/S) EDD of 11/19/2018; this correlates with the clinically established Estimated Date of Delivery: 11/17/18  Fetal presentation is Breech.  EFW: 406 g ( 14 oz). Placenta: posterior. Grade: 1 AFI: subjectively normal.  Anatomic survey is complete and normal; Gender - female.    Right Ovary is normal in appearance. Left Ovary is normal appearance. Survey of the adnexa demonstrates no adnexal masses. There is no free peritoneal fluid in the cul de sac.  Impression: 1. [redacted]w[redacted]d Viable Singleton Intrauterine pregnancy by U/S. 2. (U/S) EDD is consistent with Clinically established Estimated Date of Delivery: 11/17/18 . 3. Normal Anatomy Scan  Recommendations: 1.Clinical correlation with the patient's History and Physical Exam.   Jenine M. Marciano Sequin    RDMS

## 2018-07-11 NOTE — Patient Instructions (Signed)
Glucose Tolerance Test During Pregnancy Why am I having this test? The glucose tolerance test (GTT) is done to check how your body processes sugar (glucose). This is one of several tests used to diagnose diabetes that develops during pregnancy (gestational diabetes mellitus). Gestational diabetes is a temporary form of diabetes that some women develop during pregnancy. It usually occurs during the second trimester of pregnancy and goes away after delivery. Testing (screening) for gestational diabetes usually occurs between 24 and 28 weeks of pregnancy. You may have the GTT test after having a 1-hour glucose screening test if the results from that test indicate that you may have gestational diabetes. You may also have this test if:  You have a history of gestational diabetes.  You have a history of giving birth to very large babies or have experienced repeated fetal loss (stillbirth).  You have signs and symptoms of diabetes, such as: ? Changes in your vision. ? Tingling or numbness in your hands or feet. ? Changes in hunger, thirst, and urination that are not otherwise explained by your pregnancy. What is being tested? This test measures the amount of glucose in your blood at different times during a period of 3 hours. This indicates how well your body is able to process glucose. What kind of sample is taken?  Blood samples are required for this test. They are usually collected by inserting a needle into a blood vessel. How do I prepare for this test?  For 3 days before your test, eat normally. Have plenty of carbohydrate-rich foods.  Follow instructions from your health care provider about: ? Eating or drinking restrictions on the day of the test. You may be asked to not eat or drink anything other than water (fast) starting 8-10 hours before the test. ? Changing or stopping your regular medicines. Some medicines may interfere with this test. Tell a health care provider about:  All  medicines you are taking, including vitamins, herbs, eye drops, creams, and over-the-counter medicines.  Any blood disorders you have.  Any surgeries you have had.  Any medical conditions you have. What happens during the test? First, your blood glucose will be measured. This is referred to as your fasting blood glucose, since you fasted before the test. Then, you will drink a glucose solution that contains a certain amount of glucose. Your blood glucose will be measured again 1, 2, and 3 hours after drinking the solution. This test takes about 3 hours to complete. You will need to stay at the testing location during this time. During the testing period:  Do not eat or drink anything other than the glucose solution.  Do not exercise.  Do not use any products that contain nicotine or tobacco, such as cigarettes and e-cigarettes. If you need help stopping, ask your health care provider. The testing procedure may vary among health care providers and hospitals. How are the results reported? Your results will be reported as milligrams of glucose per deciliter of blood (mg/dL) or millimoles per liter (mmol/L). Your health care provider will compare your results to normal ranges that were established after testing a large group of people (reference ranges). Reference ranges may vary among labs and hospitals. For this test, common reference ranges are:  Fasting: less than 95-105 mg/dL (5.3-5.8 mmol/L).  1 hour after drinking glucose: less than 180-190 mg/dL (10.0-10.5 mmol/L).  2 hours after drinking glucose: less than 155-165 mg/dL (8.6-9.2 mmol/L).  3 hours after drinking glucose: 140-145 mg/dL (7.8-8.1 mmol/L). What do the   results mean? Results within reference ranges are considered normal, meaning that your glucose levels are well-controlled. If two or more of your blood glucose levels are high, you may be diagnosed with gestational diabetes. If only one level is high, your health care  provider may suggest repeat testing or other tests to confirm a diagnosis. Talk with your health care provider about what your results mean. Questions to ask your health care provider Ask your health care provider, or the department that is doing the test:  When will my results be ready?  How will I get my results?  What are my treatment options?  What other tests do I need?  What are my next steps? Summary  The glucose tolerance test (GTT) is one of several tests used to diagnose diabetes that develops during pregnancy (gestational diabetes mellitus). Gestational diabetes is a temporary form of diabetes that some women develop during pregnancy.  You may have the GTT test after having a 1-hour glucose screening test if the results from that test indicate that you may have gestational diabetes. You may also have this test if you have any symptoms or risk factors for gestational diabetes.  Talk with your health care provider about what your results mean. This information is not intended to replace advice given to you by your health care provider. Make sure you discuss any questions you have with your health care provider. Document Released: 08/30/2011 Document Revised: 10/10/2016 Document Reviewed: 10/10/2016 Elsevier Interactive Patient Education  2019 [employer]  

## 2018-08-23 ENCOUNTER — Other Ambulatory Visit: Payer: Self-pay

## 2018-08-23 ENCOUNTER — Other Ambulatory Visit: Payer: BLUE CROSS/BLUE SHIELD

## 2018-08-23 ENCOUNTER — Ambulatory Visit (INDEPENDENT_AMBULATORY_CARE_PROVIDER_SITE_OTHER): Payer: BLUE CROSS/BLUE SHIELD | Admitting: Obstetrics and Gynecology

## 2018-08-23 VITALS — BP 132/83 | HR 91 | Wt 151.0 lb

## 2018-08-23 DIAGNOSIS — Z3401 Encounter for supervision of normal first pregnancy, first trimester: Secondary | ICD-10-CM

## 2018-08-23 DIAGNOSIS — Z3492 Encounter for supervision of normal pregnancy, unspecified, second trimester: Secondary | ICD-10-CM

## 2018-08-23 DIAGNOSIS — Z23 Encounter for immunization: Secondary | ICD-10-CM | POA: Diagnosis not present

## 2018-08-23 DIAGNOSIS — Z3402 Encounter for supervision of normal first pregnancy, second trimester: Secondary | ICD-10-CM

## 2018-08-23 DIAGNOSIS — Z130 Encounter for screening for diseases of the blood and blood-forming organs and certain disorders involving the immune mechanism: Secondary | ICD-10-CM

## 2018-08-23 LAB — POCT URINALYSIS DIPSTICK OB
Bilirubin, UA: NEGATIVE
Blood, UA: NEGATIVE
Glucose, UA: NEGATIVE
Ketones, UA: NEGATIVE
Leukocytes, UA: NEGATIVE
Nitrite, UA: NEGATIVE
POC,PROTEIN,UA: NEGATIVE
Spec Grav, UA: 1.02 (ref 1.010–1.025)
Urobilinogen, UA: 0.2 E.U./dL
pH, UA: 6 (ref 5.0–8.0)

## 2018-08-23 MED ORDER — TETANUS-DIPHTH-ACELL PERTUSSIS 5-2.5-18.5 LF-MCG/0.5 IM SUSP
0.5000 mL | Freq: Once | INTRAMUSCULAR | Status: AC
  Administered 2018-08-23: 0.5 mL via INTRAMUSCULAR

## 2018-08-23 NOTE — Patient Instructions (Signed)
Third Trimester of Pregnancy The third trimester is from week 28 through week 40 (months 7 through 9). The third trimester is a time when the unborn baby (fetus) is growing rapidly. At the end of the ninth month, the fetus is about 20 inches in length and weighs 6-10 pounds. Body changes during your third trimester Your body will continue to go through many changes during pregnancy. The changes vary from woman to woman. During the third trimester:  Your weight will continue to increase. You can expect to gain 25-35 pounds (11-16 kg) by the end of the pregnancy.  You may begin to get stretch marks on your hips, abdomen, and breasts.  You may urinate more often because the fetus is moving lower into your pelvis and pressing on your bladder.  You may develop or continue to have heartburn. This is caused by increased hormones that slow down muscles in the digestive tract.  You may develop or continue to have constipation because increased hormones slow digestion and cause the muscles that push waste through your intestines to relax.  You may develop hemorrhoids. These are swollen veins (varicose veins) in the rectum that can itch or be painful.  You may develop swollen, bulging veins (varicose veins) in your legs.  You may have increased body aches in the pelvis, back, or thighs. This is due to weight gain and increased hormones that are relaxing your joints.  You may have changes in your hair. These can include thickening of your hair, rapid growth, and changes in texture. Some women also have hair loss during or after pregnancy, or hair that feels dry or thin. Your hair will most likely return to normal after your baby is born.  Your breasts will continue to grow and they will continue to become tender. A yellow fluid (colostrum) may leak from your breasts. This is the first milk you are producing for your baby.  Your belly button may stick out.  You may notice more swelling in your hands,  face, or ankles.  You may have increased tingling or numbness in your hands, arms, and legs. The skin on your belly may also feel numb.  You may feel short of breath because of your expanding uterus.  You may have more problems sleeping. This can be caused by the size of your belly, increased need to urinate, and an increase in your body's metabolism.  You may notice the fetus "dropping," or moving lower in your abdomen (lightening).  You may have increased vaginal discharge.  You may notice your joints feel loose and you may have pain around your pelvic bone. What to expect at prenatal visits You will have prenatal exams every 2 weeks until week 36. Then you will have weekly prenatal exams. During a routine prenatal visit:  You will be weighed to make sure you and the baby are growing normally.  Your blood pressure will be taken.  Your abdomen will be measured to track your baby's growth.  The fetal heartbeat will be listened to.  Any test results from the previous visit will be discussed.  You may have a cervical check near your due date to see if your cervix has softened or thinned (effaced).  You will be tested for Group B streptococcus. This happens between 35 and 37 weeks. Your health care provider may ask you:  What your birth plan is.  How you are feeling.  If you are feeling the baby move.  If you have had any abnormal   symptoms, such as leaking fluid, bleeding, severe headaches, or abdominal cramping.  If you are using any tobacco products, including cigarettes, chewing tobacco, and electronic cigarettes.  If you have any questions. Other tests or screenings that may be performed during your third trimester include:  Blood tests that check for low iron levels (anemia).  Fetal testing to check the health, activity level, and growth of the fetus. Testing is done if you have certain medical conditions or if there are problems during the pregnancy.  Nonstress test  (NST). This test checks the health of your baby to make sure there are no signs of problems, such as the baby not getting enough oxygen. During this test, a belt is placed around your belly. The baby is made to move, and its heart rate is monitored during movement. What is false labor? False labor is a condition in which you feel small, irregular tightenings of the muscles in the womb (contractions) that usually go away with rest, changing position, or drinking water. These are called Braxton Hicks contractions. Contractions may last for hours, days, or even weeks before true labor sets in. If contractions come at regular intervals, become more frequent, increase in intensity, or become painful, you should see your health care provider. What are the signs of labor?  Abdominal cramps.  Regular contractions that start at 10 minutes apart and become stronger and more frequent with time.  Contractions that start on the top of the uterus and spread down to the lower abdomen and back.  Increased pelvic pressure and dull back pain.  A watery or bloody mucus discharge that comes from the vagina.  Leaking of amniotic fluid. This is also known as your "water breaking." It could be a slow trickle or a gush. Let your health care provider know if it has a color or strange odor. If you have any of these signs, call your health care provider right away, even if it is before your due date. Follow these instructions at home: Medicines  Follow your health care provider's instructions regarding medicine use. Specific medicines may be either safe or unsafe to take during pregnancy.  Take a prenatal vitamin that contains at least 600 micrograms (mcg) of folic acid.  If you develop constipation, try taking a stool softener if your health care provider approves. Eating and drinking   Eat a balanced diet that includes fresh fruits and vegetables, whole grains, good sources of protein such as meat, eggs, or tofu,  and low-fat dairy. Your health care provider will help you determine the amount of weight gain that is right for you.  Avoid raw meat and uncooked cheese. These carry germs that can cause birth defects in the baby.  If you have low calcium intake from food, talk to your health care provider about whether you should take a daily calcium supplement.  Eat four or five small meals rather than three large meals a day.  Limit foods that are high in fat and processed sugars, such as fried and sweet foods.  To prevent constipation: ? Drink enough fluid to keep your urine clear or pale yellow. ? Eat foods that are high in fiber, such as fresh fruits and vegetables, whole grains, and beans. Activity  Exercise only as directed by your health care provider. Most women can continue their usual exercise routine during pregnancy. Try to exercise for 30 minutes at least 5 days a week. Stop exercising if you experience uterine contractions.  Avoid heavy lifting.  Do   not exercise in extreme heat or humidity, or at high altitudes.  Wear low-heel, comfortable shoes.  Practice good posture.  You may continue to have sex unless your health care provider tells you otherwise. Relieving pain and discomfort  Take frequent breaks and rest with your legs elevated if you have leg cramps or low back pain.  Take warm sitz baths to soothe any pain or discomfort caused by hemorrhoids. Use hemorrhoid cream if your health care provider approves.  Wear a good support bra to prevent discomfort from breast tenderness.  If you develop varicose veins: ? Wear support pantyhose or compression stockings as told by your healthcare provider. ? Elevate your feet for 15 minutes, 3-4 times a day. Prenatal care  Write down your questions. Take them to your prenatal visits.  Keep all your prenatal visits as told by your health care provider. This is important. Safety  Wear your seat belt at all times when driving.  Make  a list of emergency phone numbers, including numbers for family, friends, the hospital, and police and fire departments. General instructions  Avoid cat litter boxes and soil used by cats. These carry germs that can cause birth defects in the baby. If you have a cat, ask someone to clean the litter box for you.  Do not travel far distances unless it is absolutely necessary and only with the approval of your health care provider.  Do not use hot tubs, steam rooms, or saunas.  Do not drink alcohol.  Do not use any products that contain nicotine or tobacco, such as cigarettes and e-cigarettes. If you need help quitting, ask your health care provider.  Do not use any medicinal herbs or unprescribed drugs. These chemicals affect the formation and growth of the baby.  Do not douche or use tampons or scented sanitary pads.  Do not cross your legs for long periods of time.  To prepare for the arrival of your baby: ? Take prenatal classes to understand, practice, and ask questions about labor and delivery. ? Make a trial run to the hospital. ? Visit the hospital and tour the maternity area. ? Arrange for maternity or paternity leave through employers. ? Arrange for family and friends to take care of pets while you are in the hospital. ? Purchase a rear-facing car seat and make sure you know how to install it in your car. ? Pack your hospital bag. ? Prepare the baby's nursery. Make sure to remove all pillows and stuffed animals from the baby's crib to prevent suffocation.  Visit your dentist if you have not gone during your pregnancy. Use a soft toothbrush to brush your teeth and be gentle when you floss. Contact a health care provider if:  You are unsure if you are in labor or if your water has broken.  You become dizzy.  You have mild pelvic cramps, pelvic pressure, or nagging pain in your abdominal area.  You have lower back pain.  You have persistent nausea, vomiting, or  diarrhea.  You have an unusual or bad smelling vaginal discharge.  You have pain when you urinate. Get help right away if:  Your water breaks before 37 weeks.  You have regular contractions less than 5 minutes apart before 37 weeks.  You have a fever.  You are leaking fluid from your vagina.  You have spotting or bleeding from your vagina.  You have severe abdominal pain or cramping.  You have rapid weight loss or weight gain.  You have   shortness of breath with chest pain.  You notice sudden or extreme swelling of your face, hands, ankles, feet, or legs.  Your baby makes fewer than 10 movements in 2 hours.  You have severe headaches that do not go away when you take medicine.  You have vision changes. Summary  The third trimester is from week 28 through week 40, months 7 through 9. The third trimester is a time when the unborn baby (fetus) is growing rapidly.  During the third trimester, your discomfort may increase as you and your baby continue to gain weight. You may have abdominal, leg, and back pain, sleeping problems, and an increased need to urinate.  During the third trimester your breasts will keep growing and they will continue to become tender. A yellow fluid (colostrum) may leak from your breasts. This is the first milk you are producing for your baby.  False labor is a condition in which you feel small, irregular tightenings of the muscles in the womb (contractions) that eventually go away. These are called Braxton Hicks contractions. Contractions may last for hours, days, or even weeks before true labor sets in.  Signs of labor can include: abdominal cramps; regular contractions that start at 10 minutes apart and become stronger and more frequent with time; watery or bloody mucus discharge that comes from the vagina; increased pelvic pressure and dull back pain; and leaking of amniotic fluid. This information is not intended to replace advice given to you by your  health care provider. Make sure you discuss any questions you have with your health care provider. Document Released: 02/22/2001 Document Revised: 04/05/2016 Document Reviewed: 04/05/2016 Elsevier Interactive Patient Education  2019 [employer]  

## 2018-08-23 NOTE — Progress Notes (Signed)
ROB & glucola- doing well, Tdap given, having some low back pain, has already started classes.unsure about circumcision. Will consider growth scan if FH not increased at next visit, feel like today is due to transverse lie.

## 2018-08-23 NOTE — Progress Notes (Signed)
ROB- glucola done today, blood consent, tdap given, pt is doing well

## 2018-08-24 LAB — CBC
Hematocrit: 33.8 % — ABNORMAL LOW (ref 34.0–46.6)
Hemoglobin: 11.5 g/dL (ref 11.1–15.9)
MCH: 31.1 pg (ref 26.6–33.0)
MCHC: 34 g/dL (ref 31.5–35.7)
MCV: 91 fL (ref 79–97)
Platelets: 205 10*3/uL (ref 150–450)
RBC: 3.7 x10E6/uL — ABNORMAL LOW (ref 3.77–5.28)
RDW: 12.6 % (ref 11.7–15.4)
WBC: 8.4 10*3/uL (ref 3.4–10.8)

## 2018-08-24 LAB — GLUCOSE TOLERANCE, 1 HOUR: Glucose, 1Hr PP: 75 mg/dL (ref 65–199)

## 2018-08-24 LAB — RPR: RPR Ser Ql: NONREACTIVE

## 2018-09-03 ENCOUNTER — Encounter: Payer: BLUE CROSS/BLUE SHIELD | Admitting: Family Medicine

## 2018-09-03 ENCOUNTER — Ambulatory Visit (INDEPENDENT_AMBULATORY_CARE_PROVIDER_SITE_OTHER): Payer: BLUE CROSS/BLUE SHIELD | Admitting: Family Medicine

## 2018-09-03 ENCOUNTER — Encounter: Payer: Self-pay | Admitting: Family Medicine

## 2018-09-03 ENCOUNTER — Other Ambulatory Visit: Payer: Self-pay

## 2018-09-03 VITALS — BP 130/80 | HR 100 | Ht 65.0 in | Wt 152.0 lb

## 2018-09-03 DIAGNOSIS — H6122 Impacted cerumen, left ear: Secondary | ICD-10-CM

## 2018-09-03 NOTE — Progress Notes (Signed)
Date:  09/03/2018   Name:  Jamie Dudley   DOB:  04/13/1980   MRN:  [id number]   Chief Complaint: Ear Fullness (L) ear feels "stopped up" for a couple of days)  Ear Fullness  There is pain in the left ear. This is a new problem. The current episode started in the past 7 days (2 days). The problem has been waxing and waning. There has been no fever. The pain is mild. Pertinent negatives include no abdominal pain, coughing, diarrhea, ear discharge, headaches, hearing loss, neck pain, rash, rhinorrhea, sore throat or vomiting. She has tried nothing for the symptoms. The treatment provided no relief.    Review of Systems  Constitutional: Negative.  Negative for chills, fatigue, fever and unexpected weight change.  HENT: Negative for congestion, ear discharge, ear pain, hearing loss, rhinorrhea, sinus pressure, sneezing and sore throat.   Eyes: Negative for photophobia, pain, discharge, redness and itching.  Respiratory: Negative for cough, shortness of breath, wheezing and stridor.   Gastrointestinal: Negative for abdominal pain, blood in stool, constipation, diarrhea, nausea and vomiting.  Endocrine: Negative for cold intolerance, heat intolerance, polydipsia, polyphagia and polyuria.  Genitourinary: Negative for dysuria, flank pain, frequency, hematuria, menstrual problem, pelvic pain, urgency, vaginal bleeding and vaginal discharge.  Musculoskeletal: Negative for arthralgias, back pain, myalgias and neck pain.  Skin: Negative for rash.  Allergic/Immunologic: Negative for environmental allergies and food allergies.  Neurological: Negative for dizziness, weakness, light-headedness, numbness and headaches.  Hematological: Negative for adenopathy. Does not bruise/bleed easily.  Psychiatric/Behavioral: Negative for dysphoric mood. The patient is not nervous/anxious.     Patient Active Problem List   Diagnosis Date Noted  . MDD (major depressive disorder), recurrent episode, moderate (HCC)  09/18/2017    No Known Allergies  Past Surgical History:  Procedure Laterality Date  . COLPOSCOPY    . WISDOM TOOTH EXTRACTION      Social History   Tobacco Use  . Smoking status: Never Smoker  . Smokeless tobacco: Never Used  Substance Use Topics  . Alcohol use: Not Currently    Comment: rare  . Drug use: No     Medication list has been reviewed and updated.  Current Meds  Medication Sig  . Prenatal Vit-Fe Fumarate-FA (PRENATAL MULTIVITAMIN) TABS tablet Take 1 tablet by mouth daily at 12 noon.    PHQ 2/9 Scores 09/03/2018 06/20/2018 09/18/2017 09/07/2016  PHQ - 2 Score 0 1 0 2  PHQ- 9 Score 0 1 0 5    BP Readings from Last 3 Encounters:  09/03/18 130/80  08/23/18 132/83  07/11/18 129/68    Physical Exam Vitals signs and nursing note reviewed.  Constitutional:      Appearance: She is well-developed.  HENT:     Head: Normocephalic.     Jaw: There is normal jaw occlusion.     Right Ear: Hearing, ear canal and external ear normal. Tympanic membrane is retracted.     Left Ear: Hearing and external ear normal. There is impacted cerumen.     Nose: Nose normal. No congestion or rhinorrhea.  Eyes:     General: Lids are everted, no foreign bodies appreciated. No scleral icterus.       Left eye: No foreign body or hordeolum.     Conjunctiva/sclera: Conjunctivae normal.     Right eye: Right conjunctiva is not injected.     Left eye: Left conjunctiva is not injected.     Pupils: Pupils are equal, round, and reactive to light.  Neck:     Musculoskeletal: Full passive range of motion without pain, normal range of motion and neck supple.     Thyroid: No thyromegaly.     Vascular: No JVD.     Trachea: No tracheal deviation.  Cardiovascular:     Rate and Rhythm: Normal rate and regular rhythm.     Heart sounds: Normal heart sounds. No murmur. No friction rub. No gallop.   Pulmonary:     Effort: Pulmonary effort is normal. No respiratory distress.     Breath sounds: Normal  breath sounds. No wheezing or rales.  Abdominal:     General: Bowel sounds are normal.     Palpations: Abdomen is soft. There is no mass.     Tenderness: There is no abdominal tenderness. There is no guarding or rebound.  Musculoskeletal: Normal range of motion.        General: No tenderness.  Lymphadenopathy:     Cervical: No cervical adenopathy.     Right cervical: No superficial, deep or posterior cervical adenopathy.    Left cervical: No superficial, deep or posterior cervical adenopathy.  Skin:    General: Skin is warm.     Findings: No rash.  Neurological:     Mental Status: She is alert and oriented to person, place, and time.     Cranial Nerves: No cranial nerve deficit.     Deep Tendon Reflexes: Reflexes normal.  Psychiatric:        Mood and Affect: Mood is not anxious or depressed.     Wt Readings from Last 3 Encounters:  09/03/18 152 lb (68.9 kg)  08/23/18 151 lb (68.5 kg)  07/11/18 144 lb 5 oz (65.5 kg)    BP 130/80   Pulse 100   Ht 5\' 5"  (1.651 m)   Wt 152 lb (68.9 kg)   LMP 02/15/2018 (Exact Date)   BMI 25.29 kg/m   Assessment and Plan:  1. Impacted cerumen of left ear Patient with decreased hearing and fullness in the left ear.  Patient was noted to have impacted cerumen.  This area was irrigated and was removed.  Other than some mild retraction of tympanic membranes there was no evidence of any concern or infection.

## 2018-09-06 ENCOUNTER — Encounter: Payer: BLUE CROSS/BLUE SHIELD | Admitting: Certified Nurse Midwife

## 2018-09-07 ENCOUNTER — Ambulatory Visit (INDEPENDENT_AMBULATORY_CARE_PROVIDER_SITE_OTHER): Payer: BLUE CROSS/BLUE SHIELD | Admitting: Certified Nurse Midwife

## 2018-09-07 ENCOUNTER — Other Ambulatory Visit: Payer: Self-pay

## 2018-09-07 VITALS — BP 131/89 | HR 80 | Wt 152.2 lb

## 2018-09-07 DIAGNOSIS — Z3493 Encounter for supervision of normal pregnancy, unspecified, third trimester: Secondary | ICD-10-CM

## 2018-09-07 LAB — POCT URINALYSIS DIPSTICK OB
Bilirubin, UA: NEGATIVE
Blood, UA: NEGATIVE
Glucose, UA: NEGATIVE
Ketones, UA: NEGATIVE
Leukocytes, UA: NEGATIVE
Nitrite, UA: NEGATIVE
POC,PROTEIN,UA: NEGATIVE
Spec Grav, UA: 1.025 (ref 1.010–1.025)
Urobilinogen, UA: 0.2 E.U./dL
pH, UA: 6 (ref 5.0–8.0)

## 2018-09-07 NOTE — Progress Notes (Signed)
ROB-Doing well. Discussed abdominal support options. 28 wk labs within normal limits. Anticipatory guidance regarding course of prenatal care. Reviewed red flag symptoms and when to call. RTC x 2 weeks for ROB or sooner if needed.

## 2018-09-07 NOTE — Patient Instructions (Signed)

## 2018-09-07 NOTE — Progress Notes (Signed)
ROB-No complaints, has questions regarding maternity belt.

## 2018-09-18 ENCOUNTER — Telehealth: Payer: Self-pay

## 2018-09-18 NOTE — Telephone Encounter (Signed)
Coronavirus (COVID-19) Are you at risk?  Are you at risk for the Coronavirus (COVID-19)?  To be considered HIGH RISK for Coronavirus (COVID-19), you have to meet the following criteria:  . Traveled to [place], [place], [place], [place] or [place]; or in the [place] to [place], [place], [place], or [place]; and have fever, cough, and shortness of breath within the last 2 weeks of travel OR . Been in close contact with a person diagnosed with COVID-19 within the last 2 weeks and have fever, cough, and shortness of breath . IF YOU DO NOT MEET THESE CRITERIA, YOU ARE CONSIDERED LOW RISK FOR COVID-19.  What to do if you are HIGH RISK for COVID-19?  . If you are having a medical emergency, call 911. . Seek medical care right away. Before you go to a doctor's office, urgent care or emergency department, call ahead and tell them about your recent travel, contact with someone diagnosed with COVID-19, and your symptoms. You should receive instructions from your physician's office regarding next steps of care.  . When you arrive at healthcare provider, tell the healthcare staff immediately you have returned from visiting [place], [place], [place], [place] or [place]; or traveled in the [place] to [place], [place], [place], or [place]; in the last two weeks or you have been in close contact with a person diagnosed with COVID-19 in the last 2 weeks.   . Tell the health care staff about your symptoms: fever, cough and shortness of breath. . After you have been seen by a medical provider, you will be either: o Tested for (COVID-19) and discharged home on quarantine except to seek medical care if symptoms worsen, and asked to  - Stay home and avoid contact with others until you get your results (4-5 days)  - Avoid travel on public transportation if possible (such as bus, train, or airplane) or o Sent to the Emergency Department by EMS for evaluation, COVID-19 testing, and possible  admission depending on your condition and test results.  What to do if you are LOW RISK for COVID-19?  Reduce your risk of any infection by using the same precautions used for avoiding the common cold or flu:  . Wash your hands often with soap and warm water for at least 20 seconds.  If soap and water are not readily available, use an alcohol-based hand sanitizer with at least 60% alcohol.  . If coughing or sneezing, cover your mouth and nose by coughing or sneezing into the elbow areas of your shirt or coat, into a tissue or into your sleeve (not your hands). . Avoid shaking hands with others and consider head nods or verbal greetings only. . Avoid touching your eyes, nose, or mouth with unwashed hands.  . Avoid close contact with people who are [place]. . Avoid places or events with large numbers of people in one location, like concerts or sporting events. . Carefully consider travel plans you have or are making. . If you are planning any travel outside or inside the [place], visit the CDC's Travelers' Health webpage for the latest health notices. . If you have some symptoms but not all symptoms, continue to monitor at home and seek medical attention if your symptoms worsen. . If you are having a medical emergency, call 911.  09/18/18 SCREENING NEG SLS ADDITIONAL HEALTHCARE OPTIONS FOR PATIENTS  [facility] / e-Visit: [email or web address]         [facility]: [phone]    [facility]: [phone]                   [facility]: [phone]  

## 2018-09-19 ENCOUNTER — Other Ambulatory Visit: Payer: Self-pay

## 2018-09-19 ENCOUNTER — Encounter: Payer: Self-pay | Admitting: Certified Nurse Midwife

## 2018-09-19 ENCOUNTER — Ambulatory Visit (INDEPENDENT_AMBULATORY_CARE_PROVIDER_SITE_OTHER): Payer: BLUE CROSS/BLUE SHIELD | Admitting: Certified Nurse Midwife

## 2018-09-19 VITALS — BP 139/85 | HR 92 | Wt 153.4 lb

## 2018-09-19 DIAGNOSIS — Z3403 Encounter for supervision of normal first pregnancy, third trimester: Secondary | ICD-10-CM

## 2018-09-19 LAB — POCT URINALYSIS DIPSTICK OB
Bilirubin, UA: NEGATIVE
Blood, UA: NEGATIVE
Glucose, UA: NEGATIVE
Ketones, UA: NEGATIVE
Leukocytes, UA: NEGATIVE
Nitrite, UA: NEGATIVE
POC,PROTEIN,UA: NEGATIVE
Spec Grav, UA: 1.025 (ref 1.010–1.025)
Urobilinogen, UA: 0.2 E.U./dL
pH, UA: 7 (ref 5.0–8.0)

## 2018-09-19 NOTE — Progress Notes (Signed)
ROB , doing well. Feels good movement. Pt concerned about acne on breast and breast feeding. Reassurance given. Discussed hygiene and to not pop white heads. She verbalizes and agrees to plan of care. Follow up 2 wk with Sharyn Lull.   Philip Aspen, CNM

## 2018-09-19 NOTE — Patient Instructions (Signed)
[facility]  [facility]  [address], [place], [place] [address]  Phone: [phone]  [facility] (second location)  [address] [place], [place] [address]  Phone: [phone]  [facility] ([place]) [address], [place], [place] [address] Phone: [phone]  [facility]  [address] [place], [place] [address]  Phone: [phone] 

## 2018-09-27 DIAGNOSIS — Z0289 Encounter for other administrative examinations: Secondary | ICD-10-CM

## 2018-10-04 ENCOUNTER — Ambulatory Visit (INDEPENDENT_AMBULATORY_CARE_PROVIDER_SITE_OTHER): Payer: BLUE CROSS/BLUE SHIELD | Admitting: Certified Nurse Midwife

## 2018-10-04 ENCOUNTER — Other Ambulatory Visit: Payer: Self-pay

## 2018-10-04 VITALS — BP 122/79 | HR 75 | Wt 156.8 lb

## 2018-10-04 DIAGNOSIS — Z3493 Encounter for supervision of normal pregnancy, unspecified, third trimester: Secondary | ICD-10-CM

## 2018-10-04 LAB — POCT URINALYSIS DIPSTICK OB
Bilirubin, UA: NEGATIVE
Blood, UA: NEGATIVE
Glucose, UA: NEGATIVE
Ketones, UA: NEGATIVE
Nitrite, UA: NEGATIVE
Spec Grav, UA: >=1.03 — AB (ref 1.010–1.025)
Urobilinogen, UA: 0.2 E.U./dL
pH, UA: 6 (ref 5.0–8.0)

## 2018-10-04 NOTE — Patient Instructions (Signed)
Fetal Movement Counts Patient Name: ________________________________________________ Patient Due Date: ____________________ What is a fetal movement count?  A fetal movement count is the number of times that you feel your baby move during a certain amount of time. This may also be called a fetal kick count. A fetal movement count is recommended for every pregnant woman. You may be asked to start counting fetal movements as early as week 28 of your pregnancy. Pay attention to when your baby is most active. You may notice your baby's sleep and wake cycles. You may also notice things that make your baby move more. You should do a fetal movement count:  When your baby is normally most active.  At the same time each day. A good time to count movements is while you are resting, after having something to eat and drink. How do I count fetal movements? 1. Find a quiet, comfortable area. Sit, or lie down on your side. 2. Write down the date, the start time and stop time, and the number of movements that you felt between those two times. Take this information with you to your health care visits. 3. For 2 hours, count kicks, flutters, swishes, rolls, and jabs. You should feel at least 10 movements during 2 hours. 4. You may stop counting after you have felt 10 movements. 5. If you do not feel 10 movements in 2 hours, have something to eat and drink. Then, keep resting and counting for 1 hour. If you feel at least 4 movements during that hour, you may stop counting. Contact a health care provider if:  You feel fewer than 4 movements in 2 hours.  Your baby is not moving like he or she usually does. Date: ____________ Start time: ____________ Stop time: ____________ Movements: ____________ Date: ____________ Start time: ____________ Stop time: ____________ Movements: ____________ Date: ____________ Start time: ____________ Stop time: ____________ Movements: ____________ Date: ____________ Start time:  ____________ Stop time: ____________ Movements: ____________ Date: ____________ Start time: ____________ Stop time: ____________ Movements: ____________ Date: ____________ Start time: ____________ Stop time: ____________ Movements: ____________ Date: ____________ Start time: ____________ Stop time: ____________ Movements: ____________ Date: ____________ Start time: ____________ Stop time: ____________ Movements: ____________ Date: ____________ Start time: ____________ Stop time: ____________ Movements: ____________ This information is not intended to replace advice given to you by your health care provider. Make sure you discuss any questions you have with your health care provider. Document Released: 03/30/2006 Document Revised: 03/20/2018 Document Reviewed: 04/09/2015 Elsevier Patient Education  2020 Elsevier Inc.  [facility]   [facility]  [address], [place], [place] [address]  Phone: [phone]   [facility] (second location)  [address] [place], [place] [address]  Phone: [phone]   [facility]) [address], [place], [place] [address] Phone: [phone]   [facility]  [address] [place], [place] [address]  Phone: [phone] 

## 2018-10-04 NOTE — Progress Notes (Signed)
ROB-Patient c/o occassional abdominal discomfort with fetal movement.

## 2018-10-04 NOTE — Progress Notes (Signed)
ROB-Reports pain with fetal movement. Discussed home treatment measures. Herbal prep, [facility], Illustrated Birth Choices, and Pre-labor checklist handouts. Reviewed red flag symptoms and when to call. RTC x 2 weeks for ROB or sooner if needed.

## 2018-10-19 ENCOUNTER — Other Ambulatory Visit: Payer: Self-pay

## 2018-10-19 ENCOUNTER — Ambulatory Visit (INDEPENDENT_AMBULATORY_CARE_PROVIDER_SITE_OTHER): Payer: BLUE CROSS/BLUE SHIELD | Admitting: Certified Nurse Midwife

## 2018-10-19 ENCOUNTER — Encounter: Payer: Self-pay | Admitting: Certified Nurse Midwife

## 2018-10-19 DIAGNOSIS — Z3493 Encounter for supervision of normal pregnancy, unspecified, third trimester: Secondary | ICD-10-CM

## 2018-10-19 LAB — POCT URINALYSIS DIPSTICK OB
Bilirubin, UA: NEGATIVE
Blood, UA: NEGATIVE
Glucose, UA: NEGATIVE
Ketones, UA: NEGATIVE
Leukocytes, UA: NEGATIVE
Nitrite, UA: NEGATIVE
POC,PROTEIN,UA: NEGATIVE
Spec Grav, UA: 1.02 (ref 1.010–1.025)
Urobilinogen, UA: 0.2 E.U./dL
pH, UA: 5 (ref 5.0–8.0)

## 2018-10-19 LAB — OB RESULTS CONSOLE GBS: GBS: POSITIVE

## 2018-10-19 LAB — OB RESULTS CONSOLE GC/CHLAMYDIA
Chlamydia: NEGATIVE
Gonorrhea: NEGATIVE

## 2018-10-19 NOTE — Addendum Note (Signed)
Addended by: Raliegh Ip on: 10/19/2018 11:02 AM   Modules accepted: Orders

## 2018-10-19 NOTE — Patient Instructions (Signed)
Group B Streptococcus Infection During Pregnancy  Group B Streptococcus (GBS) is a type of bacteria (Streptococcus agalactiae) that is often found in healthy people, commonly in the rectum, vagina, and intestines. In people who are healthy and not pregnant, the bacteria rarely cause serious illness or complications. However, women who test positive for GBS during pregnancy can pass the bacteria to their baby during childbirth, which can cause serious infection in the baby after birth. Women with GBS may also have infections during their pregnancy or immediately after childbirth, such as urinary tract infections (UTIs) or infections of the uterus (uterine infections). Having GBS also increases a woman's risk of complications during pregnancy, such as early (preterm) labor or delivery, miscarriage, or stillbirth. Routine testing (screening) for GBS is recommended for all pregnant women. What increases the risk? You may have a higher risk for GBS infection during pregnancy if you had one during a past pregnancy. What are the signs or symptoms? In most cases, GBS infection does not cause symptoms in pregnant women. Signs and symptoms of a possible GBS-related infection may include:  Labor starting before the 37th week of pregnancy.  A UTI or bladder infection, which may cause: ? Fever. ? Pain or burning during urination. ? Frequent urination.  Fever during labor, along with: ? Bad-smelling discharge. ? Uterine tenderness. ? Rapid heartbeat in the mother, baby, or both. Rare but serious symptoms of a possible GBS-related infection in women include:  Blood infection (septicemia). This may cause fever, chills, or confusion.  Lung infection (pneumonia). This may cause fever, chills, cough, rapid breathing, difficulty breathing, or chest pain.  Bone, joint, skin, or soft tissue infection. How is this diagnosed? You may be screened for GBS between week 35 and week 37 of your pregnancy. If you have  symptoms of preterm labor, you may be screened earlier. This condition is diagnosed based on lab test results from:  A swab of fluid from the vagina and rectum.  A urine sample. How is this treated? This condition is treated with antibiotic medicine. When you go into labor, or as soon as your water breaks (your membranes rupture), you will be given antibiotics through an IV tube. Antibiotics will continue until after you give birth. If you are having a cesarean delivery, you do not need antibiotics unless your membranes have already ruptured. Follow these instructions at home:  Take over-the-counter and prescription medicines only as told by your health care provider.  Take your antibiotic medicine as told by your health care provider. Do not stop taking the antibiotic even if you start to feel better.  Keep all pre-birth (prenatal) visits and follow-up visits as told by your health care provider. This is important. Contact a health care provider if:  You have pain or burning when you urinate.  You have to urinate frequently.  You have a fever or chills.  You develop a bad-smelling vaginal discharge. Get help right away if:  Your membranes rupture.  You go into labor.  You have severe pain in your abdomen.  You have difficulty breathing.  You have chest pain. This information is not intended to replace advice given to you by your health care provider. Make sure you discuss any questions you have with your health care provider. Document Released: 06/07/2007 Document Revised: 06/21/2018 Document Reviewed: 09/24/2015 Elsevier Patient Education  2020 Elsevier Inc.  

## 2018-10-19 NOTE — Progress Notes (Signed)
ROB doing well. Feels good movement. GBS and cultures today. SVE 1/50/-3. Reviewed birth plan. Copy scanned to chart. Labor precautions reviewed. Follow up 1 wk.   Philip Aspen, CNM

## 2018-10-22 LAB — GC/CHLAMYDIA PROBE AMP

## 2018-10-22 LAB — STREP GP B NAA

## 2018-10-24 LAB — STREP GP B NAA: Strep Gp B NAA: POSITIVE — AB

## 2018-10-24 LAB — SPECIMEN STATUS REPORT

## 2018-10-26 ENCOUNTER — Ambulatory Visit (INDEPENDENT_AMBULATORY_CARE_PROVIDER_SITE_OTHER): Payer: BLUE CROSS/BLUE SHIELD | Admitting: Obstetrics and Gynecology

## 2018-10-26 ENCOUNTER — Other Ambulatory Visit: Payer: Self-pay

## 2018-10-26 VITALS — BP 130/82 | HR 88 | Wt 160.7 lb

## 2018-10-26 DIAGNOSIS — Z3493 Encounter for supervision of normal pregnancy, unspecified, third trimester: Secondary | ICD-10-CM

## 2018-10-26 LAB — SPECIMEN STATUS REPORT

## 2018-10-26 LAB — POCT URINALYSIS DIPSTICK OB
Bilirubin, UA: NEGATIVE
Blood, UA: NEGATIVE
Glucose, UA: NEGATIVE
Ketones, UA: NEGATIVE
Leukocytes, UA: NEGATIVE
Nitrite, UA: NEGATIVE
POC,PROTEIN,UA: NEGATIVE
Spec Grav, UA: 1.015 (ref 1.010–1.025)
Urobilinogen, UA: 0.2 E.U./dL
pH, UA: 7 (ref 5.0–8.0)

## 2018-10-26 LAB — GC/CHLAMYDIA PROBE AMP
Chlamydia trachomatis, NAA: NEGATIVE
Neisseria Gonorrhoeae by PCR: NEGATIVE

## 2018-10-26 NOTE — Progress Notes (Signed)
ROB- doing well, labor precautions discussed. GBS prophylaxis discussed.

## 2018-10-26 NOTE — Progress Notes (Signed)
ROB- pt is having some pelvic pressure 

## 2018-11-02 ENCOUNTER — Ambulatory Visit (INDEPENDENT_AMBULATORY_CARE_PROVIDER_SITE_OTHER): Payer: BLUE CROSS/BLUE SHIELD | Admitting: Certified Nurse Midwife

## 2018-11-02 ENCOUNTER — Other Ambulatory Visit: Payer: Self-pay

## 2018-11-02 VITALS — BP 133/82 | HR 90 | Wt 161.2 lb

## 2018-11-02 DIAGNOSIS — Z3493 Encounter for supervision of normal pregnancy, unspecified, third trimester: Secondary | ICD-10-CM

## 2018-11-02 LAB — POCT URINALYSIS DIPSTICK OB
Bilirubin, UA: NEGATIVE
Blood, UA: NEGATIVE
Glucose, UA: NEGATIVE
Nitrite, UA: NEGATIVE
Spec Grav, UA: 1.02 (ref 1.010–1.025)
Urobilinogen, UA: 0.2 E.U./dL
pH, UA: 6 (ref 5.0–8.0)

## 2018-11-02 NOTE — Progress Notes (Signed)
ROB-No complaints.  

## 2018-11-02 NOTE — Progress Notes (Signed)
ROB-Doing well, no questions of concerns. Anticipatory guidance regarding course of prenatal care. Reviewed red flag symptoms, signs of labor, and when to call. RTC x 1 week for ROB or sooner if needed.

## 2018-11-02 NOTE — Patient Instructions (Signed)

## 2018-11-05 DIAGNOSIS — D179 Benign lipomatous neoplasm, unspecified: Secondary | ICD-10-CM | POA: Diagnosis not present

## 2018-11-05 DIAGNOSIS — L814 Other melanin hyperpigmentation: Secondary | ICD-10-CM | POA: Diagnosis not present

## 2018-11-05 DIAGNOSIS — L821 Other seborrheic keratosis: Secondary | ICD-10-CM | POA: Diagnosis not present

## 2018-11-09 ENCOUNTER — Ambulatory Visit (INDEPENDENT_AMBULATORY_CARE_PROVIDER_SITE_OTHER): Payer: BLUE CROSS/BLUE SHIELD | Admitting: Certified Nurse Midwife

## 2018-11-09 ENCOUNTER — Other Ambulatory Visit: Payer: Self-pay

## 2018-11-09 VITALS — BP 140/81 | HR 79 | Wt 158.8 lb

## 2018-11-09 DIAGNOSIS — Z3493 Encounter for supervision of normal pregnancy, unspecified, third trimester: Secondary | ICD-10-CM

## 2018-11-09 LAB — POCT URINALYSIS DIPSTICK OB
Bilirubin, UA: NEGATIVE
Blood, UA: NEGATIVE
Glucose, UA: NEGATIVE
Ketones, UA: NEGATIVE
Nitrite, UA: NEGATIVE
POC,PROTEIN,UA: NEGATIVE
Spec Grav, UA: 1.01 (ref 1.010–1.025)
Urobilinogen, UA: 0.2 E.U./dL
pH, UA: 6 (ref 5.0–8.0)

## 2018-11-09 NOTE — Patient Instructions (Signed)

## 2018-11-09 NOTE — Progress Notes (Signed)
ROB-No complaints.  

## 2018-11-09 NOTE — Progress Notes (Signed)
ROB-Doing well, no concerns. Questions answered about infant sleeping safety. Anticipatory guidance regarding course of prenatal care. Reviewed red flag symptoms, signs of labor, and when to call. RTC x 1 week for ROB or sooner if needed.

## 2018-11-14 ENCOUNTER — Telehealth: Payer: Self-pay

## 2018-11-14 ENCOUNTER — Ambulatory Visit (INDEPENDENT_AMBULATORY_CARE_PROVIDER_SITE_OTHER): Payer: BLUE CROSS/BLUE SHIELD | Admitting: Certified Nurse Midwife

## 2018-11-14 ENCOUNTER — Other Ambulatory Visit: Payer: Self-pay

## 2018-11-14 ENCOUNTER — Other Ambulatory Visit: Payer: Self-pay | Admitting: Certified Nurse Midwife

## 2018-11-14 ENCOUNTER — Encounter: Payer: Self-pay | Admitting: Certified Nurse Midwife

## 2018-11-14 VITALS — BP 149/77 | HR 91 | Wt 159.4 lb

## 2018-11-14 DIAGNOSIS — Z3490 Encounter for supervision of normal pregnancy, unspecified, unspecified trimester: Secondary | ICD-10-CM

## 2018-11-14 DIAGNOSIS — O480 Post-term pregnancy: Secondary | ICD-10-CM

## 2018-11-14 DIAGNOSIS — Z3403 Encounter for supervision of normal first pregnancy, third trimester: Secondary | ICD-10-CM

## 2018-11-14 LAB — POCT URINALYSIS DIPSTICK OB
Bilirubin, UA: NEGATIVE
Blood, UA: NEGATIVE
Glucose, UA: NEGATIVE
Ketones, UA: NEGATIVE
Leukocytes, UA: NEGATIVE
Nitrite, UA: NEGATIVE
POC,PROTEIN,UA: NEGATIVE
Spec Grav, UA: 1.01 (ref 1.010–1.025)
Urobilinogen, UA: 0.2 E.U./dL
pH, UA: 5 (ref 5.0–8.0)

## 2018-11-14 NOTE — Progress Notes (Signed)
Jamie Dudley doing well. Feels good movement and states she has had a few contractions. She feels like baby has dropped. She complains of having episodes of epigastric pain-mild and spots in front of eyes. Swelling negative, reflexes 2+ bilaterally, negative clonus. She denies contractions. Labs today. PRE-E S &S reviewed. Will follow up with results. Return on Tuesday for BPP/growth u/s and Jamie Dudley. Discussed induction @ 41 wks. Will work on scheduling. Follow up as directed with Sharyn Lull.   Philip Aspen, CNM

## 2018-11-14 NOTE — Patient Instructions (Signed)
Braxton Hicks Contractions Contractions of the uterus can occur throughout pregnancy, but they are not always a sign that you are in labor. You may have practice contractions called Braxton Hicks contractions. These false labor contractions are sometimes confused with true labor. What are Braxton Hicks contractions? Braxton Hicks contractions are tightening movements that occur in the muscles of the uterus before labor. Unlike true labor contractions, these contractions do not result in opening (dilation) and thinning of the cervix. Toward the end of pregnancy (32-34 weeks), Braxton Hicks contractions can happen more often and may become stronger. These contractions are sometimes difficult to tell apart from true labor because they can be very uncomfortable. You should not feel embarrassed if you go to the hospital with false labor. Sometimes, the only way to tell if you are in true labor is for your health care provider to look for changes in the cervix. The health care provider will do a physical exam and may monitor your contractions. If you are not in true labor, the exam should show that your cervix is not dilating and your water has not broken. If there are no other health problems associated with your pregnancy, it is completely safe for you to be sent home with false labor. You may continue to have Braxton Hicks contractions until you go into true labor. How to tell the difference between true labor and false labor True labor  Contractions last 30-70 seconds.  Contractions become very regular.  Discomfort is usually felt in the top of the uterus, and it spreads to the lower abdomen and low back.  Contractions do not go away with walking.  Contractions usually become more intense and increase in frequency.  The cervix dilates and gets thinner. False labor  Contractions are usually shorter and not as strong as true labor contractions.  Contractions are usually irregular.  Contractions  are often felt in the front of the lower abdomen and in the groin.  Contractions may go away when you walk around or change positions while lying down.  Contractions get weaker and are shorter-lasting as time goes on.  The cervix usually does not dilate or become thin. Follow these instructions at home:   Take over-the-counter and prescription medicines only as told by your health care provider.  Keep up with your usual exercises and follow other instructions from your health care provider.  Eat and drink lightly if you think you are going into labor.  If Braxton Hicks contractions are making you uncomfortable: ? Change your position from lying down or resting to walking, or change from walking to resting. ? Sit and rest in a tub of warm water. ? Drink enough fluid to keep your urine pale yellow. Dehydration may cause these contractions. ? Do slow and deep breathing several times an hour.  Keep all follow-up prenatal visits as told by your health care provider. This is important. Contact a health care provider if:  You have a fever.  You have continuous pain in your abdomen. Get help right away if:  Your contractions become stronger, more regular, and closer together.  You have fluid leaking or gushing from your vagina.  You pass blood-tinged mucus (bloody show).  You have bleeding from your vagina.  You have low back pain that you never had before.  You feel your baby's head pushing down and causing pelvic pressure.  Your baby is not moving inside you as much as it used to. Summary  Contractions that occur before labor are   called Braxton Hicks contractions, false labor, or practice contractions.  Braxton Hicks contractions are usually shorter, weaker, farther apart, and less regular than true labor contractions. True labor contractions usually become progressively stronger and regular, and they become more frequent.  Manage discomfort from Braxton Hicks contractions  by changing position, resting in a warm bath, drinking plenty of water, or practicing deep breathing. This information is not intended to replace advice given to you by your health care provider. Make sure you discuss any questions you have with your health care provider. Document Released: 07/14/2016 Document Revised: 02/10/2017 Document Reviewed: 07/14/2016 Elsevier Patient Education  2020 Elsevier Inc.  

## 2018-11-14 NOTE — Telephone Encounter (Signed)
mychart message sent

## 2018-11-14 NOTE — Telephone Encounter (Signed)
See mychart messages dated 11/14/18

## 2018-11-15 LAB — CBC
Hematocrit: 39 % (ref 34.0–46.6)
Hemoglobin: 13 g/dL (ref 11.1–15.9)
MCH: 29.7 pg (ref 26.6–33.0)
MCHC: 33.3 g/dL (ref 31.5–35.7)
MCV: 89 fL (ref 79–97)
Platelets: 166 10*3/uL (ref 150–450)
RBC: 4.38 x10E6/uL (ref 3.77–5.28)
RDW: 12.8 % (ref 11.7–15.4)
WBC: 8.1 10*3/uL (ref 3.4–10.8)

## 2018-11-15 LAB — COMPREHENSIVE METABOLIC PANEL
ALT: 10 IU/L (ref 0–32)
AST: 15 IU/L (ref 0–40)
Albumin/Globulin Ratio: 1.3 (ref 1.2–2.2)
Albumin: 3.5 g/dL — ABNORMAL LOW (ref 3.8–4.8)
Alkaline Phosphatase: 163 IU/L — ABNORMAL HIGH (ref 39–117)
BUN/Creatinine Ratio: 16 (ref 9–23)
BUN: 11 mg/dL (ref 6–20)
Bilirubin Total: 0.3 mg/dL (ref 0.0–1.2)
CO2: 20 mmol/L (ref 20–29)
Calcium: 8.4 mg/dL — ABNORMAL LOW (ref 8.7–10.2)
Chloride: 104 mmol/L (ref 96–106)
Creatinine, Ser: 0.68 mg/dL (ref 0.57–1.00)
GFR calc Af Amer: 129 mL/min/{1.73_m2} (ref >59)
GFR calc non Af Amer: 112 mL/min/{1.73_m2} (ref >59)
Globulin, Total: 2.6 g/dL (ref 1.5–4.5)
Glucose: 76 mg/dL (ref 65–99)
Potassium: 4 mmol/L (ref 3.5–5.2)
Sodium: 138 mmol/L (ref 134–144)
Total Protein: 6.1 g/dL (ref 6.0–8.5)

## 2018-11-15 LAB — PROTEIN / CREATININE RATIO, URINE
Creatinine, Urine: 182.8 mg/dL
Protein, Ur: 21.6 mg/dL
Protein/Creat Ratio: 118 mg/g creat (ref 0–200)

## 2018-11-16 ENCOUNTER — Other Ambulatory Visit: Payer: Self-pay

## 2018-11-16 ENCOUNTER — Other Ambulatory Visit
Admission: RE | Admit: 2018-11-16 | Discharge: 2018-11-16 | Disposition: A | Discharge status: Home / Self Care | Payer: BLUE CROSS/BLUE SHIELD | Source: Ambulatory Visit | Attending: Certified Nurse Midwife | Admitting: Certified Nurse Midwife

## 2018-11-16 DIAGNOSIS — Z01812 Encounter for preprocedural laboratory examination: Secondary | ICD-10-CM | POA: Diagnosis not present

## 2018-11-16 DIAGNOSIS — Z20828 Contact with and (suspected) exposure to other viral communicable diseases: Secondary | ICD-10-CM | POA: Diagnosis not present

## 2018-11-16 LAB — SARS CORONAVIRUS 2 (TAT 6-24 HRS): SARS Coronavirus 2: NEGATIVE

## 2018-11-17 ENCOUNTER — Inpatient Hospital Stay: Admit: 2018-11-17 | Payer: Self-pay

## 2018-11-20 ENCOUNTER — Encounter: Payer: Self-pay | Admitting: Certified Nurse Midwife

## 2018-11-20 ENCOUNTER — Other Ambulatory Visit: Payer: BLUE CROSS/BLUE SHIELD

## 2018-11-20 ENCOUNTER — Ambulatory Visit (INDEPENDENT_AMBULATORY_CARE_PROVIDER_SITE_OTHER): Payer: BLUE CROSS/BLUE SHIELD | Admitting: Certified Nurse Midwife

## 2018-11-20 ENCOUNTER — Other Ambulatory Visit: Payer: Self-pay

## 2018-11-20 VITALS — BP 143/88 | HR 93 | Wt 160.1 lb

## 2018-11-20 DIAGNOSIS — Z3A40 40 weeks gestation of pregnancy: Secondary | ICD-10-CM

## 2018-11-20 DIAGNOSIS — O480 Post-term pregnancy: Secondary | ICD-10-CM

## 2018-11-20 LAB — POCT URINALYSIS DIPSTICK OB
Bilirubin, UA: NEGATIVE
Glucose, UA: NEGATIVE
Ketones, UA: NEGATIVE
Nitrite, UA: NEGATIVE
Spec Grav, UA: 1.025 (ref 1.010–1.025)
Urobilinogen, UA: 0.2 E.U./dL
pH, UA: 6 (ref 5.0–8.0)

## 2018-11-20 NOTE — Patient Instructions (Addendum)
Balloon Catheter Placement for Cervical Ripening  Balloon catheter placement for cervical ripening is a procedure to help your cervix start to soften (ripen) and open (dilate). It is done to prepare your body for labor induction. During this procedure, a thin tube (catheter) is placed through your cervix. A tiny balloon attached to the catheter is inflated with water. Pressure from the balloon is what helps your cervix start to open. Cervical ripening with a balloon catheter can make labor induction shorter and easier.  You may have this procedure if:  · Your cervix is not ready for labor.  · Your health care provider has planned labor induction.  · You are not having twins or multiples.  · Your baby is in the head-down position.  · You do not have any other pregnancy complications that require you to be monitored in the hospital after balloon catheter placement.  If your health care provider has recommended labor induction to stimulate a vaginal birth, this procedure may be started the day before induction. You will go home with the balloon in place and return to start induction in 12-24 hours. You may have this procedure and stay in the hospital so that your progress can be monitored as well.  Tell a health care provider about:  · Any allergies you have.  · All medicines you are taking, including vitamins, herbs, eye drops, creams, and over-the-counter medicines.  · Any blood disorders you have.  · Any surgeries you have had.  · Any medical conditions you have.  What are the risks?  Generally, this is a safe procedure. However, problems may occur, including:  · Infection.  · Bleeding.  · Cramping or pain.  · Difficulty passing urine.  · The baby moving from the head-down position to a position with the feet or buttocks down (breech position).  What happens before the procedure?  · Your health care provider may check your baby's heartbeat (fetal monitoring) before the procedure.  · You may be asked to empty your  bladder.  What happens during the procedure?    · You will be positioned on the exam table as if you were having a pelvic exam or Pap test.  · Your health care provider may insert a medical instrument into your vagina (speculum) to see your cervix.  · Your cervix may be cleaned with a germ-killing solution.  · The catheter will be inserted through the opening of your cervix.  · A balloon on the end of the catheter will be inflated with sterile water. Some catheters have two balloons, one on each side of the cervix.  · Depending on the type of balloon catheter, the end of the catheter may be left free outside your cervix or taped to your leg.  The procedure may vary among health care providers and hospitals.  What can I expect after the procedure?  · After the procedure, it is common to have:  ? A feeling of pressure inside your vagina.  ? Light vaginal bleeding (spotting).  · You may have fetal monitoring before going home.  · You may be sent home with the catheter in place and asked to return to start your induction in about 12-24 hours.  Follow these instructions at home:  · Take over-the-counter and prescription medicines only as told by your health care provider.  · Return to your normal activities at home as told by your health care provider. Ask your health care provider what activities are safe for you.   fall out before you return for labor induction. Ask your health care provider what you should do if this happens.  Keep all follow-up visits as told by your health care provider. This is important. You will need to return to start induction as told by your health care provider. Contact your health care provider if:  You have chills or a fever.  You have constant pain or cramps (not  contractions).  You have trouble passing urine.  Your water breaks.  You have vaginal bleeding that is heavier than spotting.  You have contractions that start to last longer and come closer together (about every 5 minutes).  The balloon catheter falls out before you return to start your induction. Summary  Cervical ripening with placement of a balloon catheter is an outpatient procedure to prepare you for labor induction.  Cervical ripening with a balloon catheter helps your cervix start to open for birth.  You will be positioned on the exam table. The catheter will be inserted through the opening of your cervix. A balloon on the end of the catheter will be inflated with water.  Pressure from the balloon will cause ripening of your cervix. You will go home with the balloon in place and return to start induction in 12-24 hours.  Contact your health care provider if you have pain, fever, vaginal bleeding, or trouble passing urine. Also contact him or her if your water breaks, you start to go into labor, or your balloon catheter falls out before you return to start your induction. This information is not intended to replace advice given to you by your health care provider. Make sure you discuss any questions you have with your health care provider. Document Released: 10/27/2017 Document Revised: 10/27/2017 Document Reviewed: 10/27/2017 Elsevier Patient Education  2020 [employer].   Labor Induction  Labor induction is when steps are taken to cause a pregnant woman to begin the labor process. Most women go into labor on their own between 37 weeks and 42 weeks of pregnancy. When this does not happen or when there is a medical need for labor to begin, steps may be taken to induce labor. Labor induction causes a pregnant woman's uterus to contract. It also causes the cervix to soften (ripen), open (dilate), and thin out (efface). Usually, labor is not induced before 39 weeks of pregnancy  unless there is a medical reason to do so. Your health care provider will determine if labor induction is needed. Before inducing labor, your health care provider will consider a number of factors, including:  Your medical condition and your baby's.  How many weeks along you are in your pregnancy.  How mature your baby's lungs are.  The condition of your cervix.  The position of your baby.  The size of your birth canal. What are some reasons for labor induction? Labor may be induced if:  Your health or your baby's health is at risk.  Your pregnancy is overdue by 1 week or more.  Your water breaks but labor does not start on its own.  There is a low amount of amniotic fluid around your baby. You may also choose (elect) to have labor induced at a certain time. Generally, elective labor induction is done no earlier than 39 weeks of pregnancy. What methods are used for labor induction? Methods used for labor induction include:  Prostaglandin medicine. This medicine starts contractions and causes the cervix to dilate and ripen. It can be taken by mouth (orally) or by  being inserted into the vagina (suppository).  Inserting a small, thin tube (catheter) with a balloon into the vagina and then expanding the balloon with water to dilate the cervix.  Stripping the membranes. In this method, your health care provider gently separates amniotic sac tissue from the cervix. This causes the cervix to stretch, which in turn causes the release of a hormone called progesterone. The hormone causes the uterus to contract. This procedure is often done during an office visit, after which you will be sent home to wait for contractions to begin.  Breaking the water. In this method, your health care provider uses a small instrument to make a small hole in the amniotic sac. This eventually causes the amniotic sac to break. Contractions should begin after a few hours.  Medicine to trigger or strengthen  contractions. This medicine is given through an IV that is inserted into a vein in your arm. Except for membrane stripping, which can be done in a clinic, labor induction is done in the hospital so that you and your baby can be carefully monitored. How long does it take for labor to be induced? The length of time it takes to induce labor depends on how ready your body is for labor. Some inductions can take up to 2-3 days, while others may take less than a day. Induction may take longer if:  You are induced early in your pregnancy.  It is your first pregnancy.  Your cervix is not ready. What are some risks associated with labor induction? Some risks associated with labor induction include:  Changes in fetal heart rate, such as being too high, too low, or irregular (erratic).  Failed induction.  Infection in the mother or the baby.  Increased risk of having a cesarean delivery.  Fetal death.  Breaking off (abruption) of the placenta from the uterus (rare).  Rupture of the uterus (very rare). When induction is needed for medical reasons, the benefits of induction generally outweigh the risks. What are some reasons for not inducing labor? Labor induction should not be done if:  Your baby does not tolerate contractions.  You have had previous surgeries on your uterus, such as a myomectomy, removal of fibroids, or a vertical scar from a previous cesarean delivery.  Your placenta lies very low in your uterus and blocks the opening of the cervix (placenta previa).  Your baby is not in a head-down position.  The umbilical cord drops down into the birth canal in front of the baby.  There are unusual circumstances, such as the baby being very early (premature).  You have had more than 2 previous cesarean deliveries. Summary  Labor induction is when steps are taken to cause a pregnant woman to begin the labor process.  Labor induction causes a pregnant woman's uterus to contract.  It also causes the cervix to ripen, dilate, and efface.  Labor is not induced before 39 weeks of pregnancy unless there is a medical reason to do so.  When induction is needed for medical reasons, the benefits of induction generally outweigh the risks. This information is not intended to replace advice given to you by your health care provider. Make sure you discuss any questions you have with your health care provider. Document Released: 07/20/2006 Document Revised: 03/03/2017 Document Reviewed: 04/13/2016 Elsevier Patient Education  2020 [place].   Fetal Movement Counts Patient Name: ________________________________________________ Patient Due Date: ____________________ What is a fetal movement count?  A fetal movement count is the number of  times that you feel your baby move during a certain amount of time. This may also be called a fetal kick count. A fetal movement count is recommended for every pregnant woman. You may be asked to start counting fetal movements as early as week 28 of your pregnancy. Pay attention to when your baby is most active. You may notice your baby's sleep and wake cycles. You may also notice things that make your baby move more. You should do a fetal movement count:  When your baby is normally most active.  At the same time each day. A good time to count movements is while you are resting, after having something to eat and drink. How do I count fetal movements? 1. Find a quiet, comfortable area. Sit, or lie down on your side. 2. Write down the date, the start time and stop time, and the number of movements that you felt between those two times. Take this information with you to your health care visits. 3. For 2 hours, count kicks, flutters, swishes, rolls, and jabs. You should feel at least 10 movements during 2 hours. 4. You may stop counting after you have felt 10 movements. 5. If you do not feel 10 movements in 2 hours, have something to eat and drink.  Then, keep resting and counting for 1 hour. If you feel at least 4 movements during that hour, you may stop counting. Contact a health care provider if:  You feel fewer than 4 movements in 2 hours.  Your baby is not moving like he or she usually does. Date: ____________ Start time: ____________ Stop time: ____________ Movements: ____________ Date: ____________ Start time: ____________ Stop time: ____________ Movements: ____________ Date: ____________ Start time: ____________ Stop time: ____________ Movements: ____________ Date: ____________ Start time: ____________ Stop time: ____________ Movements: ____________ Date: ____________ Start time: ____________ Stop time: ____________ Movements: ____________ Date: ____________ Start time: ____________ Stop time: ____________ Movements: ____________ Date: ____________ Start time: ____________ Stop time: ____________ Movements: ____________ Date: ____________ Start time: ____________ Stop time: ____________ Movements: ____________ Date: ____________ Start time: ____________ Stop time: ____________ Movements: ____________ This information is not intended to replace advice given to you by your health care provider. Make sure you discuss any questions you have with your health care provider. Document Released: 03/30/2006 Document Revised: 03/20/2018 Document Reviewed: 04/09/2015 Elsevier Patient Education  2020 [employer]

## 2018-11-20 NOTE — Progress Notes (Signed)
ROB-Patient c/o intermittent vaginal pressure x1 month. Request SVE, no change from previous exam. Education regarding options for induction of labor, pt verbalized understanding. Reviewed red flag symptoms, signs of labor, and when to call. COVID negative, IOL orders placed. RTC x 7 weeks for PPV or sooner if needed.   NONSTRESS TEST INTERPRETATION  INDICATIONS: Postdates pregnancy  FHR baseline: 140 bpm RESULTS:Reactive COMMENTS: Uterine irritability    PLAN: 1. Continue fetal kick counts twice a day. 2. IOL scheduled on Thursday at 0500.    Diona Fanti, CNM [facility], [facility] 11/20/18 5:17 PM

## 2018-11-21 ENCOUNTER — Other Ambulatory Visit: Payer: Self-pay

## 2018-11-21 ENCOUNTER — Inpatient Hospital Stay
Admission: EM | Admit: 2018-11-21 | Discharge: 2018-11-22 | DRG: 807 | Disposition: A | Discharge status: Home / Self Care | Payer: BLUE CROSS/BLUE SHIELD | Attending: Obstetrics and Gynecology | Admitting: Obstetrics and Gynecology

## 2018-11-21 DIAGNOSIS — O99824 Streptococcus B carrier state complicating childbirth: Secondary | ICD-10-CM | POA: Diagnosis not present

## 2018-11-21 DIAGNOSIS — B951 Streptococcus, group B, as the cause of diseases classified elsewhere: Secondary | ICD-10-CM | POA: Diagnosis present

## 2018-11-21 DIAGNOSIS — Z23 Encounter for immunization: Secondary | ICD-10-CM | POA: Diagnosis not present

## 2018-11-21 DIAGNOSIS — O26893 Other specified pregnancy related conditions, third trimester: Secondary | ICD-10-CM | POA: Diagnosis not present

## 2018-11-21 DIAGNOSIS — Z3490 Encounter for supervision of normal pregnancy, unspecified, unspecified trimester: Secondary | ICD-10-CM | POA: Diagnosis present

## 2018-11-21 DIAGNOSIS — Z3A40 40 weeks gestation of pregnancy: Secondary | ICD-10-CM

## 2018-11-21 DIAGNOSIS — O480 Post-term pregnancy: Secondary | ICD-10-CM

## 2018-11-21 LAB — CBC
HCT: 41.5 % (ref 36.0–46.0)
Hemoglobin: 13.8 g/dL (ref 12.0–15.0)
MCH: 29.5 pg (ref 26.0–34.0)
MCHC: 33.3 g/dL (ref 30.0–36.0)
MCV: 88.7 fL (ref 80.0–100.0)
Platelets: 201 10*3/uL (ref 150–400)
RBC: 4.68 MIL/uL (ref 3.87–5.11)
RDW: 13.8 % (ref 11.5–15.5)
WBC: 12.6 10*3/uL — ABNORMAL HIGH (ref 4.0–10.5)
nRBC: 0 % (ref 0.0–0.2)

## 2018-11-21 LAB — TYPE AND SCREEN
ABO/RH(D): A POS
Antibody Screen: NEGATIVE

## 2018-11-21 MED ORDER — ONDANSETRON HCL 4 MG/2ML IJ SOLN: 4.0000 mg | INTRAMUSCULAR | Status: DC | PRN

## 2018-11-21 MED ORDER — PRENATAL MULTIVITAMIN CH
1.0000 | ORAL_TABLET | Freq: Every day | ORAL | Status: DC
  Administered 2018-11-21 – 2018-11-22 (×2): 1 via ORAL
  Filled 2018-11-21 (×2): qty 1

## 2018-11-21 MED ORDER — LIDOCAINE HCL (PF) 1 % IJ SOLN
30.0000 mL | INTRAMUSCULAR | Status: DC | PRN
  Administered 2018-11-21: 08:00:00 30 mL via SUBCUTANEOUS

## 2018-11-21 MED ORDER — DOCUSATE SODIUM 100 MG PO CAPS
100.0000 mg | ORAL_CAPSULE | Freq: Two times a day (BID) | ORAL | Status: DC
  Administered 2018-11-21 – 2018-11-22 (×2): 100 mg via ORAL
  Filled 2018-11-21 (×2): qty 1

## 2018-11-21 MED ORDER — OXYTOCIN 40 UNITS IN NORMAL SALINE INFUSION - SIMPLE MED: 2.5000 [IU]/h | INTRAVENOUS | Status: DC

## 2018-11-21 MED ORDER — WITCH HAZEL-GLYCERIN EX PADS
MEDICATED_PAD | CUTANEOUS | Status: AC
  Administered 2018-11-21: 08:00:00
  Filled 2018-11-21: qty 100

## 2018-11-21 MED ORDER — SOD CITRATE-CITRIC ACID 500-334 MG/5ML PO SOLN: 30.0000 mL | ORAL | Status: DC | PRN

## 2018-11-21 MED ORDER — OXYCODONE-ACETAMINOPHEN 5-325 MG PO TABS: 2.0000 | ORAL_TABLET | ORAL | Status: DC | PRN

## 2018-11-21 MED ORDER — LACTATED RINGERS IV SOLN
INTRAVENOUS | Status: DC
  Administered 2018-11-21: 03:00:00 via INTRAVENOUS

## 2018-11-21 MED ORDER — DIBUCAINE (PERIANAL) 1 % EX OINT: 1.0000 "application " | TOPICAL_OINTMENT | CUTANEOUS | Status: DC | PRN

## 2018-11-21 MED ORDER — BUTORPHANOL TARTRATE 1 MG/ML IJ SOLN: 1.0000 mg | INTRAMUSCULAR | Status: DC | PRN

## 2018-11-21 MED ORDER — ONDANSETRON HCL 4 MG PO TABS: 4.0000 mg | ORAL_TABLET | ORAL | Status: DC | PRN

## 2018-11-21 MED ORDER — OXYTOCIN BOLUS FROM INFUSION
500.0000 mL | Freq: Once | INTRAVENOUS | Status: AC
  Administered 2018-11-21: 08:00:00 500 mL via INTRAVENOUS

## 2018-11-21 MED ORDER — IBUPROFEN 600 MG PO TABS
600.0000 mg | ORAL_TABLET | Freq: Four times a day (QID) | ORAL | Status: DC
  Administered 2018-11-21 – 2018-11-22 (×5): 600 mg via ORAL
  Filled 2018-11-21 (×5): qty 1

## 2018-11-21 MED ORDER — ACETAMINOPHEN 325 MG PO TABS: 650.0000 mg | ORAL_TABLET | ORAL | Status: DC | PRN

## 2018-11-21 MED ORDER — LACTATED RINGERS IV SOLN: 500.0000 mL | INTRAVENOUS | Status: DC | PRN

## 2018-11-21 MED ORDER — MISOPROSTOL 25 MCG QUARTER TABLET: 25.0000 ug | ORAL_TABLET | ORAL | Status: DC | PRN

## 2018-11-21 MED ORDER — AMMONIA AROMATIC IN INHA
RESPIRATORY_TRACT | Status: AC
  Filled 2018-11-21: qty 10

## 2018-11-21 MED ORDER — INFLUENZA VAC SPLIT QUAD 0.5 ML IM SUSY
0.5000 mL | PREFILLED_SYRINGE | INTRAMUSCULAR | Status: AC
  Administered 2018-11-22: 11:00:00 0.5 mL via INTRAMUSCULAR
  Filled 2018-11-21: qty 0.5

## 2018-11-21 MED ORDER — BENZOCAINE-MENTHOL 20-0.5 % EX AERO
1.0000 "application " | INHALATION_SPRAY | CUTANEOUS | Status: DC | PRN
  Filled 2018-11-21: qty 56

## 2018-11-21 MED ORDER — SODIUM CHLORIDE 0.9 % IV SOLN
5.0000 10*6.[IU] | Freq: Once | INTRAVENOUS | Status: AC
  Administered 2018-11-21: 04:00:00 5 10*6.[IU] via INTRAVENOUS
  Filled 2018-11-21: qty 5

## 2018-11-21 MED ORDER — TERBUTALINE SULFATE 1 MG/ML IJ SOLN: 0.2500 mg | Freq: Once | INTRAMUSCULAR | Status: DC | PRN

## 2018-11-21 MED ORDER — SIMETHICONE 80 MG PO CHEW: 80.0000 mg | CHEWABLE_TABLET | ORAL | Status: DC | PRN

## 2018-11-21 MED ORDER — COCONUT OIL OIL
1.0000 "application " | TOPICAL_OIL | Status: DC | PRN
  Filled 2018-11-21: qty 120

## 2018-11-21 MED ORDER — DIPHENHYDRAMINE HCL 25 MG PO CAPS: 25.0000 mg | ORAL_CAPSULE | Freq: Four times a day (QID) | ORAL | Status: DC | PRN

## 2018-11-21 MED ORDER — ZOLPIDEM TARTRATE 5 MG PO TABS: 5.0000 mg | ORAL_TABLET | Freq: Every evening | ORAL | Status: DC | PRN

## 2018-11-21 MED ORDER — ONDANSETRON HCL 4 MG/2ML IJ SOLN
4.0000 mg | Freq: Four times a day (QID) | INTRAMUSCULAR | Status: DC | PRN
  Administered 2018-11-21: 03:00:00 4 mg via INTRAVENOUS

## 2018-11-21 MED ORDER — OXYTOCIN 40 UNITS IN NORMAL SALINE INFUSION - SIMPLE MED: 1.0000 m[IU]/min | INTRAVENOUS | Status: DC

## 2018-11-21 MED ORDER — LIDOCAINE HCL (PF) 1 % IJ SOLN
INTRAMUSCULAR | Status: AC
  Administered 2018-11-21: 08:00:00 30 mL via SUBCUTANEOUS
  Filled 2018-11-21: qty 30

## 2018-11-21 MED ORDER — OXYTOCIN 40 UNITS IN NORMAL SALINE INFUSION - SIMPLE MED
INTRAVENOUS | Status: AC
  Administered 2018-11-21: 500 mL via INTRAVENOUS
  Filled 2018-11-21: qty 1000

## 2018-11-21 MED ORDER — OXYTOCIN 10 UNIT/ML IJ SOLN
INTRAMUSCULAR | Status: AC
  Filled 2018-11-21: qty 2

## 2018-11-21 MED ORDER — MISOPROSTOL 200 MCG PO TABS
ORAL_TABLET | ORAL | Status: AC
  Filled 2018-11-21: qty 4

## 2018-11-21 MED ORDER — PENICILLIN G 3 MILLION UNITS IVPB - SIMPLE MED
3.0000 10*6.[IU] | INTRAVENOUS | Status: DC
  Administered 2018-11-21: 07:00:00 3 10*6.[IU] via INTRAVENOUS
  Filled 2018-11-21: qty 100

## 2018-11-21 MED ORDER — WITCH HAZEL-GLYCERIN EX PADS
1.0000 "application " | MEDICATED_PAD | CUTANEOUS | Status: DC | PRN
  Filled 2018-11-21: qty 100

## 2018-11-21 MED ORDER — MISOPROSTOL 50MCG HALF TABLET: 50.0000 ug | ORAL_TABLET | ORAL | Status: DC | PRN

## 2018-11-21 MED ORDER — OXYCODONE-ACETAMINOPHEN 5-325 MG PO TABS: 1.0000 | ORAL_TABLET | ORAL | Status: DC | PRN

## 2018-11-21 MED ORDER — ONDANSETRON HCL 4 MG/2ML IJ SOLN
INTRAMUSCULAR | Status: AC
  Filled 2018-11-21: qty 2

## 2018-11-21 NOTE — H&P (Signed)
Obstetric History and Physical  Jamie Dudley is a 38 y.o. G1P0000 with IUP at [redacted]w[redacted]d presenting with irregular contractions and vomiting, was planned IOL for tomorrow, but appears to be in active labor, will keep and move towards delivery. Patient states she has been having  irregular, every 5-4 minutes contractions, none vaginal bleeding, intact membranes, with active fetal movement.    Prenatal Course [facility] Care: [facility]  Pregnancy complications or risks:none  Prenatal labs and studies: ABO, Rh: --/--/A POS (09/09 0259) Antibody: NEG (09/09 0259) Rubella: 1.28 (02/13 1553) RPR: Non Reactive (06/11 1004)  HBsAg: Negative (02/13 1553)  HIV: Non Reactive (02/13 1553)  OZH:YQMVHQIO, Positive (08/07 1600) 1 hr Glucola  normal Genetic screening normal Anatomy US normal  Past Medical History:  Diagnosis Date  . Atypical squamous cells of undetermined significance (ASC-US) on cervical Pap smear   . Depression   . Sinusitis   . Skin-picking disorder     Past Surgical History:  Procedure Laterality Date  . COLPOSCOPY    . WISDOM TOOTH EXTRACTION      OB History  Gravida Para Term Preterm AB Living  1 0 0 0 0 0  SAB TAB Ectopic Multiple Live Births  0 0 0 0 0    # Outcome Date GA Lbr Len/2nd Weight Sex Delivery Anes PTL Lv  1 Current             Social History   Socioeconomic History  . Marital status: Single    Spouse name: Not on file  . Number of children: Not on file  . Years of education: Not on file  . Highest education level: Not on file  Occupational History  . Not on file  Social Needs  . Financial resource strain: Not on file  . Food insecurity    Worry: Not on file    Inability: Not on file  . Transportation needs    Medical: Not on file    Non-medical: Not on file  Tobacco Use  . Smoking status: Never Smoker  . Smokeless tobacco: Never Used  Substance and Sexual Activity  . Alcohol use: Not Currently    Comment: rare  . Drug use: No  . Sexual  activity: Not Currently    Birth control/protection: None  Lifestyle  . Physical activity    Days per week: Not on file    Minutes per session: Not on file  . Stress: Not on file  Relationships  . Social Herbalist on phone: Not on file    Gets together: Not on file    Attends religious service: Not on file    Active member of club or organization: Not on file    Attends meetings of clubs or organizations: Not on file    Relationship status: Not on file  Other Topics Concern  . Not on file  Social History Narrative  . Not on file    Family History  Problem Relation Age of Onset  . Heart disease Paternal Grandfather   . Hypertension Mother     Medications Prior to Admission  Medication Sig Dispense Refill Last Dose  . Prenatal Vit-Fe Fumarate-FA (PRENATAL MULTIVITAMIN) TABS tablet Take 1 tablet by mouth daily at 12 noon.   11/20/2018 at Unknown time    No Known Allergies  Review of Systems: Negative except for what is mentioned in HPI.  Physical Exam: BP 123/85   Pulse 79   Temp 99.2 F (37.3 C) (Axillary)  Resp 18   Ht 5' 5.5" (1.664 m)   Wt 72.6 kg   LMP 02/15/2018 (Exact Date)   SpO2 99%   BMI 26.22 kg/m  GENERAL: Well-developed, well-nourished female in no acute distress.  LUNGS: Clear to auscultation bilaterally.  HEART: Regular rate and rhythm. ABDOMEN: Soft, nontender, nondistended, gravid. EXTREMITIES: Nontender, no edema, 2+ distal pulses. Cervical Exam: Dilation: 7.5 Effacement (%): 80, 90 Station: -2 Exam by:: D. Corse RN; A. White RN FHT:  Baseline rate 140 bpm   Variability moderate  Accelerations present   Decelerations none Contractions: Every 5-6 mins   Pertinent Labs/Studies:   Results for orders placed or performed during the hospital encounter of 11/21/18 (from the past 24 hour(s))  CBC     Status: Abnormal   Collection Time: 11/21/18  2:59 AM  Result Value Ref Range   WBC 12.6 (H) 4.0 - 10.5 K/uL   RBC 4.68 3.87 - 5.11  MIL/uL   Hemoglobin 13.8 12.0 - 15.0 g/dL   HCT 47.841.5 29.536.0 - 62.146.0 %   MCV 88.7 80.0 - 100.0 fL   MCH 29.5 26.0 - 34.0 pg   MCHC 33.3 30.0 - 36.0 g/dL   RDW 30.813.8 65.711.5 - 84.615.5 %   Platelets 201 150 - 400 K/uL   nRBC 0.0 0.0 - 0.2 %  Type and screen     Status: None   Collection Time: 11/21/18  2:59 AM  Result Value Ref Range   ABO/RH(D) A POS    Antibody Screen NEG    Sample Expiration      11/24/2018,2359 Performed at [facility], [address] [place], [place] [id number]     Assessment : Jamie Dudley is a 38 y.o. G1P0000 at 5065w4d being admitted for labor.  Plan: Labor: Expectant management.  Induction/Augmentation as needed, per protocol FWB: Reassuring fetal heart tracing.  GBS positive Delivery plan: Hopeful for vaginal delivery  Melody Shambley, CNM [facility], [facility]

## 2018-11-21 NOTE — Lactation Note (Signed)
This note was copied from a baby's chart. Lactation Consultation Note  Patient Name: Jamie Dudley Date: 11/21/2018 Reason for consult: Initial assessment  Transition RN called out to [facility] for assistance with baby's first breastfeeding post delivery. RN reports that baby has been able to latch, but continues to tuck his bottom lip; causing him to pop on and off. When [facility] arrived, RN was assisting with placing baby at breast in football hold, where baby appeared comfortable and positioned correctly. Baby was able to grasp the breast, and had flanged top lips, but would continue to tuck bottom lip even after correction. After a few minutes baby did not go back onto left breast in football, but continued to root. LC assisted with moving baby into cross cradle position on the right breast. Infant grasped the breast, mom instantly reported a pinching/painful sensation, after correction of bottom lip, feeling improved. However, baby did not sustain latch for a long period, after several attempts a nipple shield was introduced to see if baby could grasp and sustain latch easier. LC placed a 56mm nipple shield on mom and repositioned/latched baby in cross-cradle position back to right breast. Baby easily grasped nipple and sustained latch, with continual correction of pulling out bottom lip. Mom was able to identify a change in his sucking pattern when he re-tucked the lip, and began working to correct it each time independently. Baby sustained the latch for 25 minutes with rhythmic sucking, and occasional faint swallows. Reviewed breastfeeding basics with mom and dad, newborn stomach sizes, feeding behaviors of newborns, early hunger cues, achieving optimal latch. Encouraged ongoing or frequent skin to skin to help promote breastfeeding. Taught mom hand expression, and importance of frequent stimulation.  LC will follow-up with mom and baby once moved to [facility].  Maternal Data Formula Feeding for Exclusion:  No Has patient been taught Hand Expression?: Yes Does the patient have breastfeeding experience prior to this delivery?: No  Feeding Feeding Type: Breast Fed  LATCH Score Latch: Repeated attempts needed to sustain latch, nipple held in mouth throughout feeding, stimulation needed to elicit sucking reflex.  Audible Swallowing: Spontaneous and intermittent  Type of Nipple: Everted at rest and after stimulation(nipple shield)  Comfort (Breast/Nipple): Soft / non-tender  Hold (Positioning): Assistance needed to correctly position infant at breast and maintain latch.  LATCH Score: 8  Interventions Interventions: Breast feeding basics reviewed;Assisted with latch;Breast massage;Hand express;Adjust position;Support pillows  Lactation Tools Discussed/Used Tools: Nipple Shields Nipple shield size: 20   Consult Status Consult Status: Follow-up Date: 11/21/18 Follow-up type: In-patient    Lavonia Drafts 11/21/2018, 11:19 AM

## 2018-11-21 NOTE — Progress Notes (Signed)
Jamie Dudley is a 38 y.o. G1P0000 at [redacted]w[redacted]d by LMP admitted for active labor  Subjective: Reports pain with contractions, feels pressure to push. Is having vagal response with contractions causing her HR to go down to 30s.   Objective: BP 123/85   Pulse 79   Temp 99.2 F (37.3 C) (Axillary)   Resp 18   Ht 5' 5.5" (1.664 m)   Wt 72.6 kg   LMP 02/15/2018 (Exact Date)   SpO2 99%   BMI 26.22 kg/m  No intake/output data recorded. Total I/O In: 118.2 [I.V.:118.2] Out: -   FHT:  FHR: 140 bpm, variability: moderate,  accelerations:  Present,  decelerations:  Present some early and one episode of prolonged variable decel with patient vagaling during contractions. UC:   regular, every 3-5 minutes SVE:   Dilation: 7.5 Effacement (%): 80, 90 Station: -2 Exam by:: D. Corse RN; A. White RN  Labs: Lab Results  Component Value Date   WBC 12.6 (H) 11/21/2018   HGB 13.8 11/21/2018   HCT 41.5 11/21/2018   MCV 88.7 11/21/2018   PLT 201 11/21/2018    Assessment / Plan: Spontaneous labor, progressing normally  Labor: Progressing normally Preeclampsia:  labs stable Fetal Wellbeing:  Category I Pain Control:  Labor support without medications I/D:  n/a Anticipated MOD:  NSVD  Melody N Shambley 11/21/2018, 6:43 AM

## 2018-11-21 NOTE — OB Triage Note (Signed)
Pt is a G1P0 seen in observation at [redacted]w[redacted]d w/ c/o of ctx and vomiting. Pt denies vaginal bleeding and LOF. Pt states positive fetal movement. Pt reports 6/10 pain w/ ctx. Monitors applied and assessing. Will continue to monitor.

## 2018-11-21 NOTE — Lactation Note (Signed)
This note was copied from a baby's chart. Lactation Consultation Note  Patient Name: Jamie Dudley [id number] Date: 11/21/2018   [facility] spoke to mom and dad. Mom had baby in cross cradle position, in great alignment, on left breast. Reports initially had trouble sustaining latch, but with 3 attempts baby stayed latched without use of a nipple shield, and mom worked to pull down bottom lip, no pain or discomfort noted. Praised mom for her dedication to breastfeeding, and patience with learning along with Jamie Dudley.  LC helped talk dad through changing Jamie Dudley' first stool diaper. Reviewed with parents newborn stomach size, feeding patterns, and early hunger cues, transient nipple pain/soreness, care and comfort, and anticipation of growth spurts and cluster feeding. Encouraged to continue working on [name] with his bottom lip. Encouraged frequent skin to skin, tracking of feeds/attempts, and wet/stool diapers on log.  [facility] name and number written on white board, encouraged to call out with any questions/concerns.   Maternal Data    Feeding Feeding Type: Breast Fed  [facility] Score                   Interventions    Lactation Tools Discussed/Used     Consult Status      Lavonia Drafts 11/21/2018, 3:33 PM

## 2018-11-22 DIAGNOSIS — B951 Streptococcus, group B, as the cause of diseases classified elsewhere: Secondary | ICD-10-CM | POA: Diagnosis present

## 2018-11-22 LAB — CBC
HCT: 33.3 % — ABNORMAL LOW (ref 36.0–46.0)
Hemoglobin: 11 g/dL — ABNORMAL LOW (ref 12.0–15.0)
MCH: 29.9 pg (ref 26.0–34.0)
MCHC: 33 g/dL (ref 30.0–36.0)
MCV: 90.5 fL (ref 80.0–100.0)
Platelets: 176 10*3/uL (ref 150–400)
RBC: 3.68 MIL/uL — ABNORMAL LOW (ref 3.87–5.11)
RDW: 13.9 % (ref 11.5–15.5)
WBC: 16.9 10*3/uL — ABNORMAL HIGH (ref 4.0–10.5)
nRBC: 0 % (ref 0.0–0.2)

## 2018-11-22 LAB — RPR: RPR Ser Ql: NONREACTIVE

## 2018-11-22 MED ORDER — IBUPROFEN 600 MG PO TABS: 600.0000 mg | ORAL_TABLET | Freq: Four times a day (QID) | ORAL | 0 refills | Status: DC

## 2018-11-22 MED ORDER — SIMETHICONE 80 MG PO CHEW: 80.0000 mg | CHEWABLE_TABLET | ORAL | 0 refills | Status: DC | PRN

## 2018-11-22 MED ORDER — WITCH HAZEL-GLYCERIN EX PADS: 1.0000 "application " | MEDICATED_PAD | CUTANEOUS | 12 refills | Status: DC | PRN

## 2018-11-22 MED ORDER — ACETAMINOPHEN 325 MG PO TABS: 650.0000 mg | ORAL_TABLET | ORAL | 1 refills | Status: DC | PRN

## 2018-11-22 NOTE — Discharge Summary (Signed)
Pt dc'd via w/c to home. Infant placed in backseat of car by FOB

## 2018-11-22 NOTE — Progress Notes (Signed)
Patient discharged home with infant. Discharge instructions, prescriptions and follow up appointment given to and reviewed with patient. Patient verbalized understanding.   Waiting on patient to call out to be wheeled down

## 2018-11-22 NOTE — Lactation Note (Signed)
Lactation Consultation Note  Patient Name: Alaisa Dudley [id number] Date: 11/22/2018 Reason for consult: Initial assessment   Maternal Data    Feeding Feeding Type: Breast Fed  LATCH Score Latch: Grasps breast easily, tongue down, lips flanged, rhythmical sucking.  Audible Swallowing: Spontaneous and intermittent  Type of Nipple: Everted at rest and after stimulation  Comfort (Breast/Nipple): Soft / non-tender  Hold (Positioning): No assistance needed to correctly position infant at breast.  LATCH Score: 10  Interventions Interventions: Breast feeding basics reviewed       Consult Status Consult Status: PRN    Daryel November 11/22/2018, 12:22 PM

## 2018-11-22 NOTE — Discharge Summary (Signed)
Obstetric Discharge Summary  Patient ID: Jamie Dudley MRN: [id number] DOB/AGE: 1980/10/30 38 y.o.   Date of Admission: 11/21/2018  Date of Discharge:  11/22/18   Admitting Diagnosis: Onset of Labor at [redacted]w[redacted]d  Secondary Diagnosis: GBS positive, History of depression  Mode of Delivery: Normal spontaneous vaginal delivery     Discharge Diagnosis: No other diagnosis   Intrapartum Procedures: GBS prophylaxis   Post partum procedures: None  Complications: None   Brief Hospital Course   Jamie Dudley is a G1P1001 who had a SVD on 11/22/2018;  for further details of this birth, please refer to the delivey summary.  Patient had an uncomplicated postpartum course.  By time of discharge on PPD#1, her pain was controlled on oral pain medications; she had appropriate lochia and was ambulating, voiding without difficulty and tolerating regular diet.  She was deemed stable for discharge to home.    Labs: CBC Latest Ref Rng & Units 11/22/2018 11/21/2018 11/14/2018  WBC 4.0 - 10.5 K/uL 16.9(H) 12.6(H) 8.1  Hemoglobin 12.0 - 15.0 g/dL 11.0(L) 13.8 13.0  Hematocrit 36.0 - 46.0 % 33.3(L) 41.5 39.0  Platelets 150 - 400 K/uL 176 201 166   A POS  Physical exam:   BP 119/74 (BP Location: Left Arm)   Pulse 87   Temp 98 F (36.7 C) (Oral)   Resp 18   Ht 5' 5.5" (1.664 m)   Wt 72.6 kg   LMP 02/15/2018 (Exact Date)   SpO2 98%   Breastfeeding Unknown   BMI 26.22 kg/m   General: alert and no distress  Lochia: appropriate  Abdomen: soft, NT  Uterine Fundus: firm  Perineum: healing well, no significant drainage, no dehiscence, no significant erythema  Extremities: No evidence of DVT seen on physical exam. No lower extremity edema.  Discharge Instructions: Per After Visit Summary.  Activity: Advance as tolerated. Pelvic rest for 6 weeks.  Also refer to After Visit Summary  Diet: Regular  Medications: Allergies as of 11/22/2018   No Known Allergies     Medication List    TAKE these  medications   acetaminophen 325 MG tablet Commonly known as: Tylenol Take 2 tablets (650 mg total) by mouth every 4 (four) hours as needed (for pain scale < 4).   ibuprofen 600 MG tablet Commonly known as: ADVIL Take 1 tablet (600 mg total) by mouth every 6 (six) hours.   prenatal multivitamin Tabs tablet Take 1 tablet by mouth daily at 12 noon.   simethicone 80 MG chewable tablet Commonly known as: MYLICON Chew 1 tablet (80 mg total) by mouth as needed for flatulence.   witch hazel-glycerin pad Commonly known as: TUCKS Apply 1 application topically as needed for hemorrhoids.      Outpatient follow up:  Follow-up Information    [facility]. Schedule an appointment as soon as possible for a visit in 1 week(s).   Why: Please call office to schedule one (1) week postpartum blood pressure check.  Contact information: [place]  [place] [phone]       Joylene Igo, [place]. Call in 6 week(s).   Specialties: Obstetrics and Gynecology, Radiology Why: Please call and schedule six (6) week postpartum visit with [facility]. Contact information: [address] [place] [address] [phone]          Postpartum contraception: undecided, will discuss further at postpartum visit  Discharged Condition: stable  Discharged to: home   Newborn Data:  Disposition:home  with mother  Apgars: APGAR (1 MIN): 9   APGAR (5 MINS): 9     Baby Feeding: Breast   Jamie Dudley, CNM [facility], [facility] 11/22/18 1:44 PM

## 2018-11-22 NOTE — Discharge Instructions (Signed)
Breastfeeding ° °Choosing to breastfeed is one of the best decisions you can make for yourself and your baby. A change in hormones during pregnancy causes your breasts to make breast milk in your milk-producing glands. Hormones prevent breast milk from being released before your baby is born. They also prompt milk flow after birth. Once breastfeeding has begun, thoughts of your baby, as well as his or her sucking or crying, can stimulate the release of milk from your milk-producing glands. °Benefits of breastfeeding °Research shows that breastfeeding offers many health benefits for infants and mothers. It also offers a cost-free and convenient way to feed your baby. °For your baby °· Your first milk (colostrum) helps your baby's digestive system to function better. °· Special cells in your milk (antibodies) help your baby to fight off infections. °· Breastfed babies are less likely to develop asthma, allergies, obesity, or type 2 diabetes. They are also at lower risk for sudden infant death syndrome (SIDS). °· Nutrients in breast milk are better able to meet your baby’s needs compared to infant formula. °· Breast milk improves your baby's brain development. °For you °· Breastfeeding helps to create a very special bond between you and your baby. °· Breastfeeding is convenient. Breast milk costs nothing and is always available at the correct temperature. °· Breastfeeding helps to burn calories. It helps you to lose the weight that you gained during pregnancy. °· Breastfeeding makes your uterus return faster to its size before pregnancy. It also slows bleeding (lochia) after you give birth. °· Breastfeeding helps to lower your risk of developing type 2 diabetes, osteoporosis, rheumatoid arthritis, cardiovascular disease, and breast, ovarian, uterine, and endometrial cancer later in life. °Breastfeeding basics °Starting breastfeeding °· Find a comfortable place to sit or lie down, with your neck and back  well-supported. °· Place a pillow or a rolled-up blanket under your baby to bring him or her to the level of your breast (if you are seated). Nursing pillows are specially designed to help support your arms and your baby while you breastfeed. °· Make sure that your baby's tummy (abdomen) is facing your abdomen. °· Gently massage your breast. With your fingertips, massage from the outer edges of your breast inward toward the nipple. This encourages milk flow. If your milk flows slowly, you may need to continue this action during the feeding. °· Support your breast with 4 fingers underneath and your thumb above your nipple (make the letter "C" with your hand). Make sure your fingers are well away from your nipple and your baby’s mouth. °· Stroke your baby's lips gently with your finger or nipple. °· When your baby's mouth is open wide enough, quickly bring your baby to your breast, placing your entire nipple and as much of the areola as possible into your baby's mouth. The areola is the colored area around your nipple. °? More areola should be visible above your baby's upper lip than below the lower lip. °? Your baby's lips should be opened and extended outward (flanged) to ensure an adequate, comfortable latch. °? Your baby's tongue should be between his or her lower gum and your breast. °· Make sure that your baby's mouth is correctly positioned around your nipple (latched). Your baby's lips should create a seal on your breast and be turned out (everted). °· It is common for your baby to suck about 2-3 minutes in order to start the flow of breast milk. °Latching °Teaching your baby how to latch onto your breast properly is   very important. An improper latch can cause nipple pain, decreased milk supply, and poor weight gain in your baby. Also, if your baby is not latched onto your nipple properly, he or she may swallow some air during feeding. This can make your baby fussy. Burping your baby when you switch breasts  during the feeding can help to get rid of the air. However, teaching your baby to latch on properly is still the best way to prevent fussiness from swallowing air while breastfeeding. °Signs that your baby has successfully latched onto your nipple °· Silent tugging or silent sucking, without causing you pain. Infant's lips should be extended outward (flanged). °· Swallowing heard between every 3-4 sucks once your milk has started to flow (after your let-down milk reflex occurs). °· Muscle movement above and in front of his or her ears while sucking. °Signs that your baby has not successfully latched onto your nipple °· Sucking sounds or smacking sounds from your baby while breastfeeding. °· Nipple pain. °If you think your baby has not latched on correctly, slip your finger into the corner of your baby’s mouth to break the suction and place it between your baby's gums. Attempt to start breastfeeding again. °Signs of successful breastfeeding °Signs from your baby °· Your baby will gradually decrease the number of sucks or will completely stop sucking. °· Your baby will fall asleep. °· Your baby's body will relax. °· Your baby will retain a small amount of milk in his or her mouth. °· Your baby will let go of your breast by himself or herself. °Signs from you °· Breasts that have increased in firmness, weight, and size 1-3 hours after feeding. °· Breasts that are softer immediately after breastfeeding. °· Increased milk volume, as well as a change in milk consistency and color by the fifth day of breastfeeding. °· Nipples that are not sore, cracked, or bleeding. °Signs that your baby is getting enough milk °· Wetting at least 1-2 diapers during the first 24 hours after birth. °· Wetting at least 5-6 diapers every 24 hours for the first week after birth. The urine should be clear or pale yellow by the age of 5 days. °· Wetting 6-8 diapers every 24 hours as your baby continues to grow and develop. °· At least 3 stools in  a 24-hour period by the age of 5 days. The stool should be soft and yellow. °· At least 3 stools in a 24-hour period by the age of 7 days. The stool should be seedy and yellow. °· No loss of weight greater than 10% of birth weight during the first 3 days of life. °· Average weight gain of 4-7 oz (113-198 g) per week after the age of 4 days. °· Consistent daily weight gain by the age of 5 days, without weight loss after the age of 2 weeks. °After a feeding, your baby may spit up a small amount of milk. This is normal. °Breastfeeding frequency and duration °Frequent feeding will help you make more milk and can prevent sore nipples and extremely full breasts (breast engorgement). Breastfeed when you feel the need to reduce the fullness of your breasts or when your baby shows signs of hunger. This is called "breastfeeding on demand." Signs that your baby is hungry include: °· Increased alertness, activity, or restlessness. °· Movement of the head from side to side. °· Opening of the mouth when the corner of the mouth or cheek is stroked (rooting). °· Increased sucking sounds, smacking lips, cooing,   sighing, or squeaking. °· Hand-to-mouth movements and sucking on fingers or hands. °· Fussing or crying. °Avoid introducing a pacifier to your baby in the first 4-6 weeks after your baby is born. After this time, you may choose to use a pacifier. Research has shown that pacifier use during the first year of a baby's life decreases the risk of sudden infant death syndrome (SIDS). °Allow your baby to feed on each breast as long as he or she wants. When your baby unlatches or falls asleep while feeding from the first breast, offer the second breast. Because newborns are often sleepy in the first few weeks of life, you may need to awaken your baby to get him or her to feed. °Breastfeeding times will vary from baby to baby. However, the following rules can serve as a guide to help you make sure that your baby is properly  fed: °· Newborns (babies 4 weeks of age or younger) may breastfeed every 1-3 hours. °· Newborns should not go without breastfeeding for longer than 3 hours during the day or 5 hours during the night. °· You should breastfeed your baby a minimum of 8 times in a 24-hour period. °Breast milk pumping ° °  ° °Pumping and storing breast milk allows you to make sure that your baby is exclusively fed your breast milk, even at times when you are unable to breastfeed. This is especially important if you go back to work while you are still breastfeeding, or if you are not able to be present during feedings. Your lactation consultant can help you find a method of pumping that works best for you and give you guidelines about how long it is safe to store breast milk. °Caring for your breasts while you breastfeed °Nipples can become dry, cracked, and sore while breastfeeding. The following recommendations can help keep your breasts moisturized and healthy: °· Avoid using soap on your nipples. °· Wear a supportive bra designed especially for nursing. Avoid wearing underwire-style bras or extremely tight bras (sports bras). °· Air-dry your nipples for 3-4 minutes after each feeding. °· Use only cotton bra pads to absorb leaked breast milk. Leaking of breast milk between feedings is normal. °· Use lanolin on your nipples after breastfeeding. Lanolin helps to maintain your skin's normal moisture barrier. Pure lanolin is not harmful (not toxic) to your baby. You may also hand express a few drops of breast milk and gently massage that milk into your nipples and allow the milk to air-dry. °In the first few weeks after giving birth, some women experience breast engorgement. Engorgement can make your breasts feel heavy, warm, and tender to the touch. Engorgement peaks within 3-5 days after you give birth. The following recommendations can help to ease engorgement: °· Completely empty your breasts while breastfeeding or pumping. You may  want to start by applying warm, moist heat (in the shower or with warm, water-soaked hand towels) just before feeding or pumping. This increases circulation and helps the milk flow. If your baby does not completely empty your breasts while breastfeeding, pump any extra milk after he or she is finished. °· Apply ice packs to your breasts immediately after breastfeeding or pumping, unless this is too uncomfortable for you. To do this: °? Put ice in a plastic bag. °? Place a towel between your skin and the bag. °? Leave the ice on for 20 minutes, 2-3 times a day. °· Make sure that your baby is latched on and positioned properly while breastfeeding. °If   engorgement persists after 48 hours of following these recommendations, contact your health care provider or a lactation consultant. °Overall health care recommendations while breastfeeding °· Eat 3 healthy meals and 3 snacks every day. Well-nourished mothers who are breastfeeding need an additional 450-500 calories a day. You can meet this requirement by increasing the amount of a balanced diet that you eat. °· Drink enough water to keep your urine pale yellow or clear. °· Rest often, relax, and continue to take your prenatal vitamins to prevent fatigue, stress, and low vitamin and mineral levels in your body (nutrient deficiencies). °· Do not use any products that contain nicotine or tobacco, such as cigarettes and e-cigarettes. Your baby may be harmed by chemicals from cigarettes that pass into breast milk and exposure to secondhand smoke. If you need help quitting, ask your health care provider. °· Avoid alcohol. °· Do not use illegal drugs or marijuana. °· Talk with your health care provider before taking any medicines. These include over-the-counter and prescription medicines as well as vitamins and herbal supplements. Some medicines that may be harmful to your baby can pass through breast milk. °· It is possible to become pregnant while breastfeeding. If birth  control is desired, ask your health care provider about options that will be safe while breastfeeding your baby. °Where to find more information: °[employer]: www.llli.org °Contact a health care provider if: °· You feel like you want to stop breastfeeding or have become frustrated with breastfeeding. °· Your nipples are cracked or bleeding. °· Your breasts are red, tender, or warm. °· You have: °? Painful breasts or nipples. °? A swollen area on either breast. °? A fever or chills. °? Nausea or vomiting. °? Drainage other than breast milk from your nipples. °· Your breasts do not become full before feedings by the fifth day after you give birth. °· You feel sad and depressed. °· Your baby is: °? Too sleepy to eat well. °? Having trouble sleeping. °? More than 1 week old and wetting fewer than 6 diapers in a 24-hour period. °? Not gaining weight by 5 days of age. °· Your baby has fewer than 3 stools in a 24-hour period. °· Your baby's skin or the white parts of his or her eyes become yellow. °Get help right away if: °· Your baby is overly tired (lethargic) and does not want to wake up and feed. °· Your baby develops an unexplained fever. °Summary °· Breastfeeding offers many health benefits for infant and mothers. °· Try to breastfeed your infant when he or she shows early signs of hunger. °· Gently tickle or stroke your baby's lips with your finger or nipple to allow the baby to open his or her mouth. Bring the baby to your breast. Make sure that much of the areola is in your baby's mouth. Offer one side and burp the baby before you offer the other side. °· Talk with your health care provider or lactation consultant if you have questions or you face problems as you breastfeed. °This information is not intended to replace advice given to you by your health care provider. Make sure you discuss any questions you have with your health care provider. °Document Released: 02/28/2005 Document Revised:  05/25/2017 Document Reviewed: 04/01/2016 °[employer] Patient Education © 2020 [employer] °Postpartum Care After Vaginal Delivery °This sheet gives you information about how to care for yourself from the time you deliver your baby to up to 6-12 weeks after delivery (postpartum period). Your   health care provider may also give you more specific instructions. If you have problems or questions, contact your health care provider. °Follow these instructions at home: °Vaginal bleeding °· It is normal to have vaginal bleeding (lochia) after delivery. Wear a sanitary pad for vaginal bleeding and discharge. °? During the first week after delivery, the amount and appearance of lochia is often similar to a menstrual period. °? Over the next few weeks, it will gradually decrease to a dry, yellow-brown discharge. °? For most women, lochia stops completely by 4-6 weeks after delivery. Vaginal bleeding can vary from woman to woman. °· Change your sanitary pads frequently. Watch for any changes in your flow, such as: °? A sudden increase in volume. °? A change in color. °? Large blood clots. °· If you pass a blood clot from your vagina, save it and call your health care provider to discuss. Do not flush blood clots down the toilet before talking with your health care provider. °· Do not use tampons or douches until your health care provider says this is safe. °· If you are not breastfeeding, your period should return 6-8 weeks after delivery. If you are feeding your child breast milk only (exclusive breastfeeding), your period may not return until you stop breastfeeding. °Perineal care °· Keep the area between the vagina and the anus (perineum) clean and dry as told by your health care provider. Use medicated pads and pain-relieving sprays and creams as directed. °· If you had a cut in the perineum (episiotomy) or a tear in the vagina, check the area for signs of infection until you are healed. Check for: °? More redness, swelling,  or pain. °? Fluid or blood coming from the cut or tear. °? Warmth. °? Pus or a bad smell. °· You may be given a squirt bottle to use instead of wiping to clean the perineum area after you go to the bathroom. As you start healing, you may use the squirt bottle before wiping yourself. Make sure to wipe gently. °· To relieve pain caused by an episiotomy, a tear in the vagina, or swollen veins in the anus (hemorrhoids), try taking a warm sitz bath 2-3 times a day. A sitz bath is a warm water bath that is taken while you are sitting down. The water should only come up to your hips and should cover your buttocks. °Breast care °· Within the first few days after delivery, your breasts may feel heavy, full, and uncomfortable (breast engorgement). Milk may also leak from your breasts. Your health care provider can suggest ways to help relieve the discomfort. Breast engorgement should go away within a few days. °· If you are breastfeeding: °? Wear a bra that supports your breasts and fits you well. °? Keep your nipples clean and dry. Apply creams and ointments as told by your health care provider. °? You may need to use breast pads to absorb milk that leaks from your breasts. °? You may have uterine contractions every time you breastfeed for up to several weeks after delivery. Uterine contractions help your uterus return to its normal size. °? If you have any problems with breastfeeding, work with your health care provider or lactation consultant. °· If you are not breastfeeding: °? Avoid touching your breasts a lot. Doing this can make your breasts produce more milk. °? Wear a good-fitting bra and use cold packs to help with swelling. °? Do not squeeze out (express) milk. This causes you to make more milk. °Intimacy   and sexuality °· Ask your health care provider when you can engage in sexual activity. This may depend on: °? Your risk of infection. °? How fast you are healing. °? Your comfort and desire to engage in sexual  activity. °· You are able to get pregnant after delivery, even if you have not had your period. If desired, talk with your health care provider about methods of birth control (contraception). °Medicines °· Take over-the-counter and prescription medicines only as told by your health care provider. °· If you were prescribed an antibiotic medicine, take it as told by your health care provider. Do not stop taking the antibiotic even if you start to feel better. °Activity °· Gradually return to your normal activities as told by your health care provider. Ask your health care provider what activities are safe for you. °· Rest as much as possible. Try to rest or take a nap while your baby is sleeping. °Eating and drinking ° °· Drink enough fluid to keep your urine pale yellow. °· Eat high-fiber foods every day. These may help prevent or relieve constipation. High-fiber foods include: °? Whole grain cereals and breads. °? Brown rice. °? Beans. °? Fresh fruits and vegetables. °· Do not try to lose weight quickly by cutting back on calories. °· Take your prenatal vitamins until your postpartum checkup or until your health care provider tells you it is okay to stop. °Lifestyle °· Do not use any products that contain nicotine or tobacco, such as cigarettes and e-cigarettes. If you need help quitting, ask your health care provider. °· Do not drink alcohol, especially if you are breastfeeding. °General instructions °· Keep all follow-up visits for you and your baby as told by your health care provider. Most women visit their health care provider for a postpartum checkup within the first 3-6 weeks after delivery. °Contact a health care provider if: °· You feel unable to cope with the changes that your child brings to your life, and these feelings do not go away. °· You feel unusually sad or worried. °· Your breasts become red, painful, or hard. °· You have a fever. °· You have trouble holding urine or keeping urine from  leaking. °· You have little or no interest in activities you used to enjoy. °· You have not breastfed at all and you have not had a menstrual period for 12 weeks after delivery. °· You have stopped breastfeeding and you have not had a menstrual period for 12 weeks after you stopped breastfeeding. °· You have questions about caring for yourself or your baby. °· You pass a blood clot from your vagina. °Get help right away if: °· You have chest pain. °· You have difficulty breathing. °· You have sudden, severe leg pain. °· You have severe pain or cramping in your lower abdomen. °· You bleed from your vagina so much that you fill more than one sanitary pad in one hour. Bleeding should not be heavier than your heaviest period. °· You develop a severe headache. °· You faint. °· You have blurred vision or spots in your vision. °· You have bad-smelling vaginal discharge. °· You have thoughts about hurting yourself or your baby. °If you ever feel like you may hurt yourself or others, or have thoughts about taking your own life, get help right away. You can go to the nearest emergency department or call: °· Your local emergency services (911 in the [place] °· A suicide crisis helpline, such as the National Suicide Prevention Lifeline   at 1-800-273-8255. This is open 24 hours a day. °Summary °· The period of time right after you deliver your newborn up to 6-12 weeks after delivery is called the postpartum period. °· Gradually return to your normal activities as told by your health care provider. °· Keep all follow-up visits for you and your baby as told by your health care provider. °This information is not intended to replace advice given to you by your health care provider. Make sure you discuss any questions you have with your health care provider. °Document Released: 12/26/2006 Document Revised: 03/03/2017 Document Reviewed: 12/12/2016 °[employer] Patient Education © 2020 [employer] °Postpartum Baby Blues °The postpartum  period begins right after the birth of a baby. During this time, there is often a lot of joy and excitement. It is also a time of many changes in the life of the parents. No matter how many times a mother gives birth, each child brings new challenges to the family, including different ways of relating to one another. °It is common to have feelings of excitement along with confusing changes in moods, emotions, and thoughts. You may feel happy one minute and sad or stressed the next. These feelings of sadness usually happen in the period right after you have your baby, and they go away within a week or two. This is called the "baby blues." °What are the causes? °There is no known cause of baby blues. It is likely caused by a combination of factors. However, changes in hormone levels after childbirth are believed to trigger some of the symptoms. °Other factors that can play a role in these mood changes include: °· Lack of sleep. °· Stressful life events, such as poverty, caring for a loved one, or death of a loved one. °· Genetics. °What are the signs or symptoms? °Symptoms of this condition include: °· Brief changes in mood, such as going from extreme happiness to sadness. °· Decreased concentration. °· Difficulty sleeping. °· Crying spells and tearfulness. °· Loss of appetite. °· Irritability. °· Anxiety. °If the symptoms of baby blues last for more than 2 weeks or become more severe, you may have postpartum depression. °How is this diagnosed? °This condition is diagnosed based on an evaluation of your symptoms. There are no medical or lab tests that lead to a diagnosis, but there are various questionnaires that a health care provider may use to identify women with the baby blues or postpartum depression. °How is this treated? °Treatment is not needed for this condition. The baby blues usually go away on their own in 1-2 weeks. Social support is often all that is needed. You will be encouraged to get adequate sleep  and rest. °Follow these instructions at home: °Lifestyle ° °  ° °· Get as much rest as you can. Take a nap when the baby sleeps. °· Exercise regularly as told by your health care provider. Some women find yoga and walking to be helpful. °· Eat a balanced and nourishing diet. This includes plenty of fruits and vegetables, whole grains, and lean proteins. °· Do little things that you enjoy. Have a cup of tea, take a bubble bath, read your favorite magazine, or listen to your favorite music. °· Avoid alcohol. °· Ask for help with household chores, cooking, grocery shopping, or running errands. Do not try to do everything yourself. Consider hiring a postpartum doula to help. This is a professional who specializes in providing support to new mothers. °· Try not to make any major life changes   during pregnancy or right after giving birth. This can add stress. °General instructions °· Talk to people close to you about how you are feeling. Get support from your partner, family members, friends, or other new moms. You may want to join a support group. °· Find ways to cope with stress. This may include: °? Writing your thoughts and feelings in a journal. °? Spending time outside. °? Spending time with people who make you laugh. °· Try to stay positive in how you think. Think about the things you are grateful for. °· Take over-the-counter and prescription medicines only as told by your health care provider. °· Let your health care provider know if you have any concerns. °· Keep all postpartum visits as told by your health care provider. This is important. °Contact a health care provider if: °· Your baby blues do not go away after 2 weeks. °Get help right away if: °· You have thoughts of taking your own life (suicidal thoughts). °· You think you may harm the baby or other people. °· You see or hear things that are not there (hallucinations). °Summary °· After giving birth, you may feel happy one minute and sad or stressed the  next. Feelings of sadness that happen right after the baby is born and go away after a week or two are called the "baby blues." °· You can manage the baby blues by getting enough rest, eating a healthy diet, exercising, spending time with supportive people, and finding ways to cope with stress. °· If feelings of sadness and stress last longer than 2 weeks or get in the way of caring for your baby, talk to your health care provider. This may mean you have postpartum depression. °This information is not intended to replace advice given to you by your health care provider. Make sure you discuss any questions you have with your health care provider. °Document Released: 12/03/2003 Document Revised: 06/22/2018 Document Reviewed: 04/26/2016 °Elsevier Patient Education © 2020 [employer] ° °

## 2018-11-29 ENCOUNTER — Ambulatory Visit (INDEPENDENT_AMBULATORY_CARE_PROVIDER_SITE_OTHER)
Admission: RE | Admit: 2018-11-29 | Discharge: 2018-11-29 | Disposition: A | Discharge status: Home / Self Care | Payer: BLUE CROSS/BLUE SHIELD | Source: Ambulatory Visit

## 2018-11-29 DIAGNOSIS — G43009 Migraine without aura, not intractable, without status migrainosus: Secondary | ICD-10-CM | POA: Diagnosis not present

## 2018-11-29 MED ORDER — EXCEDRIN MIGRAINE 250-250-65 MG PO TABS: 2.0000 | ORAL_TABLET | Freq: Three times a day (TID) | ORAL | 0 refills | Status: DC | PRN

## 2018-11-29 NOTE — ED Provider Notes (Signed)
[facility] Virtual Visit via Video Note:  Jamie Dudley  initiated request for Telemedicine visit with [facility] team. I connected with Jamie Dudley  on 11/29/2018 at 1:48 PM  for a synchronized telemedicine visit using a video enabled HIPPA compliant telemedicine application. I verified that I am speaking with Jamie Dudley  using two identifiers. [name], PA-C  was physically located in a [facility]gent care site and [name] was located at a different location.   The limitations of evaluation and management by telemedicine as well as the availability of in-person appointments were discussed. Patient was informed that she  may incur a bill ( including co-pay) for this virtual visit encounter. [name]  expressed understanding and gave verbal consent to proceed with virtual visit.   [id number] 11/29/18 Arrival Time: 1331  CC: Headache  SUBJECTIVE: History from: patient.  Jamie Dudley is a 38 y.o. female who complains of migraine x 2 days.  Denies a precipitating event, or recent head trauma.  Patient localizes her pain to the left temple area.  Describes the pain as constant and dull/ achy in character.  Patient has tried OTC ibupforen without relief. Denies aggravating factors. Reports similar symptoms in the past.  This is not the worst headache of their life. Complains of mild tension in shoulders. Patient denies fever, chills, neck pain/ stiffness, rhinorrhea, congestion, sore throat, cough, blurred vision, vision changes, photophobia, nausea, vomiting, weakness in arms or legs, slurred speech, facial asymmetries, numbness or tingling.    Had baby 1 week ago.  Is currently breast-feeding  ROS: As per HPI.  All other pertinent ROS negative.     Past Medical History:  Diagnosis Date  . Atypical squamous cells of undetermined significance (ASC-US) on cervical Pap smear   . Depression   . Sinusitis   . Skin-picking disorder    Past Surgical History:  Procedure  Laterality Date  . COLPOSCOPY    . WISDOM TOOTH EXTRACTION     No Known Allergies No current facility-administered medications on file prior to encounter.    Current Outpatient Medications on File Prior to Encounter  Medication Sig Dispense Refill  . acetaminophen (TYLENOL) 325 MG tablet Take 2 tablets (650 mg total) by mouth every 4 (four) hours as needed (for pain scale < 4). 60 tablet 1  . ibuprofen (ADVIL) 600 MG tablet Take 1 tablet (600 mg total) by mouth every 6 (six) hours. 30 tablet 0  . Prenatal Vit-Fe Fumarate-FA (PRENATAL MULTIVITAMIN) TABS tablet Take 1 tablet by mouth daily at 12 noon.    . simethicone (MYLICON) 80 MG chewable tablet Chew 1 tablet (80 mg total) by mouth as needed for flatulence. 30 tablet 0  . witch hazel-glycerin (TUCKS) pad Apply 1 application topically as needed for hemorrhoids. 40 each 12    OBJECTIVE:   There were no vitals filed for this visit.  General appearance: alert; appears mildly fatigued, but nontoxic Eyes: EOMI grossly HENT: normocephalic; atraumatic Neck: supple with FROM Lungs: normal respiratory effort; speaking in full sentences without difficulty Extremities: moves extremities without difficulty Skin: No obvious rashes Neurologic: No facial asymmetries Psychological: alert and cooperative; normal mood and affect  ASSESSMENT & PLAN:  1. Migraine without aura and without status migrainosus, not intractable     Meds ordered this encounter  Medications  . aspirin-acetaminophen-caffeine (EXCEDRIN MIGRAINE) 250-250-65 MG tablet    Sig: Take 2 tablets by mouth every 8 (eight) hours as needed for headache.  Dispense:  30 tablet    Refill:  0    Order Specific Question:   Supervising Provider    Answer:   Eustace MooreELSON, YVONNE SUE [[id number]]   Rest and push fluids Excedrin migraine prescribed.  Take as directed.  This medication may not be safe in pregnancy.   [facility] if symptoms persist Follow up in  person or go to the ED if you have any new or worsening symptoms such as fever, chills, nausea, vomiting, vision changes, slurred speech, facial asymmetries, weakness in arms or legs, chest pain, shortness of breath, abdominal pain, 10/10 headache, worst headache of your life, symptoms do not improve with medications, etc...  I discussed the assessment and treatment plan with the patient. The patient was provided an opportunity to ask questions and all were answered. The patient agreed with the plan and demonstrated an understanding of the instructions.   The patient was advised to call back or seek an in-person evaluation if the symptoms worsen or if the condition fails to improve as anticipated.  I provided 13 minutes of non-face-to-face time during this encounter.  [name], PA-C  11/29/2018 1:48 PM          [name], [name], PA-C 11/29/18 1348

## 2018-11-29 NOTE — Discharge Instructions (Signed)
Rest and push fluids Excedrin migraine prescribed.  Take as directed.  This medication may not be safe in pregnancy.   Follow up in person at [facility] if symptoms persist Follow up in person or go to the ED if you have any new or worsening symptoms such as fever, chills, nausea, vomiting, vision changes, slurred speech, facial asymmetries, weakness in arms or legs, chest pain, shortness of breath, abdominal pain, 10/10 headache, worst headache of your life, symptoms do not improve with medications, etc..Marland Kitchen

## 2018-11-30 ENCOUNTER — Encounter: Payer: BLUE CROSS/BLUE SHIELD | Admitting: Certified Nurse Midwife

## 2018-12-01 ENCOUNTER — Encounter: Payer: Self-pay | Admitting: Family Medicine

## 2018-12-03 ENCOUNTER — Other Ambulatory Visit: Payer: Self-pay

## 2018-12-03 ENCOUNTER — Ambulatory Visit (INDEPENDENT_AMBULATORY_CARE_PROVIDER_SITE_OTHER): Payer: BLUE CROSS/BLUE SHIELD | Admitting: Certified Nurse Midwife

## 2018-12-03 ENCOUNTER — Encounter: Payer: Self-pay | Admitting: Certified Nurse Midwife

## 2018-12-03 VITALS — BP 136/88 | HR 69 | Ht 65.0 in | Wt 147.3 lb

## 2018-12-03 DIAGNOSIS — R51 Headache: Secondary | ICD-10-CM | POA: Diagnosis not present

## 2018-12-03 DIAGNOSIS — Z0130 Encounter for examination of blood pressure without abnormal findings: Secondary | ICD-10-CM

## 2018-12-03 DIAGNOSIS — O9089 Other complications of the puerperium, not elsewhere classified: Secondary | ICD-10-CM

## 2018-12-03 DIAGNOSIS — L309 Dermatitis, unspecified: Secondary | ICD-10-CM

## 2018-12-03 DIAGNOSIS — Z1331 Encounter for screening for depression: Secondary | ICD-10-CM

## 2018-12-03 DIAGNOSIS — R519 Headache, unspecified: Secondary | ICD-10-CM

## 2018-12-03 MED ORDER — TRIAMCINOLONE ACETONIDE 0.1 % EX CREA: 1.0000 "application " | TOPICAL_CREAM | Freq: Two times a day (BID) | CUTANEOUS | 0 refills | Status: DC

## 2018-12-03 MED ORDER — MAGNESIUM OXIDE -MG SUPPLEMENT 400 (240 MG) MG PO TABS: 1.0000 | ORAL_TABLET | Freq: Two times a day (BID) | ORAL | 1 refills | Status: DC

## 2018-12-03 MED ORDER — HYDROCHLOROTHIAZIDE 12.5 MG PO TABS: 12.5000 mg | ORAL_TABLET | Freq: Every day | ORAL | 0 refills | Status: DC

## 2018-12-03 NOTE — Progress Notes (Signed)
Patient here for blood pressure check.  Patient c/o left sided headache x4 days, taking Ibuprofen and increased water intake with very little relief, no visual changes.

## 2018-12-03 NOTE — Progress Notes (Unsigned)
Triamcinolone cream sent in

## 2018-12-03 NOTE — Progress Notes (Signed)
GYN ENCOUNTER NOTE  Subjective:       Jamie Dudley is a 38 y.o. G18P1001 female is here for evaluation of the following issues:  1. Dull left sided headache since Tuesday night. Worsen pain on Wednesday. Mild relief with Motrin and increased hydration. Virtual visit on Thursday. Last dose of Motrin on Friday.   No blurred vision, epigastric pain, or weight gain.   Denies difficulty breathing or respiratory distress, chest pain, abdominal pain, excessive vaginal bleeding, dysuria, and leg pain or swelling.    Gynecologic History  No LMP recorded.   Contraception: abstinence   Last Pap: 08/2016. Results were: Negative/Negative  Obstetric History  OB History  Gravida Para Term Preterm AB Living  1 1 1  0 0 1  SAB TAB Ectopic Multiple Live Births  0 0 0 0 1    # Outcome Date GA Lbr Len/2nd Weight Sex Delivery Anes PTL Lv  1 Term 11/21/18 [redacted]w[redacted]d / 01:09 7 lb 7.9 oz (3.4 kg) M Vag-Spont None  LIV    Past Medical History:  Diagnosis Date  . Atypical squamous cells of undetermined significance (ASC-US) on cervical Pap smear   . Depression   . Sinusitis   . Skin-picking disorder     Past Surgical History:  Procedure Laterality Date  . COLPOSCOPY    . WISDOM TOOTH EXTRACTION      Current Outpatient Medications on File Prior to Visit  Medication Sig Dispense Refill  . acetaminophen (TYLENOL) 325 MG tablet Take 2 tablets (650 mg total) by mouth every 4 (four) hours as needed (for pain scale < 4). 60 tablet 1  . ibuprofen (ADVIL) 600 MG tablet Take 1 tablet (600 mg total) by mouth every 6 (six) hours. 30 tablet 0  . Prenatal Vit-Fe Fumarate-FA (PRENATAL MULTIVITAMIN) TABS tablet Take 1 tablet by mouth daily at 12 noon.    Marland Kitchen witch hazel-glycerin (TUCKS) pad Apply 1 application topically as needed for hemorrhoids. 40 each 12   No current facility-administered medications on file prior to visit.     No Known Allergies  Social History   Socioeconomic History  . Marital status:  Single    Spouse name: Not on file  . Number of children: Not on file  . Years of education: Not on file  . Highest education level: Not on file  Occupational History  . Not on file  Social Needs  . Financial resource strain: Not on file  . Food insecurity    Worry: Not on file    Inability: Not on file  . Transportation needs    Medical: Not on file    Non-medical: Not on file  Tobacco Use  . Smoking status: Never Smoker  . Smokeless tobacco: Never Used  Substance and Sexual Activity  . Alcohol use: Not Currently    Comment: rare  . Drug use: No  . Sexual activity: Not Currently    Birth control/protection: None  Lifestyle  . Physical activity    Days per week: Not on file    Minutes per session: Not on file  . Stress: Not on file  Relationships  . Social Herbalist on phone: Not on file    Gets together: Not on file    Attends religious service: Not on file    Active member of club or organization: Not on file    Attends meetings of clubs or organizations: Not on file    Relationship status: Not on file  . Intimate  partner violence    Fear of current or ex partner: Not on file    Emotionally abused: Not on file    Physically abused: Not on file    Forced sexual activity: Not on file  Other Topics Concern  . Not on file  Social History Narrative  . Not on file    Family History  Problem Relation Age of Onset  . Heart disease Paternal Grandfather   . Hypertension Mother   . Breast cancer Neg Hx   . Ovarian cancer Neg Hx   . Colon cancer Neg Hx     The following portions of the patient's history were reviewed and updated as appropriate: allergies, current medications, past family history, past medical history, past social history, past surgical history and problem list.  Review of Systems  ROS negative except as noted above. Information obtained from patient.   Objective:   BP 136/88   Pulse 69   Ht 5\' 5"  (1.651 m)   Wt 147 lb 4.8 oz (66.8  kg)   Breastfeeding Yes   BMI 24.51 kg/m    CONSTITUTIONAL: Well-developed, well-nourished female in no acute distress.   MUSCULOSKELETAL: Normal range of motion. No tenderness.  No cyanosis or clubbing. Generalized lower extremity swelling.   Edinburgh Postnatal Depression Scale Screening Tool 12/03/2018 11/21/2018  I have been able to laugh and see the funny side of things. 0 0  I have looked forward with enjoyment to things. 0 0  I have blamed myself unnecessarily when things went wrong. 1 1  I have been anxious or worried for no good reason. 0 0  I have felt scared or panicky for no good reason. 0 0  Things have been getting on top of me. 0 1  I have been so unhappy that I have had difficulty sleeping. 0 0  I have felt sad or miserable. 0 0  I have been so unhappy that I have been crying. 0 0  The thought of harming myself has occurred to me. 0 0  Edinburgh Postnatal Depression Scale Total 1 2    GAD 7 : Generalized Anxiety Score 12/03/2018  Nervous, Anxious, on Edge 0  Control/stop worrying 0  Worry too much - different things 0  Trouble relaxing 0  Restless 1  Easily annoyed or irritable 0  Afraid - awful might happen 0  Total GAD 7 Score 1  Anxiety Difficulty Not difficult at all    Depression screen [facility] 2/9 12/03/2018 10/19/2018 09/19/2018 09/03/2018 06/20/2018  Decreased Interest 0 0 0 0 0  Down, Depressed, Hopeless 0 1 0 0 1  PHQ - 2 Score 0 1 0 0 1  Altered sleeping 0 0 0 0 0  Tired, decreased energy 1 0 0 0 0  Change in appetite 0 0 0 0 0  Feeling bad or failure about yourself  0 1 0 0 0  Trouble concentrating 1 1 1  0 0  Moving slowly or fidgety/restless 0 1 1 0 0  Suicidal thoughts 0 0 0 0 0  PHQ-9 Score 2 4 2  0 1  Difficult doing work/chores Not difficult at all - - - Not difficult at all    Assessment:   1. Blood pressure check   2. Postpartum headache   3. Depression screen   Plan:   Labs: See orders  Rx: Magnesium and HCTZ, see orders  Reviewed  red flag symptoms and when to call  RTC x 1 week for blood pressure check  or sooner if needed   Gunnar Bulla, CNM [facility], [facility] 12/03/18 11:37 AM

## 2018-12-03 NOTE — Patient Instructions (Addendum)
Migraine Headache A migraine headache is a very strong throbbing pain on one side or both sides of your head. This type of headache can also cause other symptoms. It can last from 4 hours to 3 days. Talk with your doctor about what things may bring on (trigger) this condition. What are the causes? The exact cause of this condition is not known. This condition may be triggered or caused by:  Drinking alcohol.  Smoking.  Taking medicines, such as: ? Medicine used to treat chest pain (nitroglycerin). ? Birth control pills. ? Estrogen. ? Some blood pressure medicines.  Eating or drinking certain products.  Doing physical activity. Other things that may trigger a migraine headache include:  Having a menstrual period.  Pregnancy.  Hunger.  Stress.  Not getting enough sleep or getting too much sleep.  Weather changes.  Tiredness (fatigue). What increases the risk?  Being 25-55 years old.  Being female.  Having a family history of migraine headaches.  Being Caucasian.  Having depression or anxiety.  Being very overweight. What are the signs or symptoms?  A throbbing pain. This pain may: ? Happen in any area of the head, such as on one side or both sides. ? Make it hard to do daily activities. ? Get worse with physical activity. ? Get worse around bright lights or loud noises.  Other symptoms may include: ? Feeling sick to your stomach (nauseous). ? Vomiting. ? Dizziness. ? Being sensitive to bright lights, loud noises, or smells.  Before you get a migraine headache, you may get warning signs (an aura). An aura may include: ? Seeing flashing lights or having blind spots. ? Seeing bright spots, halos, or zigzag lines. ? Having tunnel vision or blurred vision. ? Having numbness or a tingling feeling. ? Having trouble talking. ? Having weak muscles.  Some people have symptoms after a migraine headache (postdromal phase), such as: ? Tiredness. ? Trouble  thinking (concentrating). How is this treated?  Taking medicines that: ? Relieve pain. ? Relieve the feeling of being sick to your stomach. ? Prevent migraine headaches.  Treatment may also include: ? Having acupuncture. ? Avoiding foods that bring on migraine headaches. ? Learning ways to control your body functions (biofeedback). ? Therapy to help you know and deal with negative thoughts (cognitive behavioral therapy). Follow these instructions at home: Medicines  Take over-the-counter and prescription medicines only as told by your doctor.  Ask your doctor if the medicine prescribed to you: ? Requires you to avoid driving or using heavy machinery. ? Can cause trouble pooping (constipation). You may need to take these steps to prevent or treat trouble pooping:  Drink enough fluid to keep your pee (urine) pale yellow.  Take over-the-counter or prescription medicines.  Eat foods that are high in fiber. These include beans, whole grains, and fresh fruits and vegetables.  Limit foods that are high in fat and sugar. These include fried or sweet foods. Lifestyle  Do not drink alcohol.  Do not use any products that contain nicotine or tobacco, such as cigarettes, e-cigarettes, and chewing tobacco. If you need help quitting, ask your doctor.  Get at least 8 hours of sleep every night.  Limit and deal with stress. General instructions      Keep a journal to find out what may bring on your migraine headaches. For example, write down: ? What you eat and drink. ? How much sleep you get. ? Any change in what you eat or drink. ? Any   change in your medicines.  If you have a migraine headache: ? Avoid things that make your symptoms worse, such as bright lights. ? It may help to lie down in a dark, quiet room. ? Do not drive or use heavy machinery. ? Ask your doctor what activities are safe for you.  Keep all follow-up visits as told by your doctor. This is important. Contact  a doctor if:  You get a migraine headache that is different or worse than others you have had.  You have more than 15 headache days in one month. Get help right away if:  Your migraine headache gets very bad.  Your migraine headache lasts longer than 72 hours.  You have a fever.  You have a stiff neck.  You have trouble seeing.  Your muscles feel weak or like you cannot control them.  You start to lose your balance a lot.  You start to have trouble walking.  You pass out (faint).  You have a seizure. Summary  A migraine headache is a very strong throbbing pain on one side or both sides of your head. These headaches can also cause other symptoms.  This condition may be treated with medicines and changes to your lifestyle.  Keep a journal to find out what may bring on your migraine headaches.  Contact a doctor if you get a migraine headache that is different or worse than others you have had.  Contact your doctor if you have more than 15 headache days in a month. This information is not intended to replace advice given to you by your health care provider. Make sure you discuss any questions you have with your health care provider. Document Released: 12/08/2007 Document Revised: 06/22/2018 Document Reviewed: 04/12/2018 Elsevier Patient Education  2020 [employer]   Postpartum Hypertension Postpartum hypertension is high blood pressure that remains higher than normal after childbirth. You may not realize that you have postpartum hypertension if your blood pressure is not being checked regularly. In most cases, postpartum hypertension will go away on its own, usually within a week of delivery. However, for some women, medical treatment is required to prevent serious complications, such as seizures or stroke. What are the causes? This condition may be caused by one or more of the following:  Hypertension that existed before pregnancy (chronic hypertension).  Hypertension  that comes on as a result of pregnancy (gestational hypertension).  Hypertensive disorders during pregnancy (preeclampsia) or seizures in women who have high blood pressure during pregnancy (eclampsia).  A condition in which the liver, platelets, and red blood cells are damaged during pregnancy (HELLP syndrome).  A condition in which the thyroid produces too much hormones (hyperthyroidism).  Other rare problems of the nerves (neurological disorders) or blood disorders. In some cases, the cause may not be known. What increases the risk? The following factors may make you more likely to develop this condition:  Chronic hypertension. In some cases, this may not have been diagnosed before pregnancy.  Obesity.  Type 2 diabetes.  Kidney disease.  History of preeclampsia or eclampsia.  Other medical conditions that change the level of hormones in the body (hormonal imbalance). What are the signs or symptoms? As with all types of hypertension, postpartum hypertension may not have any symptoms. Depending on how high your blood pressure is, you may experience:  Headaches. These may be mild, moderate, or severe. They may also be steady, constant, or sudden in onset (thunderclap headache).  Changes in your ability to see (visual  changes).  Dizziness.  Shortness of breath.  Swelling of your hands, feet, lower legs, or face. In some cases, you may have swelling in more than one of these locations.  Heart palpitations or a racing heartbeat.  Difficulty breathing while lying down.  Decrease in the amount of urine that you pass. Other rare signs and symptoms may include:  Sweating more than usual. This lasts longer than a few days after delivery.  Chest pain.  Sudden dizziness when you get up from sitting or lying down.  Seizures.  Nausea or vomiting.  Abdominal pain. How is this diagnosed? This condition may be diagnosed based on the results of a physical exam, blood pressure  measurements, and blood and urine tests. You may also have other tests, such as a CT scan or an MRI, to check for other problems of postpartum hypertension. How is this treated? If blood pressure is high enough to require treatment, your options may include:  Medicines to reduce blood pressure (antihypertensives). Tell your health care provider if you are breastfeeding or if you plan to breastfeed. There are many antihypertensive medicines that are safe to take while breastfeeding.  Stopping medicines that may be causing hypertension.  Treating medical conditions that are causing hypertension.  Treating the complications of hypertension, such as seizures, stroke, or kidney problems. Your health care provider will also continue to monitor your blood pressure closely until it is within a safe range for you. Follow these instructions at home:  Take over-the-counter and prescription medicines only as told by your health care provider.  Return to your normal activities as told by your health care provider. Ask your health care provider what activities are safe for you.  Do not use any products that contain nicotine or tobacco, such as cigarettes and e-cigarettes. If you need help quitting, ask your health care provider.  Keep all follow-up visits as told by your health care provider. This is important. Contact a health care provider if:  Your symptoms get worse.  You have new symptoms, such as: ? A headache that does not get better. ? Dizziness. ? Visual changes. Get help right away if:  You suddenly develop swelling in your hands, ankles, or face.  You have sudden, rapid weight gain.  You develop difficulty breathing, chest pain, racing heartbeat, or heart palpitations.  You develop severe pain in your abdomen.  You have any symptoms of a stroke. "BE FAST" is an easy way to remember the main warning signs of a stroke: ? B - Balance. Signs are dizziness, sudden trouble walking, or  loss of balance. ? E - Eyes. Signs are trouble seeing or a sudden change in vision. ? F - Face. Signs are sudden weakness or numbness of the face, or the face or eyelid drooping on one side. ? A - Arms. Signs are weakness or numbness in an arm. This happens suddenly and usually on one side of the body. ? S - Speech. Signs are sudden trouble speaking, slurred speech, or trouble understanding what people say. ? T - Time. Time to call emergency services. Write down what time symptoms started.  You have other signs of a stroke, such as: ? A sudden, severe headache with no known cause. ? Nausea or vomiting. ? Seizure. These symptoms may represent a serious problem that is an emergency. Do not wait to see if the symptoms will go away. Get medical help right away. Call your local emergency services (911 in the [place] Do not drive  yourself to the hospital. Summary  Postpartum hypertension is high blood pressure that remains higher than normal after childbirth.  In most cases, postpartum hypertension will go away on its own, usually within a week of delivery.  For some women, medical treatment is required to prevent serious complications, such as seizures or stroke. This information is not intended to replace advice given to you by your health care provider. Make sure you discuss any questions you have with your health care provider. Document Released: 11/01/2013 Document Revised: 04/06/2018 Document Reviewed: 12/19/2016 Elsevier Patient Education  2020 Else[employer] Inc.  Hydrochlorothiazide, HCTZ capsules or tablets What is this medicine? HYDROCHLOROTHIAZIDE (hye droe klor oh THYE a zide) is a diuretic. It increases the amount of urine passed, which causes the body to lose salt and water. This medicine is used to treat high blood pressure. It is also reduces the swelling and water retention caused by various medical conditions, such as heart, liver, or kidney disease. This medicine may be used for  other purposes; ask your health care provider or pharmacist if you have questions. COMMON BRAND NAME(S): Esidrix, Ezide, HydroDIURIL, Microzide, Oretic, Zide What should I tell my health care provider before I take this medicine? They need to know if you have any of these conditions:  diabetes  gout  immune system problems, like lupus  kidney disease or kidney stones  liver disease  pancreatitis  small amount of urine or difficulty passing urine  an unusual or allergic reaction to hydrochlorothiazide, sulfa drugs, other medicines, foods, dyes, or preservatives  pregnant or trying to get pregnant  breast-feeding How should I use this medicine? Take this medicine by mouth with a glass of water. Follow the directions on the prescription label. Take your medicine at regular intervals. Remember that you will need to pass urine frequently after taking this medicine. Do not take your doses at a time of day that will cause you problems. Do not stop taking your medicine unless your doctor tells you to. Talk to your pediatrician regarding the use of this medicine in children. Special care may be needed. Overdosage: If you think you have taken too much of this medicine contact a poison control center or emergency room at once. NOTE: This medicine is only for you. Do not share this medicine with others. What if I miss a dose? If you miss a dose, take it as soon as you can. If it is almost time for your next dose, take only that dose. Do not take double or extra doses. What may interact with this medicine?  cholestyramine  colestipol  digoxin  dofetilide  lithium  medicines for blood pressure  medicines for diabetes  medicines that relax muscles for surgery  other diuretics  steroid medicines like prednisone or cortisone This list may not describe all possible interactions. Give your health care provider a list of all the medicines, herbs, non-prescription drugs, or dietary  supplements you use. Also tell them if you smoke, drink alcohol, or use illegal drugs. Some items may interact with your medicine. What should I watch for while using this medicine? Visit your doctor or health care professional for regular checks on your progress. Check your blood pressure as directed. Ask your doctor or health care professional what your blood pressure should be and when you should contact him or her. You may need to be on a special diet while taking this medicine. Ask your doctor. Check with your doctor or health care professional if you get an  attack of severe diarrhea, nausea and vomiting, or if you sweat a lot. The loss of too much body fluid can make it dangerous for you to take this medicine. You may get drowsy or dizzy. Do not drive, use machinery, or do anything that needs mental alertness until you know how this medicine affects you. Do not stand or sit up quickly, especially if you are an older patient. This reduces the risk of dizzy or fainting spells. Alcohol may interfere with the effect of this medicine. Avoid alcoholic drinks. This medicine may increase blood sugar. Ask your healthcare provider if changes in diet or medicines are needed if you have diabetes. This medicine can make you more sensitive to the sun. Keep out of the sun. If you cannot avoid being in the sun, wear protective clothing and use sunscreen. Do not use sun lamps or tanning beds/booths. What side effects may I notice from receiving this medicine? Side effects that you should report to your doctor or health care professional as soon as possible:  allergic reactions such as skin rash or itching, hives, swelling of the lips, mouth, tongue, or throat  changes in vision  chest pain  eye pain  fast or irregular heartbeat  feeling faint or lightheaded, falls  gout attack  muscle pain or cramps  pain or difficulty when passing urine  pain, tingling, numbness in the hands or feet  redness,  blistering, peeling or loosening of the skin, including inside the mouth   signs and symptoms of high blood sugar such as being more thirsty or hungry or having to urinate more than normal. You may also feel very tired or have blurry vision.  unusually weak Side effects that usually do not require medical attention (report to your doctor or health care professional if they continue or are bothersome):  change in sex drive or performance  dry mouth  headache  stomach upset This list may not describe all possible side effects. Call your doctor for medical advice about side effects. You may report side effects to FDA at 1-800-FDA-1088. Where should I keep my medicine? Keep out of the reach of children. Store at room temperature between 15 and 30 degrees C (59 and 86 degrees F). Do not freeze. Protect from light and moisture. Keep container closed tightly. Throw away any unused medicine after the expiration date. NOTE: This sheet is a summary. It may not cover all possible information. If you have questions about this medicine, talk to your doctor, pharmacist, or health care provider.  2020 Elsevier/Gold Standard (2017-12-20 09:25:06)

## 2018-12-04 LAB — CBC
Hematocrit: 40.1 % (ref 34.0–46.6)
Hemoglobin: 13.4 g/dL (ref 11.1–15.9)
MCH: 29.8 pg (ref 26.6–33.0)
MCHC: 33.4 g/dL (ref 31.5–35.7)
MCV: 89 fL (ref 79–97)
Platelets: 248 10*3/uL (ref 150–450)
RBC: 4.49 x10E6/uL (ref 3.77–5.28)
RDW: 13.1 % (ref 11.7–15.4)
WBC: 6.2 10*3/uL (ref 3.4–10.8)

## 2018-12-04 LAB — COMPREHENSIVE METABOLIC PANEL
ALT: 16 IU/L (ref 0–32)
AST: 13 IU/L (ref 0–40)
Albumin/Globulin Ratio: 1.8 (ref 1.2–2.2)
Albumin: 4.1 g/dL (ref 3.8–4.8)
Alkaline Phosphatase: 94 IU/L (ref 39–117)
BUN/Creatinine Ratio: 17 (ref 9–23)
BUN: 14 mg/dL (ref 6–20)
Bilirubin Total: 0.4 mg/dL (ref 0.0–1.2)
CO2: 23 mmol/L (ref 20–29)
Calcium: 8.4 mg/dL — ABNORMAL LOW (ref 8.7–10.2)
Chloride: 106 mmol/L (ref 96–106)
Creatinine, Ser: 0.83 mg/dL (ref 0.57–1.00)
GFR calc Af Amer: 104 mL/min/{1.73_m2} (ref >59)
GFR calc non Af Amer: 90 mL/min/{1.73_m2} (ref >59)
Globulin, Total: 2.3 g/dL (ref 1.5–4.5)
Glucose: 81 mg/dL (ref 65–99)
Potassium: 4.7 mmol/L (ref 3.5–5.2)
Sodium: 141 mmol/L (ref 134–144)
Total Protein: 6.4 g/dL (ref 6.0–8.5)

## 2018-12-04 LAB — MAGNESIUM: Magnesium: 2.1 mg/dL (ref 1.6–2.3)

## 2018-12-07 ENCOUNTER — Encounter: Payer: Self-pay | Admitting: Certified Nurse Midwife

## 2018-12-10 ENCOUNTER — Encounter: Payer: Self-pay | Admitting: Certified Nurse Midwife

## 2018-12-10 ENCOUNTER — Encounter: Payer: BLUE CROSS/BLUE SHIELD | Admitting: Certified Nurse Midwife

## 2018-12-14 ENCOUNTER — Encounter: Payer: Self-pay | Admitting: Certified Nurse Midwife

## 2018-12-14 ENCOUNTER — Ambulatory Visit (INDEPENDENT_AMBULATORY_CARE_PROVIDER_SITE_OTHER): Payer: BLUE CROSS/BLUE SHIELD | Admitting: Certified Nurse Midwife

## 2018-12-14 ENCOUNTER — Other Ambulatory Visit: Payer: Self-pay

## 2018-12-14 MED ORDER — SLYND 4 MG PO TABS: 1.0000 | ORAL_TABLET | Freq: Every day | ORAL | 11 refills | Status: DC

## 2018-12-14 NOTE — Progress Notes (Signed)
Subjective:    Jamie Dudley is a 38 y.o. G42P1001 Caucasian female who presents for a postpartum visit. She is 3 weeks postpartum following a spontaneous vaginal delivery at 40+4 gestational weeks. Anesthesia: local. I have fully reviewed the prenatal and intrapartum course.   Postpartum course has been complicated by a postpartum headache that has since resolved. Baby's course has been uncomplicated.   Baby is feeding by breast. Bleeding thin lochia. Bowel function is normal. Bladder function is normal.   Patient is not sexually active. Contraception method is uncertain. Postpartum depression screening: negative. Score 2.  Last pap 08/2016 and was Negative/Negative.  Denies difficulty breathing or respiratory distress, chest pain, abdominal pain, excessive vaginal bleeding, dysuria, and leg pain or swelling.   The following portions of the patient's history were reviewed and updated as appropriate: allergies, current medications, past medical history, past surgical history and problem list.  Review of Systems  Pertinent items are noted in HPI.   Objective:   BP 125/89   Pulse (!) 42   Ht 5\' 5"  (1.651 m)   Wt 143 lb 4.8 oz (65 kg)   LMP  (LMP Unknown)   Breastfeeding Yes   BMI 23.85 kg/m   General:  alert, cooperative and no distress   Breasts:  deferred, no complaints  Lungs: clear to auscultation bilaterally  Heart:  regular rate and rhythm  Abdomen: soft, nontender   Vulva: normal  Vagina: normal vagina  Cervix:  closed  Corpus: Well-involuted  Adnexa:  Non-palpable    Depression screen [facility] 2/9 12/14/2018 12/03/2018 10/19/2018 09/19/2018 09/03/2018  Decreased Interest 0 0 0 0 0  Down, Depressed, Hopeless 0 0 1 0 0  PHQ - 2 Score 0 0 1 0 0  Altered sleeping 0 0 0 0 0  Tired, decreased energy 0 1 0 0 0  Change in appetite 0 0 0 0 0  Feeling bad or failure about yourself  0 0 1 0 0  Trouble concentrating 1 1 1 1  0  Moving slowly or fidgety/restless 1 0 1 1 0  Suicidal thoughts  0 0 0 0 0  PHQ-9 Score 2 2 4 2  0  Difficult doing work/chores Not difficult at all Not difficult at all - - -   GAD 7 : Generalized Anxiety Score 12/14/2018 12/03/2018  Nervous, Anxious, on Edge 0 0  Control/stop worrying 0 0  Worry too much - different things 0 0  Trouble relaxing 0 0  Restless 3 1  Easily annoyed or irritable 0 0  Afraid - awful might happen 0 0  Total GAD 7 Score 3 1  Anxiety Difficulty Not difficult at all Not difficult at all        Assessment:   Postpartum exam Three (3) wks s/p spontaneous vaginal birth Breastfeeding Depression screening Contraception counseling   Plan:   Rx Slynd, see orders. Samples given.   Encourage routine health maintenance techniques.   Reviewed red flag symptoms and when to call.   Follow up in: 8 months for ANNUAL EXAM or earlier if needed.    Diona Fanti, CNM [facility], [facility] 12/14/18 4:07 PM

## 2018-12-14 NOTE — Patient Instructions (Signed)
Preventive Care 21-39 Years Old, Female Preventive care refers to visits with your health care provider and lifestyle choices that can promote health and wellness. This includes:  A yearly physical exam. This may also be called an annual well check.  Regular dental visits and eye exams.  Immunizations.  Screening for certain conditions.  Healthy lifestyle choices, such as eating a healthy diet, getting regular exercise, not using drugs or products that contain nicotine and tobacco, and limiting alcohol use. What can I expect for my preventive care visit? Physical exam Your health care provider will check your:  Height and weight. This may be used to calculate body mass index (BMI), which tells if you are at a healthy weight.  Heart rate and blood pressure.  Skin for abnormal spots. Counseling Your health care provider may ask you questions about your:  Alcohol, tobacco, and drug use.  Emotional well-being.  Home and relationship well-being.  Sexual activity.  Eating habits.  Work and work environment.  Method of birth control.  Menstrual cycle.  Pregnancy history. What immunizations do I need?  Influenza (flu) vaccine  This is recommended every year. Tetanus, diphtheria, and pertussis (Tdap) vaccine  You may need a Td booster every 10 years. Varicella (chickenpox) vaccine  You may need this if you have not been vaccinated. Human papillomavirus (HPV) vaccine  If recommended by your health care provider, you may need three doses over 6 months. Measles, mumps, and rubella (MMR) vaccine  You may need at least one dose of MMR. You may also need a second dose. Meningococcal conjugate (MenACWY) vaccine  One dose is recommended if you are age 19-21 years and a first-year college student living in a residence hall, or if you have one of several medical conditions. You may also need additional booster doses. Pneumococcal conjugate (PCV13) vaccine  You may need  this if you have certain conditions and were not previously vaccinated. Pneumococcal polysaccharide (PPSV23) vaccine  You may need one or two doses if you smoke cigarettes or if you have certain conditions. Hepatitis A vaccine  You may need this if you have certain conditions or if you travel or work in places where you may be exposed to hepatitis A. Hepatitis B vaccine  You may need this if you have certain conditions or if you travel or work in places where you may be exposed to hepatitis B. Haemophilus influenzae type b (Hib) vaccine  You may need this if you have certain conditions. You may receive vaccines as individual doses or as more than one vaccine together in one shot (combination vaccines). Talk with your health care provider about the risks and benefits of combination vaccines. What tests do I need?  Blood tests  Lipid and cholesterol levels. These may be checked every 5 years starting at age 20.  Hepatitis C test.  Hepatitis B test. Screening  Diabetes screening. This is done by checking your blood sugar (glucose) after you have not eaten for a while (fasting).  Sexually transmitted disease (STD) testing.  BRCA-related cancer screening. This may be done if you have a family history of breast, ovarian, tubal, or peritoneal cancers.  Pelvic exam and Pap test. This may be done every 3 years starting at age 21. Starting at age 30, this may be done every 5 years if you have a Pap test in combination with an HPV test. Talk with your health care provider about your test results, treatment options, and if necessary, the need for more tests.   Follow these instructions at home: Eating and drinking   Eat a diet that includes fresh fruits and vegetables, whole grains, lean protein, and low-fat dairy.  Take vitamin and mineral supplements as recommended by your health care provider.  Do not drink alcohol if: ? Your health care provider tells you not to drink. ? You are  pregnant, may be pregnant, or are planning to become pregnant.  If you drink alcohol: ? Limit how much you have to 0-1 drink a day. ? Be aware of how much alcohol is in your drink. In the [place] one drink equals one 12 oz bottle of beer (355 mL), one 5 oz glass of wine (148 mL), or one 1 oz glass of hard liquor (44 mL). Lifestyle  Take daily care of your teeth and gums.  Stay active. Exercise for at least 30 minutes on 5 or more days each week.  Do not use any products that contain nicotine or tobacco, such as cigarettes, e-cigarettes, and chewing tobacco. If you need help quitting, ask your health care provider.  If you are sexually active, practice safe sex. Use a condom or other form of birth control (contraception) in order to prevent pregnancy and STIs (sexually transmitted infections). If you plan to become pregnant, see your health care provider for a preconception visit. What's next?  Visit your health care provider once a year for a well check visit.  Ask your health care provider how often you should have your eyes and teeth checked.  Stay up to date on all vaccines. This information is not intended to replace advice given to you by your health care provider. Make sure you discuss any questions you have with your health care provider. Document Released: 04/26/2001 Document Revised: 11/09/2017 Document Reviewed: 11/09/2017 Elsevier Patient Education  [place]. Drospirenone tablets (contraception) What is this medicine? DROSPIRENONE (dro SPY re nown) is an oral contraceptive (birth control pill). The product contains a female hormone known as a progestin. It is used to prevent pregnancy. This medicine may be used for other purposes; ask your health care provider or pharmacist if you have questions. COMMON BRAND NAME(S): FeRiva 21/7, SLYND What should I tell my health care provider before I take this medicine? They need to know if you have any of these conditions:   abnormal vaginal bleeding  adrenal gland disease  blood vessel disease or blood clots  breast, cervical, endometrial, ovarian, liver, or uterine cancer  diabetes  heart disease or recent heart attack  high potassium level  kidney disease  liver disease  mental depression  migraine headaches  stroke  an unusual or allergic reaction to drospirenone, progestins, or other medicines, foods, dyes, or preservatives  pregnant or trying to get pregnant  breast-feeding How should I use this medicine? Take this medicine by mouth. To reduce nausea, this medicine may be taken with food. Follow the directions on the prescription label. Take this medicine at the same time each day and in the order directed on the package. Do not take your medicine more often than directed. A patient package insert for the product will be given with each prescription and refill. Read this sheet carefully each time. The sheet may change frequently. Talk to your pediatrician regarding the use of this medicine in children. Special care may be needed. This medicine has been used in female children who have started having menstrual periods. Overdosage: If you think you have taken too much of this medicine contact a poison control center  or emergency room at once. NOTE: This medicine is only for you. Do not share this medicine with others. What if I miss a dose? If you miss a dose, take it as soon as you can and refer to the patient information sheet you received with your medicine for direction. If you miss more than one pill, this medicine may not be as effective and you may need to use another form of birth control. What may interact with this medicine? Do not take this medicine with any of the following medications:  atazanavir; cobicistat  bosentan  fosamprenavir This medicine may also interact with the following medications:  aprepitant  barbiturates like phenobarbital, primidone  carbamazepine   certain antibiotics like clarithromycin, rifampin, rifabutin, rifapentine  certain antivirals for HIV or hepatitis  certain diuretics like amiloride, spironolactone, triamterene  certain medicines for fungal infections like griseofulvin, ketoconazole, itraconazole, voriconazole  certain medicines for blood pressure, heart disease  cyclosporine  felbamate  heparin  medicines for diabetes  modafinil  NSAIDs, medicines for pain and inflammation, like ibuprofen or naproxen  oxcarbazepine  phenytoin  potassium supplements  rufinamide  St. John's wort  topiramate This list may not describe all possible interactions. Give your health care provider a list of all the medicines, herbs, non-prescription drugs, or dietary supplements you use. Also tell them if you smoke, drink alcohol, or use illegal drugs. Some items may interact with your medicine. What should I watch for while using this medicine? Visit your doctor or health care professional for regular checks on your progress. You will need a regular breast and pelvic exam and Pap smear while on this medicine. You may need blood work done while you are taking this medicine. If you have any reason to think you are pregnant, stop taking this medicine right away and contact your doctor or health care professional. This medicine does not protect you against HIV infection (AIDS) or any other sexually transmitted diseases. If you are going to have elective surgery, you may need to stop taking this medicine before the surgery. Consult your health care professional for advice. What side effects may I notice from receiving this medicine? Side effects that you should report to your doctor or health care professional as soon as possible:  allergic reactions like skin rash, itching or hives, swelling of the face, lips, or tongue  breast tissue changes or discharge  depressed mood  severe pain, swelling, or tenderness in the abdomen   signs and symptoms of a blood clot such as chest pain; shortness of breath; pain, swelling, or warmth in the leg  signs and symptoms of increased potassium like muscle weakness; chest pain; or fast, irregular heartbeat  signs and symptoms of liver injury like dark yellow or brown urine; general ill feeling or flu-like symptoms; light-colored stools; loss of appetite; nausea; right upper belly pain; unusually weak or tired; yellowing of the eyes or skin  signs and symptoms of a stroke like changes in vision; confusion; trouble speaking or understanding; severe headaches; sudden numbness or weakness of the face, arm or leg; trouble walking; dizziness; loss of balance or coordination  unusual vaginal bleeding  unusually weak or tired Side effects that usually do not require medical attention (report these to your doctor or health care professional if they continue or are bothersome):  acne  breast tenderness  headache  menstrual cramps  nausea  weight gain This list may not describe all possible side effects. Call your doctor for medical advice about side  effects. You may report side effects to FDA at 1-800-FDA-1088. Where should I keep my medicine? Keep out of the reach of children. Store at room temperature between 20 and 25 degrees C (68 and 77 degrees F). Throw away any unused medicine after the expiration date. NOTE: This sheet is a summary. It may not cover all possible information. If you have questions about this medicine, talk to your doctor, pharmacist, or health care provider.  2020 Elsevier/Gold Standard (2017-08-09 15:01:56)

## 2018-12-14 NOTE — Progress Notes (Signed)
Patient here for post-partum follow-up, no complaints.

## 2019-01-08 ENCOUNTER — Encounter: Payer: BLUE CROSS/BLUE SHIELD | Admitting: Certified Nurse Midwife

## 2019-05-01 ENCOUNTER — Ambulatory Visit (INDEPENDENT_AMBULATORY_CARE_PROVIDER_SITE_OTHER): Payer: Medicaid Other | Admitting: Family Medicine

## 2019-05-01 ENCOUNTER — Encounter: Payer: Self-pay | Admitting: Family Medicine

## 2019-05-01 ENCOUNTER — Other Ambulatory Visit: Payer: Self-pay

## 2019-05-01 VITALS — BP 120/98 | HR 72 | Ht 65.0 in | Wt 141.0 lb

## 2019-05-01 DIAGNOSIS — M654 Radial styloid tenosynovitis [de Quervain]: Secondary | ICD-10-CM | POA: Diagnosis not present

## 2019-05-01 NOTE — Patient Instructions (Signed)
De Quervain's Tenosynovitis  De Quervain's tenosynovitis is a condition that causes inflammation of the tendon on the thumb side of the wrist. Tendons are cords of tissue that connect bones to muscles. The tendons in the hand pass through a tunnel called a sheath. A slippery layer of tissue (synovium) lets the tendons move smoothly in the sheath. With de Quervain's tenosynovitis, the sheath swells or thickens, causing friction and pain. The condition is also called de Quervain's disease and de Quervain's syndrome. It occurs most often in women who are 30-50 years old. What are the causes? The exact cause of this condition is not known. It may be associated with overuse of the hand and wrist. What increases the risk? You are more likely to develop this condition if you:  Use your hands far more than normal, especially if you repeat certain movements that involve twisting your hand or using a tight grip.  Are pregnant.  Are a middle-aged woman.  Have rheumatoid arthritis.  Have diabetes. What are the signs or symptoms? The main symptom of this condition is pain on the thumb side of the wrist. The pain may get worse when you grasp something or turn your wrist. Other symptoms may include:  Pain that extends up the forearm.  Swelling of your wrist and hand.  Trouble moving the thumb and wrist.  A sensation of snapping in the wrist.  A bump filled with fluid (cyst) in the area of the pain. How is this diagnosed? This condition may be diagnosed based on:  Your symptoms and medical history.  A physical exam. During the exam, your health care provider may do a simple test (Finkelstein test) that involves pulling your thumb and wrist to see if this causes pain. You may also need to have an X-ray. How is this treated? Treatment for this condition may include:  Avoiding any activity that causes pain and swelling.  Taking medicines. Anti-inflammatory medicines and corticosteroid  injections may be used to reduce inflammation and relieve pain.  Wearing a splint.  Having surgery. This may be needed if other treatments do not work. Once the pain and swelling has gone down:  Physical therapy. This includes stretching and strengthening exercises.  Occupational therapy. This includes adjusting how you move your wrist. Follow these instructions at home: If you have a splint:  Wear the splint as told by your health care provider. Remove it only as told by your health care provider.  Loosen the splint if your fingers tingle, become numb, or turn cold and blue.  Keep the splint clean.  If the splint is not waterproof: ? Do not let it get wet. ? Cover it with a watertight covering when you take a bath or a shower. Managing pain, stiffness, and swelling   Avoid movements and activities that cause pain and swelling in the wrist area.  If directed, put ice on the painful area. This may be helpful after doing activities that involve the sore wrist. ? Put ice in a plastic bag. ? Place a towel between your skin and the bag. ? Leave the ice on for 20 minutes, 2-3 times a day.  Move your fingers often to avoid stiffness and to lessen swelling.  Raise (elevate) the injured area above the level of your heart while you are sitting or lying down. General instructions  Return to your normal activities as told by your health care provider. Ask your health care provider what activities are safe for you.  Take over-the-counter   and prescription medicines only as told by your health care provider.  Keep all follow-up visits as told by your health care provider. This is important. Contact a health care provider if:  Your pain medicine does not help.  Your pain gets worse.  You develop new symptoms. Summary  De Quervain's tenosynovitis is a condition that causes inflammation of the tendon on the thumb side of the wrist.  The condition occurs most often in women who are  30-50 years old.  The exact cause of this condition is not known. It may be associated with overuse of the hand and wrist.  Treatment starts with avoiding activity that causes pain or swelling in the wrist area. Other treatment may include wearing a splint and taking medicine. Sometimes, surgery is needed. This information is not intended to replace advice given to you by your health care provider. Make sure you discuss any questions you have with your health care provider. Document Revised: 08/31/2017 Document Reviewed: 02/06/2017 Elsevier Patient Education  2020 Elsevier Inc.  

## 2019-05-01 NOTE — Progress Notes (Signed)
Date:  05/01/2019   Name:  Jamie Dudley   DOB:  1980-06-04   MRN:  [id number]   Chief Complaint: Wrist Pain (L) wrist pain/ below thumb x 3 weeks)  Wrist Pain  The pain is present in the left wrist. This is a new problem. The current episode started 1 to 4 weeks ago. The problem occurs constantly. The problem has been gradually worsening. The pain is at a severity of 6/10. Associated symptoms include an inability to bear weight, a limited range of motion and stiffness. Pertinent negatives include no fever, itching, joint locking, joint swelling, numbness or tingling. The symptoms are aggravated by activity. She has tried NSAIDS and acetaminophen for the symptoms. The treatment provided no relief.    Lab Results  Component Value Date   CREATININE 0.83 12/03/2018   BUN 14 12/03/2018   NA 141 12/03/2018   K 4.7 12/03/2018   CL 106 12/03/2018   CO2 23 12/03/2018   Lab Results  Component Value Date   CHOL 225 (H) 09/18/2017   HDL 61 09/18/2017   LDLCALC 147 (H) 09/18/2017   TRIG 85 09/18/2017   CHOLHDL 3.7 09/18/2017   Lab Results  Component Value Date   TSH 2.830 01/25/2017   No results found for: HGBA1C   Review of Systems  Constitutional: Negative.  Negative for chills, fatigue, fever and unexpected weight change.  HENT: Negative for congestion, ear discharge, ear pain, hearing loss, postnasal drip, rhinorrhea, sinus pressure, sneezing and sore throat.   Eyes: Negative for photophobia, pain, discharge, redness and itching.  Respiratory: Negative for cough, shortness of breath, wheezing and stridor.   Cardiovascular: Negative for chest pain, palpitations and leg swelling.  Gastrointestinal: Negative for abdominal pain, blood in stool, constipation, diarrhea, nausea and vomiting.  Endocrine: Negative for cold intolerance, heat intolerance, polydipsia, polyphagia and polyuria.  Genitourinary: Negative for dysuria, flank pain, frequency, hematuria, menstrual problem, pelvic pain,  urgency, vaginal bleeding and vaginal discharge.  Musculoskeletal: Positive for stiffness. Negative for arthralgias, back pain and myalgias.  Skin: Negative for itching and rash.  Allergic/Immunologic: Negative for environmental allergies and food allergies.  Neurological: Negative for dizziness, tingling, weakness, light-headedness, numbness and headaches.  Hematological: Negative for adenopathy. Does not bruise/bleed easily.  Psychiatric/Behavioral: Negative for dysphoric mood. The patient is not nervous/anxious.     Patient Active Problem List   Diagnosis Date Noted  . MDD (major depressive disorder), recurrent episode, moderate ([place]) 09/18/2017    No Known Allergies  Past Surgical History:  Procedure Laterality Date  . COLPOSCOPY    . WISDOM TOOTH EXTRACTION      Social History   Tobacco Use  . Smoking status: Never Smoker  . Smokeless tobacco: Never Used  Substance Use Topics  . Alcohol use: Not Currently    Comment: rare  . Drug use: No     Medication list has been reviewed and updated.  Current Meds  Medication Sig  . acetaminophen (TYLENOL) 325 MG tablet Take 2 tablets (650 mg total) by mouth every 4 (four) hours as needed (for pain scale < 4).  . ibuprofen (ADVIL) 600 MG tablet Take 1 tablet (600 mg total) by mouth every 6 (six) hours.  . Prenatal Vit-Fe Fumarate-FA (PRENATAL MULTIVITAMIN) TABS tablet Take 1 tablet by mouth daily at 12 noon.  . triamcinolone cream (KENALOG) 0.1 % Apply 1 application topically 2 (two) times daily.    PHQ 2/9 Scores 05/01/2019 12/14/2018 12/03/2018 10/19/2018  PHQ - 2 Score 0 0  0 1  PHQ- 9 Score 0 2 2 4     BP Readings from Last 3 Encounters:  05/01/19 (!) 120/98  12/14/18 125/89  12/03/18 136/88    Physical Exam Vitals and nursing note reviewed.  Constitutional:      General: She is not in acute distress.    Appearance: She is not diaphoretic.  HENT:     Head: Normocephalic and atraumatic.     Right Ear: Tympanic  membrane, ear canal and external ear normal. There is no impacted cerumen.     Left Ear: Tympanic membrane, ear canal and external ear normal. There is no impacted cerumen.     Nose: Nose normal. No congestion or rhinorrhea.  Eyes:     General:        Right eye: No discharge.        Left eye: No discharge.     Conjunctiva/sclera: Conjunctivae normal.     Pupils: Pupils are equal, round, and reactive to light.  Neck:     Thyroid: No thyromegaly.     Vascular: No JVD.  Cardiovascular:     Rate and Rhythm: Normal rate and regular rhythm.     Heart sounds: Normal heart sounds. No murmur. No friction rub. No gallop.   Pulmonary:     Effort: Pulmonary effort is normal.     Breath sounds: Normal breath sounds. No wheezing or rhonchi.  Abdominal:     General: Bowel sounds are normal.     Palpations: Abdomen is soft. There is no mass.     Tenderness: There is no abdominal tenderness. There is no guarding or rebound.     Hernia: No hernia is present.  Musculoskeletal:        General: No swelling, deformity or signs of injury.     Left wrist: Tenderness present. Decreased range of motion.     Cervical back: Normal range of motion and neck supple.     Right lower leg: No edema.     Left lower leg: No edema.  Lymphadenopathy:     Cervical: No cervical adenopathy.  Skin:    General: Skin is warm and dry.  Neurological:     Mental Status: She is alert.     Deep Tendon Reflexes: Reflexes are normal and symmetric.     Wt Readings from Last 3 Encounters:  05/01/19 141 lb (64 kg)  12/14/18 143 lb 4.8 oz (65 kg)  12/03/18 147 lb 4.8 oz (66.8 kg)    BP (!) 120/98   Pulse 72   Ht 5\' 5"  (1.651 m)   Wt 141 lb (64 kg)   Breastfeeding Yes Comment: no periods  BMI 23.46 kg/m   Assessment and Plan:  1. De Quervain's tenosynovitis, left Acute.  Persistent.  Patient has had point tenderness along the radial aspect of her left wrist.  Exam and history is consistent with de Quervain's  tenosynovitis.  Patient has been encouraged to get a splint and continue anti-inflammatory of choice.  In the meantime patient has been referred to orthopedics for evaluation and since she is breast-feeding whether or not she would be a candidate for injection of the tendon sheath. - Ambulatory referral to Orthopedic Surgery

## 2019-08-05 ENCOUNTER — Other Ambulatory Visit: Payer: Self-pay

## 2019-08-05 ENCOUNTER — Ambulatory Visit (INDEPENDENT_AMBULATORY_CARE_PROVIDER_SITE_OTHER): Payer: Medicaid Other | Admitting: Certified Nurse Midwife

## 2019-08-05 ENCOUNTER — Encounter: Payer: Self-pay | Admitting: Certified Nurse Midwife

## 2019-08-05 ENCOUNTER — Other Ambulatory Visit (HOSPITAL_COMMUNITY)
Admission: RE | Admit: 2019-08-05 | Discharge: 2019-08-05 | Disposition: A | Discharge status: Home / Self Care | Payer: Medicaid Other | Source: Ambulatory Visit | Attending: Certified Nurse Midwife | Admitting: Certified Nurse Midwife

## 2019-08-05 VITALS — BP 130/87 | HR 71 | Ht 65.0 in | Wt 140.9 lb

## 2019-08-05 DIAGNOSIS — N898 Other specified noninflammatory disorders of vagina: Secondary | ICD-10-CM | POA: Diagnosis not present

## 2019-08-05 NOTE — Patient Instructions (Addendum)

## 2019-08-05 NOTE — Progress Notes (Signed)
Pt present for possible vaginal prolapse. Pt stated that she has noticed something in her vaginal area and milky white vaginal discharge no odor.

## 2019-08-05 NOTE — Progress Notes (Signed)
I have seen, interviewed, and examined the patient in conjunction with the [employer] Nurse Practitioner student and affirm the diagnosis and management plan.   Gunnar Bulla, CNM [facility], [facility] 08/05/19 5:33 PM

## 2019-08-05 NOTE — Progress Notes (Signed)
GYN ENCOUNTER NOTE  Subjective:       Jamie Dudley is a 39 y.o. G36P1001 female is here for gynecologic evaluation of the following issues:  1. Concern for vaginal prolapse 2. Vaginal discharge  Reports Wednesday night after using the exercise bike noticed something protruding from her vagina. Concern for prolapse. Reports it felt like a tampon that was not inserted all the way in. Used witch hazel and ice packs with some relief.   Notes increase in vaginal discharge since Wednesday night without itching, odor or burning.  Denies difficulty breathing or respiratory distress, chest pain, abdominal pain, excessive vaginal bleeding, dysuria, leg pain or swelling   Gynecologic History  No LMP recorded. Lactating mother.  Contraception: NFP   Last Pap: 09/08/17. Results were: Neg/Neg  Obstetric History  OB History  Gravida Para Term Preterm AB Living  1 1 1  0 0 1  SAB TAB Ectopic Multiple Live Births  0 0 0 0 1    # Outcome Date GA Lbr Len/2nd Weight Sex Delivery Anes PTL Lv  1 Term 11/21/18 [redacted]w[redacted]d / 01:09 7 lb 7.9 oz (3.4 kg) M Vag-Spont None  LIV    Past Medical History:  Diagnosis Date  . Atypical squamous cells of undetermined significance (ASC-US) on cervical Pap smear   . Depression   . Sinusitis   . Skin-picking disorder     Past Surgical History:  Procedure Laterality Date  . COLPOSCOPY    . WISDOM TOOTH EXTRACTION      Current Outpatient Medications on File Prior to Visit  Medication Sig Dispense Refill  . acetaminophen (TYLENOL) 325 MG tablet Take 2 tablets (650 mg total) by mouth every 4 (four) hours as needed (for pain scale < 4). 60 tablet 1  . ibuprofen (ADVIL) 600 MG tablet Take 1 tablet (600 mg total) by mouth every 6 (six) hours. 30 tablet 0  . Prenatal Vit-Fe Fumarate-FA (PRENATAL MULTIVITAMIN) TABS tablet Take 1 tablet by mouth daily at 12 noon.    . triamcinolone cream (KENALOG) 0.1 % Apply 1 application topically 2 (two) times daily. 30 g 0   No  current facility-administered medications on file prior to visit.    No Known Allergies  Social History   Socioeconomic History  . Marital status: Single    Spouse name: Not on file  . Number of children: Not on file  . Years of education: Not on file  . Highest education level: Not on file  Occupational History  . Not on file  Tobacco Use  . Smoking status: Never Smoker  . Smokeless tobacco: Never Used  Substance and Sexual Activity  . Alcohol use: Not Currently    Comment: rare  . Drug use: No  . Sexual activity: Yes    Birth control/protection: None  Other Topics Concern  . Not on file  Social History Narrative  . Not on file   Social Determinants of Health   Financial Resource Strain:   . Difficulty of Paying Living Expenses:   Food Insecurity:   . Worried About Charity fundraiser in the Last Year:   . Arboriculturist in the Last Year:   Transportation Needs:   . Film/video editor (Medical):   Marland Kitchen Lack of Transportation (Non-Medical):   Physical Activity:   . Days of Exercise per Week:   . Minutes of Exercise per Session:   Stress:   . Feeling of Stress :   Social Connections:   . Frequency  of Communication with Friends and Family:   . Frequency of Social Gatherings with Friends and Family:   . Attends Religious Services:   . Active Member of Clubs or Organizations:   . Attends Banker Meetings:   Marland Kitchen Marital Status:   Intimate Partner Violence:   . Fear of Current or Ex-Partner:   . Emotionally Abused:   Marland Kitchen Physically Abused:   . Sexually Abused:     Family History  Problem Relation Age of Onset  . Heart disease Paternal Grandfather   . Hypertension Mother   . Breast cancer Neg Hx   . Ovarian cancer Neg Hx   . Colon cancer Neg Hx     The following portions of the patient's history were reviewed and updated as appropriate: allergies, current medications, past family history, past medical history, past social history, past surgical  history and problem list.  Review of Systems  Review of Systems - negative except as noted above. Information obtained from patient.   Objective:   BP 130/87   Pulse 71   Ht 5\' 5"  (1.651 m)   Wt 140 lb 14.4 oz (63.9 kg)   Breastfeeding Yes   BMI 23.45 kg/m    CONSTITUTIONAL: Well-developed, well-nourished female in no acute distress.   content.  PELVIC:  External Genitalia: Normal  Vagina: Normal except redounded vaginal wall tissue; no changes with straining or pressure  Cervix: Normal  Uterus: Normal size, shape,consistency, mobile  Adnexa: Normal   Assessment:   1. Vaginal discharge  - Cervicovaginal ancillary only     Plan:   Continue using ice packs and witch hazel as needed.  Premarin sample given.  Aptima swab collected today: See orders.  Encouraged to continue with routine preventative care.  Reviewed red flags and when to call the office.  RTC as previously scheduled.   RN [facility] [employer] 08/05/19 5:23 PM

## 2019-08-07 LAB — CERVICOVAGINAL ANCILLARY ONLY
Bacterial Vaginitis (gardnerella): NEGATIVE
Candida Glabrata: NEGATIVE
Candida Vaginitis: NEGATIVE
Comment: NEGATIVE
Comment: NEGATIVE
Comment: NEGATIVE

## 2019-08-15 ENCOUNTER — Other Ambulatory Visit: Payer: Self-pay

## 2019-08-15 ENCOUNTER — Ambulatory Visit (INDEPENDENT_AMBULATORY_CARE_PROVIDER_SITE_OTHER): Payer: Medicaid Other | Admitting: Certified Nurse Midwife

## 2019-08-15 ENCOUNTER — Encounter: Payer: Self-pay | Admitting: Certified Nurse Midwife

## 2019-08-15 VITALS — BP 143/85 | HR 75 | Ht 65.0 in | Wt 139.3 lb

## 2019-08-15 DIAGNOSIS — Z3009 Encounter for other general counseling and advice on contraception: Secondary | ICD-10-CM

## 2019-08-15 DIAGNOSIS — Z01419 Encounter for gynecological examination (general) (routine) without abnormal findings: Secondary | ICD-10-CM

## 2019-08-15 DIAGNOSIS — Z0000 Encounter for general adult medical examination without abnormal findings: Secondary | ICD-10-CM

## 2019-08-15 NOTE — Patient Instructions (Signed)
Breastfeeding and Returning to Work Returning to work after having a baby can be a challenging time. However, this does not mean that you need to stop breastfeeding. Take steps to plan for your return to work so you can continue breastfeeding. What can I do to prepare for my return to work? Before you return to work:  Take a breastfeeding class or meet with a lactation consultant to discuss your options for continuing to breastfeed.  Let your employer know that you plan to continue to breastfeed. Find out if your work offers: ? A lactation support program. ? A lactation room or other area that is set aside for breastfeeding mothers. ? A refrigerator or other place where you can store breast milk while you are at work.  Discuss your work schedule with your employer. You may have options for breast pumping breaks or flexible shifts, depending on workplace policies.  If possible, find a child care option that is close to work. This allows you to visit and breastfeed your baby during lunch or breaks. Try to take as many weeks off from work as you can. This can help your body recover and establish a breastfeeding routine. What are some tips for transitioning my baby to bottle-feeding?   Practice pumping breast milk several days before you go back to work. Doing this will allow you to develop techniques that work for you, become comfortable with pumping, and build up your milk supply. It will also give your baby time to get used to drinking pumped milk from a bottle.  Pump breast milk when you are away from your baby. This may be during your baby's naps or when someone else is looking after your child.  Encourage your baby to drink pumped milk out of a bottle. It may be easier and less confusing for your baby if someone else offers the bottle for the first time.  When your baby learns to drink from a bottle and is able to get enough nutrition from a bottle feeding, gradually replace feedings that  take place during your working hours with bottle feedings.  Pay attention to your child's behaviors and emotions. Consider replacing breastfeeding with a soothing activity. Providing a routine can help your baby feel secure. What are some tips for breast pumping at work? Preparing to pump  Tell your employer that you will need to take breaks to pump. Ask if your employer: ? Can provide a private place to pump that is not a bathroom. ? Has a multi-use pump available. This is a pump that can be shared by several women.  Bring these items with you to work: ? A breast pump. ? Milk storage containers. ? A cooler with ice packs, if a refrigerator is not available at your workplace. ? Extra batteries for a battery-powered breast pump. ? An extra shirt or sweater in case your breasts leak milk. ? A shawl or towel to provide some privacy if you have to pump in a public area. ? Healthy snacks. ? A water bottle. ? Items to help you relax, such as music or a book. ? One of your baby's blankets or a photo of your baby. This can help to trigger your let-down reflex if you are having trouble producing milk. Storing breast milk  If possible, store pumped milk in a refrigerator or in a cooler with ice packs.  If you store your milk in a refrigerator, make sure to label it with your name and the date that you  pumped it.  The amount of time that you can keep breast milk depends on where it is stored: ? At room temperature, breast milk is best if used within 4 hours. If pump parts and containers are well cleaned, breast milk may be kept at room temperature for up to 6-8 hours. ? In a chilled cooler with ice packs, breast milk can be stored for up to 24 hours. ? In the refrigerator, breast milk is best if used within 3 days. If pump parts and containers are well cleaned, breast milk may be stored in the refrigerator for up to 5-8 days. General tips  To avoid needing to add formula feedings (supplement)  to breast milk feedings, pump at the times when you usually feed your baby. The more often you pump, the more milk you will produce.  When you are away from your baby, pump every 2-3 hours for about 15 minutes.  Pump both breasts at the same time, if you can. Summary  You can continue to breastfeed when you return to work. Let your employer know that you plan to continue breastfeeding, and find out about resources and flexible scheduling options that are available to you at your workplace.  Practice pumping breast milk several days before you go back to work. Doing this will allow you to develop techniques that work for you, become comfortable with pumping, and build up your milk supply. It will also give your baby time to get used to drinking pumped milk from a bottle. This information is not intended to replace advice given to you by your health care provider. Make sure you discuss any questions you have with your health care provider. Document Revised: 04/01/2017 Document Reviewed: 07/22/2016 Elsevier Patient Education  2020 [place] 25-36 Years Old, Female Preventive care refers to visits with your health care provider and lifestyle choices that can promote health and wellness. This includes:  A yearly physical exam. This may also be called an annual well check.  Regular dental visits and eye exams.  Immunizations.  Screening for certain conditions.  Healthy lifestyle choices, such as eating a healthy diet, getting regular exercise, not using drugs or products that contain nicotine and tobacco, and limiting alcohol use. What can I expect for my preventive care visit? Physical exam Your health care provider will check your:  Height and weight. This may be used to calculate body mass index (BMI), which tells if you are at a healthy weight.  Heart rate and blood pressure.  Skin for abnormal spots. Counseling Your health care provider may ask you questions  about your:  Alcohol, tobacco, and drug use.  Emotional well-being.  Home and relationship well-being.  Sexual activity.  Eating habits.  Work and work Statistician.  Method of birth control.  Menstrual cycle.  Pregnancy history. What immunizations do I need?  Influenza (flu) vaccine  This is recommended every year. Tetanus, diphtheria, and pertussis (Tdap) vaccine  You may need a Td booster every 10 years. Varicella (chickenpox) vaccine  You may need this if you have not been vaccinated. Human papillomavirus (HPV) vaccine  If recommended by your health care provider, you may need three doses over 6 months. Measles, mumps, and rubella (MMR) vaccine  You may need at least one dose of MMR. You may also need a second dose. Meningococcal conjugate (MenACWY) vaccine  One dose is recommended if you are age 74-21 years and a Market researcher living in a residence hall, or  if you have one of several medical conditions. You may also need additional booster doses. Pneumococcal conjugate (PCV13) vaccine  You may need this if you have certain conditions and were not previously vaccinated. Pneumococcal polysaccharide (PPSV23) vaccine  You may need one or two doses if you smoke cigarettes or if you have certain conditions. Hepatitis A vaccine  You may need this if you have certain conditions or if you travel or work in places where you may be exposed to hepatitis A. Hepatitis B vaccine  You may need this if you have certain conditions or if you travel or work in places where you may be exposed to hepatitis B. Haemophilus influenzae type b (Hib) vaccine  You may need this if you have certain conditions. You may receive vaccines as individual doses or as more than one vaccine together in one shot (combination vaccines). Talk with your health care provider about the risks and benefits of combination vaccines. What tests do I need?  Blood tests  Lipid and cholesterol  levels. These may be checked every 5 years starting at age 50.  Hepatitis C test.  Hepatitis B test. Screening  Diabetes screening. This is done by checking your blood sugar (glucose) after you have not eaten for a while (fasting).  Sexually transmitted disease (STD) testing.  BRCA-related cancer screening. This may be done if you have a family history of breast, ovarian, tubal, or peritoneal cancers.  Pelvic exam and Pap test. This may be done every 3 years starting at age 50. Starting at age 34, this may be done every 5 years if you have a Pap test in combination with an HPV test. Talk with your health care provider about your test results, treatment options, and if necessary, the need for more tests. Follow these instructions at home: Eating and drinking   Eat a diet that includes fresh fruits and vegetables, whole grains, lean protein, and low-fat dairy.  Take vitamin and mineral supplements as recommended by your health care provider.  Do not drink alcohol if: ? Your health care provider tells you not to drink. ? You are pregnant, may be pregnant, or are planning to become pregnant.  If you drink alcohol: ? Limit how much you have to 0-1 drink a day. ? Be aware of how much alcohol is in your drink. In the [place] one drink equals one 12 oz bottle of beer (355 mL), one 5 oz glass of wine (148 mL), or one 1 oz glass of hard liquor (44 mL). Lifestyle  Take daily care of your teeth and gums.  Stay active. Exercise for at least 30 minutes on 5 or more days each week.  Do not use any products that contain nicotine or tobacco, such as cigarettes, e-cigarettes, and chewing tobacco. If you need help quitting, ask your health care provider.  If you are sexually active, practice safe sex. Use a condom or other form of birth control (contraception) in order to prevent pregnancy and STIs (sexually transmitted infections). If you plan to become pregnant, see your health care provider for  a preconception visit. What's next?  Visit your health care provider once a year for a well check visit.  Ask your health care provider how often you should have your eyes and teeth checked.  Stay up to date on all vaccines. This information is not intended to replace advice given to you by your health care provider. Make sure you discuss any questions you have with your health care provider.  Document Revised: 11/09/2017 Document Reviewed: 11/09/2017 Elsevier Patient Education  2020 [employer].

## 2019-08-15 NOTE — Progress Notes (Signed)
ANNUAL PREVENTATIVE CARE GYN  ENCOUNTER NOTE  Subjective:       Jamie Dudley is a 39 y.o. G51P1001 female here for a routine annual gynecologic exam.    Doing well, plans to return to work next week part time with increase to full time in the fall.   Continuing to breastfeed, has breast pump. Using natural family planning. Would like to discuss other birth control methods in detail.   Feels much better after use of premarin for vaginal symptoms, see last note.   Denies difficulty breathing or respiratory distress, chest pain, abdominal pain, excessive vaginal bleeding, dysuria, leg pain or swelling   Gynecologic History  Patient's last menstrual period was 08/07/2019 (exact date).  Contraception: Lactating mother, Natural Family Planning   Last Pap: 09/08/2016. Results were: Neg/Neg  Obstetric History  OB History  Gravida Para Term Preterm AB Living  1 1 1  0 0 1  SAB TAB Ectopic Multiple Live Births  0 0 0 0 1    # Outcome Date GA Lbr Len/2nd Weight Sex Delivery Anes PTL Lv  1 Term 11/21/18 [redacted]w[redacted]d / 01:09 7 lb 7.9 oz (3.4 kg) M Vag-Spont None  LIV    Past Medical History:  Diagnosis Date  . Atypical squamous cells of undetermined significance (ASC-US) on cervical Pap smear   . Depression   . Sinusitis   . Skin-picking disorder   . Tendonitis     Past Surgical History:  Procedure Laterality Date  . COLPOSCOPY    . WISDOM TOOTH EXTRACTION      Current Outpatient Medications on File Prior to Visit  Medication Sig Dispense Refill  . acetaminophen (TYLENOL) 325 MG tablet Take 2 tablets (650 mg total) by mouth every 4 (four) hours as needed (for pain scale < 4). 60 tablet 1  . ibuprofen (ADVIL) 600 MG tablet Take 1 tablet (600 mg total) by mouth every 6 (six) hours. 30 tablet 0  . Prenatal Vit-Fe Fumarate-FA (PRENATAL MULTIVITAMIN) TABS tablet Take 1 tablet by mouth daily at 12 noon.    . triamcinolone cream (KENALOG) 0.1 % Apply 1 application topically 2 (two) times daily.  30 g 0   No current facility-administered medications on file prior to visit.    No Known Allergies  Social History   Socioeconomic History  . Marital status: Single    Spouse name: Not on file  . Number of children: Not on file  . Years of education: Not on file  . Highest education level: Not on file  Occupational History  . Not on file  Tobacco Use  . Smoking status: Never Smoker  . Smokeless tobacco: Never Used  Substance and Sexual Activity  . Alcohol use: Not Currently    Comment: rare  . Drug use: No  . Sexual activity: Yes    Birth control/protection: None  Other Topics Concern  . Not on file  Social History Narrative  . Not on file   Social Determinants of Health   Financial Resource Strain:   . Difficulty of Paying Living Expenses:   Food Insecurity:   . Worried About [redacted]w[redacted]d in the Last Year:   . Programme researcher, broadcasting/film/video in the Last Year:   Transportation Needs:   . Barista (Medical):   Freight forwarder Lack of Transportation (Non-Medical):   Physical Activity:   . Days of Exercise per Week:   . Minutes of Exercise per Session:   Stress:   . Feeling of Stress :  Social Connections:   . Frequency of Communication with Friends and Family:   . Frequency of Social Gatherings with Friends and Family:   . Attends Religious Services:   . Active Member of Clubs or Organizations:   . Attends Archivist Meetings:   Marland Kitchen Marital Status:   Intimate Partner Violence:   . Fear of Current or Ex-Partner:   . Emotionally Abused:   Marland Kitchen Physically Abused:   . Sexually Abused:     Family History  Problem Relation Age of Onset  . Heart disease Paternal Grandfather   . Hypertension Mother   . Breast cancer Neg Hx   . Ovarian cancer Neg Hx   . Colon cancer Neg Hx     The following portions of the patient's history were reviewed and updated as appropriate: allergies, current medications, past family history, past medical history, past social history,  past surgical history and problem list.  Review of Systems  ROS- negative except as noted above. Information obtained from patient.    Objective:   BP (!) 143/85   Pulse 75   Ht 5\' 5"  (1.651 m)   Wt 139 lb 5 oz (63.2 kg)   LMP 08/07/2019 (Exact Date)   Breastfeeding Yes   BMI 23.18 kg/m    CONSTITUTIONAL: Well-developed, well-nourished female in no acute distress.   PSYCHIATRIC: Normal mood and affect. Normal behavior. Normal judgment and thought content.  [place]: Alert and oriented to person, place, and time. Normal muscle tone coordination. No cranial nerve deficit noted.  HENT:  Normocephalic, atraumatic, External right and left ear normal. Oropharynx is clear and moist  EYES: Conjunctivae and EOM are normal. No scleral icterus.   NECK: Normal range of motion, supple, no masses.  Normal thyroid.   SKIN: Skin is warm and dry. No rash noted. Not diaphoretic. No erythema. No pallor.  CARDIOVASCULAR: Normal heart rate noted, regular rhythm, no murmur.  RESPIRATORY: Clear to auscultation bilaterally. Effort and breath sounds normal, no problems with respiration noted.  BREASTS: Lactating breast. Symmetric in size. No masses, skin changes, nipple drainage, or lymphadenopathy.  ABDOMEN: Soft, normal bowel sounds, no distention noted.  No tenderness, rebound or guarding.   PELVIC:  External Genitalia: Normal  Vagina: Normal  Uterus: Normal  Adnexa: Normal   MUSCULOSKELETAL: Normal range of motion. No tenderness.  No cyanosis, clubbing, or edema.  2+ distal pulses.  LYMPHATIC: No Axillary, Supraclavicular, or Inguinal Adenopathy.  Assessment:   Annual gynecologic examination 39 y.o.   Contraception: Lactating mother and NFP   Normal BMI   Problem List Items Addressed This Visit    None    Visit Diagnoses    Well woman exam with routine gynecological exam    -  Primary   Birth control counseling          Plan:  Pap: Not needed   Mammogram: Not  Indicated   Labs: Patient declined.  Education given regarding options for contraception, including barrier methods, injectable contraception, IUD placement, oral contraceptives, Implant, Vaginal ring, and patch.  Routine preventative health maintenance measures emphasized: Exercise/Diet/Weight control, Tobacco Warnings, Alcohol/Substance use risks and Stress Management. See AVS.   RTC x 1 year for ANNUAL EXAM or sooner if needed  [place] 08/15/19 3:49 PM

## 2019-08-16 NOTE — Progress Notes (Signed)
I have seen, interviewed, and examined the patient in conjunction with the [employer] Nurse Practitioner student and affirm the diagnosis and management plan.   Gunnar Bulla, CNM [facility], [facility] 08/16/19 9:17 AM

## 2019-09-12 ENCOUNTER — Telehealth: Payer: Self-pay

## 2019-09-12 NOTE — Telephone Encounter (Signed)
mychart message sent to patient- papers are ready for pickup.

## 2019-09-18 ENCOUNTER — Ambulatory Visit (INDEPENDENT_AMBULATORY_CARE_PROVIDER_SITE_OTHER): Payer: Self-pay

## 2019-09-18 ENCOUNTER — Other Ambulatory Visit: Payer: Self-pay

## 2019-09-18 ENCOUNTER — Encounter: Payer: Medicaid Other | Admitting: Family Medicine

## 2019-09-18 DIAGNOSIS — Z111 Encounter for screening for respiratory tuberculosis: Secondary | ICD-10-CM

## 2019-09-18 NOTE — Progress Notes (Signed)
Tb placement 

## 2019-09-20 ENCOUNTER — Encounter: Payer: Self-pay | Admitting: Family Medicine

## 2019-09-20 ENCOUNTER — Other Ambulatory Visit: Payer: Self-pay

## 2019-09-20 ENCOUNTER — Ambulatory Visit (INDEPENDENT_AMBULATORY_CARE_PROVIDER_SITE_OTHER): Payer: Medicaid Other | Admitting: Family Medicine

## 2019-09-20 VITALS — BP 120/64 | HR 76 | Ht 65.0 in | Wt 140.0 lb

## 2019-09-20 DIAGNOSIS — Z0289 Encounter for other administrative examinations: Secondary | ICD-10-CM | POA: Diagnosis not present

## 2019-09-20 LAB — TB SKIN TEST
Induration: 0 mm
TB Skin Test: NEGATIVE

## 2019-09-20 NOTE — Progress Notes (Addendum)
Date:  09/20/2019   Name:  Jamie Dudley   DOB:  March 26, 1980   MRN:  [id number]   Chief Complaint: work form (tb negative)  Patient is a 39 year old female who presents for a school employment exam. The patient reports the following problems: none. Health maintenance has been reviewed pap ?2023   Lab Results  Component Value Date   CREATININE 0.83 12/03/2018   BUN 14 12/03/2018   NA 141 12/03/2018   K 4.7 12/03/2018   CL 106 12/03/2018   CO2 23 12/03/2018   Lab Results  Component Value Date   CHOL 225 (H) 09/18/2017   HDL 61 09/18/2017   LDLCALC 147 (H) 09/18/2017   TRIG 85 09/18/2017   CHOLHDL 3.7 09/18/2017   Lab Results  Component Value Date   TSH 2.830 01/25/2017   No results found for: HGBA1C Lab Results  Component Value Date   WBC 6.2 12/03/2018   HGB 13.4 12/03/2018   HCT 40.1 12/03/2018   MCV 89 12/03/2018   PLT 248 12/03/2018   Lab Results  Component Value Date   ALT 16 12/03/2018   AST 13 12/03/2018   ALKPHOS 94 12/03/2018   BILITOT 0.4 12/03/2018     Review of Systems  Constitutional: Negative.  Negative for chills, fatigue, fever and unexpected weight change.  HENT: Negative for congestion, ear discharge, ear pain, rhinorrhea, sinus pressure, sneezing and sore throat.   Eyes: Negative for photophobia, pain, discharge, redness and itching.  Respiratory: Negative for cough, shortness of breath, wheezing and stridor.   Gastrointestinal: Negative for abdominal pain, blood in stool, constipation, diarrhea, nausea and vomiting.  Endocrine: Negative for cold intolerance, heat intolerance, polydipsia, polyphagia and polyuria.  Genitourinary: Negative for dysuria, flank pain, frequency, hematuria, menstrual problem, pelvic pain, urgency, vaginal bleeding and vaginal discharge.  Musculoskeletal: Negative for arthralgias, back pain and myalgias.  Skin: Negative for rash.  Allergic/Immunologic: Negative for environmental allergies and food allergies.    Neurological: Negative for dizziness, weakness, light-headedness, numbness and headaches.  Hematological: Negative for adenopathy. Does not bruise/bleed easily.  Psychiatric/Behavioral: Negative for dysphoric mood. The patient is not nervous/anxious.     Patient Active Problem List   Diagnosis Date Noted  . MDD (major depressive disorder), recurrent episode, moderate (HCC) 09/18/2017    No Known Allergies  Past Surgical History:  Procedure Laterality Date  . COLPOSCOPY    . WISDOM TOOTH EXTRACTION      Social History   Tobacco Use  . Smoking status: Never Smoker  . Smokeless tobacco: Never Used  Vaping Use  . Vaping Use: Never used  Substance Use Topics  . Alcohol use: Not Currently    Comment: rare  . Drug use: No     Medication list has been reviewed and updated.  Current Meds  Medication Sig  . acetaminophen (TYLENOL) 325 MG tablet Take 2 tablets (650 mg total) by mouth every 4 (four) hours as needed (for pain scale < 4).  . ibuprofen (ADVIL) 600 MG tablet Take 1 tablet (600 mg total) by mouth every 6 (six) hours.  . Prenatal Vit-Fe Fumarate-FA (PRENATAL MULTIVITAMIN) TABS tablet Take 1 tablet by mouth daily at 12 noon.  . triamcinolone cream (KENALOG) 0.1 % Apply 1 application topically 2 (two) times daily.    PHQ 2/9 Scores 09/20/2019 05/01/2019 12/14/2018 12/03/2018  PHQ - 2 Score 0 0 0 0  PHQ- 9 Score 0 0 2 2    GAD 7 : Generalized Anxiety Score  09/20/2019 05/01/2019 12/14/2018 12/03/2018  Nervous, Anxious, on Edge 0 0 0 0  Control/stop worrying 0 0 0 0  Worry too much - different things 0 0 0 0  Trouble relaxing 0 0 0 0  Restless 0 0 3 1  Easily annoyed or irritable 0 0 0 0  Afraid - awful might happen 0 0 0 0  Total GAD 7 Score 0 0 3 1  Anxiety Difficulty - - Not difficult at all Not difficult at all    BP Readings from Last 3 Encounters:  09/20/19 120/64  08/15/19 (!) 143/85  08/05/19 130/87    Physical Exam Vitals and nursing note reviewed.   Constitutional:      Appearance: She is well-developed.  HENT:     Head: Normocephalic.     Right Ear: Tympanic membrane, ear canal and external ear normal.     Left Ear: Tympanic membrane, ear canal and external ear normal.     Nose: Nose normal.     Mouth/Throat:     Mouth: Mucous membranes are moist.  Eyes:     General: Lids are everted, no foreign bodies appreciated. No scleral icterus.       Left eye: No foreign body or hordeolum.     Conjunctiva/sclera: Conjunctivae normal.     Right eye: Right conjunctiva is not injected.     Left eye: Left conjunctiva is not injected.     Pupils: Pupils are equal, round, and reactive to light.  Neck:     Thyroid: No thyromegaly.     Vascular: No JVD.     Trachea: No tracheal deviation.  Cardiovascular:     Rate and Rhythm: Normal rate and regular rhythm.     Heart sounds: Normal heart sounds, S1 normal and S2 normal. No murmur heard.  No systolic murmur is present.  No diastolic murmur is present.  No friction rub. No gallop. No S3 or S4 sounds.   Pulmonary:     Effort: Pulmonary effort is normal. No respiratory distress.     Breath sounds: Normal breath sounds. No wheezing, rhonchi or rales.  Abdominal:     General: Bowel sounds are normal.     Palpations: Abdomen is soft. There is no mass.     Tenderness: There is no abdominal tenderness. There is no guarding or rebound.  Musculoskeletal:        General: No tenderness. Normal range of motion.     Cervical back: Normal range of motion and neck supple.     Right lower leg: 1+ Pitting Edema present.     Left lower leg: 1+ Pitting Edema present.  Lymphadenopathy:     Cervical: No cervical adenopathy.  Skin:    General: Skin is warm.     Findings: No rash.  Neurological:     Mental Status: She is alert and oriented to person, place, and time.     Cranial Nerves: No cranial nerve deficit.     Deep Tendon Reflexes: Reflexes normal.  Psychiatric:        Mood and Affect: Mood is not  anxious or depressed.     Wt Readings from Last 3 Encounters:  09/20/19 140 lb (63.5 kg)  08/15/19 139 lb 5 oz (63.2 kg)  08/05/19 140 lb 14.4 oz (63.9 kg)    BP 120/64   Pulse 76   Ht 5\' 5"  (1.651 m)   Wt 140 lb (63.5 kg)   BMI 23.30 kg/m   Assessment and Plan:  1. Encounter for physical examination related to employment No subjective/objective concerns noted during history and physical exam.  Patient previous encounters were reviewed as well as most recent labs, most recent imaging, and care everywhere.  Patient had tuberculin skin test placed and will be evaluated.  Tuberculin skin test was placed 48 hours earlier and was read as negative.

## 2019-10-21 ENCOUNTER — Other Ambulatory Visit: Payer: Self-pay

## 2019-10-21 ENCOUNTER — Ambulatory Visit
Admission: RE | Admit: 2019-10-21 | Discharge: 2019-10-21 | Disposition: A | Discharge status: Home / Self Care | Payer: Medicaid Other | Source: Ambulatory Visit | Attending: Family Medicine | Admitting: Family Medicine

## 2019-10-21 VITALS — BP 124/87 | HR 100 | Temp 99.3°F | Resp 18 | Ht 65.0 in | Wt 135.0 lb

## 2019-10-21 DIAGNOSIS — R55 Syncope and collapse: Secondary | ICD-10-CM | POA: Diagnosis not present

## 2019-10-21 LAB — COMPREHENSIVE METABOLIC PANEL
ALT: 14 U/L (ref 0–44)
AST: 15 U/L (ref 15–41)
Albumin: 4.5 g/dL (ref 3.5–5.0)
Alkaline Phosphatase: 52 U/L (ref 38–126)
Anion gap: 7 (ref 5–15)
BUN: 20 mg/dL (ref 6–20)
CO2: 27 mmol/L (ref 22–32)
Calcium: 8.4 mg/dL — ABNORMAL LOW (ref 8.9–10.3)
Chloride: 105 mmol/L (ref 98–111)
Creatinine, Ser: 0.72 mg/dL (ref 0.44–1.00)
GFR calc Af Amer: >60 mL/min (ref >60)
GFR calc non Af Amer: >60 mL/min (ref >60)
Glucose, Bld: 97 mg/dL (ref 70–99)
Potassium: 3.4 mmol/L — ABNORMAL LOW (ref 3.5–5.1)
Sodium: 139 mmol/L (ref 135–145)
Total Bilirubin: 1.5 mg/dL — ABNORMAL HIGH (ref 0.3–1.2)
Total Protein: 7.6 g/dL (ref 6.5–8.1)

## 2019-10-21 LAB — CBC WITH DIFFERENTIAL/PLATELET
Abs Immature Granulocytes: 0.03 10*3/uL (ref 0.00–0.07)
Basophils Absolute: 0 10*3/uL (ref 0.0–0.1)
Basophils Relative: 0 %
Eosinophils Absolute: 0 10*3/uL (ref 0.0–0.5)
Eosinophils Relative: 0 %
HCT: 44 % (ref 36.0–46.0)
Hemoglobin: 14.5 g/dL (ref 12.0–15.0)
Immature Granulocytes: 0 %
Lymphocytes Relative: 9 %
Lymphs Abs: 0.7 10*3/uL (ref 0.7–4.0)
MCH: 30.1 pg (ref 26.0–34.0)
MCHC: 33 g/dL (ref 30.0–36.0)
MCV: 91.5 fL (ref 80.0–100.0)
Monocytes Absolute: 0.4 10*3/uL (ref 0.1–1.0)
Monocytes Relative: 5 %
Neutro Abs: 6.1 10*3/uL (ref 1.7–7.7)
Neutrophils Relative %: 86 %
Platelets: 227 10*3/uL (ref 150–400)
RBC: 4.81 MIL/uL (ref 3.87–5.11)
RDW: 12.6 % (ref 11.5–15.5)
WBC: 7.1 10*3/uL (ref 4.0–10.5)
nRBC: 0 % (ref 0.0–0.2)

## 2019-10-21 NOTE — Discharge Instructions (Signed)
Labs reassuring.  Stay hydrated.  Follow up with PCP.  Take care  Dr. Adriana Simas

## 2019-10-21 NOTE — ED Triage Notes (Signed)
Patient states that she had a syncopal episode around 330am states that she had fed her son and she was "really worked up" (anxiety). States that husband told her that she was out for about 1 minute. States that she was nausea afterwards but denies hitting her head. States that now she feels better but is tired and has a headache.

## 2019-10-22 NOTE — ED Provider Notes (Signed)
[facility]    CSN: [id number] Arrival date & time: 10/21/19  1615  History   Chief Complaint Chief Complaint  Patient presents with  . Loss of Consciousness  . Appointment   HPI  39 year old female presents for evaluation after suffering a syncopal episode at home.  Patient reports that she got up in the night to feed her child and subsequently passed out.  She states that she was "really worked up".  She believes that anxiety causes her to pass out.  She states that her husband witnessed the episode and states that she was out for about 1 minute.  No preceding nausea.  No preceding shortness of breath or palpitations.  Patient states that she does not feel like she hit her head.  Patient states that subsequently developed nausea and headache.  She reports continued headache and nausea. No relieving factors.  Did not seek medical attention until now.  No other complaints at this time.  Past Medical History:  Diagnosis Date  . Atypical squamous cells of undetermined significance (ASC-US) on cervical Pap smear   . Depression   . Sinusitis   . Skin-picking disorder   . Tendonitis     Patient Active Problem List   Diagnosis Date Noted  . MDD (major depressive disorder), recurrent episode, moderate (HCC) 09/18/2017    Past Surgical History:  Procedure Laterality Date  . COLPOSCOPY    . WISDOM TOOTH EXTRACTION      OB History    Gravida  1   Para  1   Term  1   Preterm  0   AB  0   Living  1     SAB  0   TAB  0   Ectopic  0   Multiple  0   Live Births  1            Home Medications    Prior to Admission medications   Medication Sig Start Date End Date Taking? Authorizing Provider  acetaminophen (TYLENOL) 325 MG tablet Take 2 tablets (650 mg total) by mouth every 4 (four) hours as needed (for pain scale < 4). 11/22/18  Yes Lawhorn, Vanessa Condon, CNM  ibuprofen (ADVIL) 600 MG tablet Take 1 tablet (600 mg total) by mouth every 6 (six)  hours. 11/22/18  Yes Lawhorn, Vanessa Gene Autry, CNM  Prenatal Vit-Fe Fumarate-FA (PRENATAL MULTIVITAMIN) TABS tablet Take 1 tablet by mouth daily at 12 noon.   Yes [provider]  triamcinolone cream (KENALOG) 0.1 % Apply 1 application topically 2 (two) times daily. 12/03/18  Yes Duanne Limerick, MD    Family History Family History  Problem Relation Age of Onset  . Heart disease Paternal Grandfather   . Hypertension Mother   . Breast cancer Neg Hx   . Ovarian cancer Neg Hx   . Colon cancer Neg Hx     Social History Social History   Tobacco Use  . Smoking status: Never Smoker  . Smokeless tobacco: Never Used  Vaping Use  . Vaping Use: Never used  Substance Use Topics  . Alcohol use: Not Currently    Comment: rare  . Drug use: No     Allergies   Patient has no known allergies.   Review of Systems Review of Systems  Constitutional: Negative for fever.  Gastrointestinal: Positive for nausea.  Neurological: Positive for syncope and headaches.   Physical Exam Triage Vital Signs ED Triage Vitals  Enc Vitals Group     BP  10/21/19 1646 124/87     Pulse Rate 10/21/19 1646 100     Resp 10/21/19 1646 18     Temp 10/21/19 1646 99.3 F (37.4 C)     Temp Source 10/21/19 1646 Oral     SpO2 10/21/19 1646 100 %     Weight 10/21/19 1645 135 lb (61.2 kg)     Height 10/21/19 1645 5\' 5"  (1.651 m)     Head Circumference --      Peak Flow --      Pain Score 10/21/19 1644 4     Pain Loc --      Pain Edu? --      Excl. in GC? --    Updated Vital Signs BP 124/87 (BP Location: Right Arm)   Pulse 100   Temp 99.3 F (37.4 C) (Oral)   Resp 18   Ht 5\' 5"  (1.651 m)   Wt 61.2 kg   LMP 10/15/2019   SpO2 100%   Breastfeeding Yes   BMI 22.47 kg/m   Visual Acuity Right Eye Distance:   Left Eye Distance:   Bilateral Distance:    Right Eye Near:   Left Eye Near:    Bilateral Near:     Physical Exam Vitals and nursing note reviewed.  Constitutional:      General:  She is not in acute distress.    Appearance: Normal appearance. She is not ill-appearing.  HENT:     Head: Normocephalic and atraumatic.  Eyes:     General:        Right eye: No discharge.        Left eye: No discharge.     Conjunctiva/sclera: Conjunctivae normal.  Cardiovascular:     Rate and Rhythm: Normal rate and regular rhythm.  Pulmonary:     Effort: Pulmonary effort is normal.     Breath sounds: Normal breath sounds. No wheezing, rhonchi or rales.  Neurological:     General: No focal deficit present.     Mental Status: She is alert and oriented to person, place, and time.     Cranial Nerves: No cranial nerve deficit.     Motor: No weakness.  Psychiatric:     Comments: Flat affect.    UC Treatments / Results  Labs (all labs ordered are listed, but only abnormal results are displayed) Labs Reviewed  COMPREHENSIVE METABOLIC PANEL - Abnormal; Notable for the following components:      Result Value   Potassium 3.4 (*)    Calcium 8.4 (*)    Total Bilirubin 1.5 (*)    All other components within normal limits  CBC WITH DIFFERENTIAL/PLATELET    EKG Interpretation: Normal sinus rhythm with a rate of 77.  Normal axis.  Normal intervals.  No ST or T wave changes.  Normal EKG.   Radiology No results found.  Procedures Procedures (including critical care time)  Medications Ordered in UC Medications - No data to display  Initial Impression / Assessment and Plan / UC Course  I have reviewed the triage vital signs and the nursing notes.  Pertinent labs & imaging results that were available during my care of the patient were reviewed by me and considered in my medical decision making (see chart for details).    39 year old female presents with a syncopal episode.  Her EKG is reassuring.  Laboratory studies reassuring.  Slightly low potassium at 3.4.  Mild elevated bilirubin of unknown clinical significance.  Advised hydration. Follow up with PCP.  Final Clinical  Impressions(s) / UC Diagnoses   Final diagnoses:  Syncope, unspecified syncope type     Discharge Instructions     Labs reassuring.  Stay hydrated.  Follow up with PCP.  Take care  Dr. Adriana Simas    ED Prescriptions    None     PDMP not reviewed this encounter.   Tommie Sams, DO 10/22/19 1256

## 2019-11-12 ENCOUNTER — Encounter: Payer: Self-pay | Admitting: Emergency Medicine

## 2019-11-12 ENCOUNTER — Other Ambulatory Visit: Payer: Self-pay

## 2019-11-12 ENCOUNTER — Ambulatory Visit
Admission: EM | Admit: 2019-11-12 | Discharge: 2019-11-12 | Disposition: A | Discharge status: Home / Self Care | Payer: Medicaid Other | Attending: Emergency Medicine | Admitting: Emergency Medicine

## 2019-11-12 DIAGNOSIS — J069 Acute upper respiratory infection, unspecified: Secondary | ICD-10-CM | POA: Insufficient documentation

## 2019-11-12 DIAGNOSIS — Z20822 Contact with and (suspected) exposure to covid-19: Secondary | ICD-10-CM | POA: Diagnosis present

## 2019-11-12 LAB — GROUP A STREP BY PCR: Group A Strep by PCR: NOT DETECTED

## 2019-11-12 MED ORDER — FLUTICASONE PROPIONATE 50 MCG/ACT NA SUSP
2.0000 | Freq: Every day | NASAL | 0 refills | Status: DC
Start: 2019-11-12 — End: 2023-07-25

## 2019-11-12 MED ORDER — IBUPROFEN 600 MG PO TABS
600.0000 mg | ORAL_TABLET | Freq: Four times a day (QID) | ORAL | 0 refills | Status: DC | PRN
Start: 2019-11-12 — End: 2020-06-13

## 2019-11-12 NOTE — ED Provider Notes (Signed)
HPI  SUBJECTIVE:  Patient reports sore throat starting 2 days ago sx worse with talking.  Sx better with nothing.  Has not tried anything for this. No fever   No neck stiffness  + Mild cough + nasal congestion, rhinorrhea + Myalgias + Headache No Rash + Fatigue  No loss of taste or smell No shortness of breath or difficulty breathing No nausea, vomiting No diarrhea No abdominal pain     No Recent Strep, mono, COVID exposure .Both doses of the Covid vaccine in April No reflux sxs No Allergy sxs  No Breathing difficulty, voice changes, sensation of throat swelling shut No Drooling No Trismus No abx in past month.  No antipyretic in past 4-6 hrs  Past medical history negative for pulmonary disease, frequent strep, mono, diabetes, hypertension, coronary artery disease, chronic kidney disease. LMP: Today PMD: Dr. Yetta Barre She is currently breast-feeding.   Past Medical History:  Diagnosis Date  . Atypical squamous cells of undetermined significance (ASC-US) on cervical Pap smear   . Depression   . Sinusitis   . Skin-picking disorder   . Tendonitis     Past Surgical History:  Procedure Laterality Date  . COLPOSCOPY    . WISDOM TOOTH EXTRACTION      Family History  Problem Relation Age of Onset  . Heart disease Paternal Grandfather   . Hypertension Mother   . Breast cancer Neg Hx   . Ovarian cancer Neg Hx   . Colon cancer Neg Hx     Social History   Tobacco Use  . Smoking status: Never Smoker  . Smokeless tobacco: Never Used  Vaping Use  . Vaping Use: Never used  Substance Use Topics  . Alcohol use: Not Currently    Comment: rare  . Drug use: No    No current facility-administered medications for this encounter.  Current Outpatient Medications:  .  Prenatal Vit-Fe Fumarate-FA (PRENATAL MULTIVITAMIN) TABS tablet, Take 1 tablet by mouth daily at 12 noon., Disp: , Rfl:  .  fluticasone (FLONASE) 50 MCG/ACT nasal spray, Place 2 sprays into both nostrils  daily., Disp: 16 g, Rfl: 0 .  ibuprofen (ADVIL) 600 MG tablet, Take 1 tablet (600 mg total) by mouth every 6 (six) hours as needed., Disp: 30 tablet, Rfl: 0  No Known Allergies   ROS  As noted in HPI.   Physical Exam  BP (!) 130/98 (BP Location: Right Arm)   Temp 98.8 F (37.1 C) (Oral)   Resp 18   Ht 5\' 5"  (1.651 m)   Wt 61.2 kg   LMP 10/15/2019   SpO2 100%   BMI 22.47 kg/m   Constitutional: Well developed, well nourished, no acute distress Eyes:  EOMI, conjunctiva normal bilaterally HENT: Normocephalic, atraumatic,mucus membranes moist. + nasal congestion positive maxillary sinus tenderness.   - erythematous oropharynx - enlarged tonsils - exudates. Uvula midline.  Respiratory: Normal inspiratory effort Cardiovascular: Normal rate, no murmurs, rubs, gallops GI: nondistended, nontender. No appreciable splenomegaly skin: No rash, skin intact Lymph: + Anterior cervical LN.  No posterior cervical lymphadenopathy Musculoskeletal: no deformities Neurologic: Alert & oriented x 3, no focal neuro deficits Psychiatric: Speech and behavior appropriate.  ED Course   Medications - No data to display  Orders Placed This Encounter  Procedures  . SARS CORONAVIRUS 2 (TAT 6-24 HRS) Nasopharyngeal Nasopharyngeal Swab    Standing Status:   Standing    Number of Occurrences:   1    Order Specific Question:   Is this test  for diagnosis or screening    Answer:   Diagnosis of ill patient    Order Specific Question:   Symptomatic for COVID-19 as defined by CDC    Answer:   Yes    Order Specific Question:   Date of Symptom Onset    Answer:   11/11/2019    Order Specific Question:   Hospitalized for COVID-19    Answer:   No    Order Specific Question:   Admitted to ICU for COVID-19    Answer:   No    Order Specific Question:   Previously tested for COVID-19    Answer:   Yes    Order Specific Question:   Resident in a congregate (group) care setting    Answer:   No    Order Specific  Question:   Employed in healthcare setting    Answer:   No    Order Specific Question:   Pregnant    Answer:   No    Order Specific Question:   Has patient completed COVID vaccination(s) (2 doses of Pfizer/Moderna 1 dose of [employer])    Answer:   Unknown  . Group A Strep by PCR    Standing Status:   Standing    Number of Occurrences:   1    Order Specific Question:   Patient immune status    Answer:   Normal    Results for orders placed or performed during the hospital encounter of 11/12/19 (from the past 24 hour(s))  SARS CORONAVIRUS 2 (TAT 6-24 HRS) Nasopharyngeal Nasopharyngeal Swab     Status: Abnormal   Collection Time: 11/12/19  4:40 PM   Specimen: Nasopharyngeal Swab  Result Value Ref Range   SARS Coronavirus 2 POSITIVE (A) NEGATIVE  Group A Strep by PCR     Status: None   Collection Time: 11/12/19  4:40 PM   Specimen: Throat; Sterile Swab  Result Value Ref Range   Group A Strep by PCR NOT DETECTED NOT DETECTED   No results found.  ED Clinical Impression  1. Upper respiratory tract infection, unspecified type   2. Encounter for laboratory testing for COVID-19 virus     ED Assessment/Plan  Covid PCR sent.  Strep negative. Patient home with ibuprofen, Tylenol, Benadryl/Maalox mixture, Mucinex D, Flonase, saline nasal irrigation.. Patient to followup with PMD when necessary, the ER if she gets worse  Covid positive.  She is not a candidate for monoclonal antibody infusion.  Supportive treatment.  Discussed labs,  MDM, plan and followup with patient. Discussed sn/sx that should prompt return to the ED. patient agrees with plan.   Meds ordered this encounter  Medications  . fluticasone (FLONASE) 50 MCG/ACT nasal spray    Sig: Place 2 sprays into both nostrils daily.    Dispense:  16 g    Refill:  0  . ibuprofen (ADVIL) 600 MG tablet    Sig: Take 1 tablet (600 mg total) by mouth every 6 (six) hours as needed.    Dispense:  30 tablet    Refill:  0      *This clinic note was created using Scientist, clinical (histocompatibility and immunogenetics). Therefore, there may be occasional mistakes despite careful proofreading.     Domenick Gong, MD 11/13/19 [phone]

## 2019-11-12 NOTE — ED Triage Notes (Signed)
Patient c/o cough, sore throat, headache, body aches that started yesterday.

## 2019-11-12 NOTE — Discharge Instructions (Addendum)
Your strep was negative.  Your Covid test will be back tomorrow.  We will contact you if it is positive.  In the meantime, Flonase, saline nasal irrigation with a Lloyd Huger med rinse and distilled water as often as you want, Mucinex D unless this dries up your milk supply in which case switch to Mucinex.      1 gram of Tylenol and 600 mg ibuprofen together 3-4 times a day as needed for pain.  Make sure you drink plenty of extra fluids.  Some people find salt water gargles and  Traditional Medic[employer]'s "Throat Coat" tea helpful. Take 5 mL of liquid Benadryl and 5 mL of Maalox. Mix it together, and then hold it in your mouth for as long as you can and then swallow. You may do this 4 times a day.    Go to www.goodrx.com to look up your medications. This will give you a list of where you can find your prescriptions at the most affordable prices. Or ask the pharmacist what the cash price is, or if they have any other discount programs available to help make your medication more affordable. This can be less expensive than what you would pay with insurance.

## 2019-11-13 ENCOUNTER — Telehealth: Payer: Self-pay

## 2019-11-13 LAB — SARS CORONAVIRUS 2 (TAT 6-24 HRS): SARS Coronavirus 2: POSITIVE — AB

## 2019-11-13 NOTE — Telephone Encounter (Signed)
Pt. Calling to verify quarantine recommendations for COVID 19. Pt. Has symptoms. Instructed 10 days from beginning of symptoms. Verbalizes understanding.

## 2020-03-12 ENCOUNTER — Ambulatory Visit (INDEPENDENT_AMBULATORY_CARE_PROVIDER_SITE_OTHER): Payer: Medicaid Other | Admitting: Certified Nurse Midwife

## 2020-03-12 ENCOUNTER — Encounter: Payer: Self-pay | Admitting: Certified Nurse Midwife

## 2020-03-12 ENCOUNTER — Other Ambulatory Visit: Payer: Self-pay

## 2020-03-12 VITALS — BP 126/82 | HR 68 | Ht 65.0 in | Wt 131.8 lb

## 2020-03-12 DIAGNOSIS — N814 Uterovaginal prolapse, unspecified: Secondary | ICD-10-CM

## 2020-03-12 DIAGNOSIS — N812 Incomplete uterovaginal prolapse: Secondary | ICD-10-CM

## 2020-03-12 DIAGNOSIS — N393 Stress incontinence (female) (male): Secondary | ICD-10-CM

## 2020-03-12 NOTE — Progress Notes (Signed)
GYN ENCOUNTER NOTE  Subjective:       Jamie Dudley is a 39 y.o. G53P1001 female is here for gynecologic evaluation of the following issues:  1. Uterine/cervical prolapse 2. Incontinence, "pees when I sneeze" 3. Discharge, "white, sticky discharge that doesn't itch from vagina" 4. "Is it ok to resume sexual activity?"  Denies difficulty breathing, respiratory distress, chest paint, abdominal pain, leg swelling or pain.  Gynecologic History  No LMP recorded. (Menstrual status: Lactating).  Contraception: NFP  Last Pap: 09/08/2016. Results were: Neg/Neg  Obstetric History  OB History  Gravida Para Term Preterm AB Living  1 1 1  0 0 1  SAB IAB Ectopic Multiple Live Births  0 0 0 0 1    # Outcome Date GA Lbr Len/2nd Weight Sex Delivery Anes PTL Lv  1 Term 11/21/18 [redacted]w[redacted]d / 01:09 3400 g M Vag-Spont None  LIV    Past Medical History:  Diagnosis Date  . Atypical squamous cells of undetermined significance (ASC-US) on cervical Pap smear   . Depression   . Sinusitis   . Skin-picking disorder   . Tendonitis     Past Surgical History:  Procedure Laterality Date  . COLPOSCOPY    . WISDOM TOOTH EXTRACTION      Current Outpatient Medications on File Prior to Visit  Medication Sig Dispense Refill  . fluticasone (FLONASE) 50 MCG/ACT nasal spray Place 2 sprays into both nostrils daily. 16 g 0  . ibuprofen (ADVIL) 600 MG tablet Take 1 tablet (600 mg total) by mouth every 6 (six) hours as needed. 30 tablet 0  . Prenatal Vit-Fe Fumarate-FA (PRENATAL MULTIVITAMIN) TABS tablet Take 1 tablet by mouth daily at 12 noon.     No current facility-administered medications on file prior to visit.    No Known Allergies  Social History   Socioeconomic History  . Marital status: Single    Spouse name: Not on file  . Number of children: Not on file  . Years of education: Not on file  . Highest education level: Not on file  Occupational History  . Not on file  Tobacco Use  . Smoking status:  Never Smoker  . Smokeless tobacco: Never Used  Vaping Use  . Vaping Use: Never used  Substance and Sexual Activity  . Alcohol use: Not Currently    Comment: rare  . Drug use: No  . Sexual activity: Not Currently    Birth control/protection: None  Other Topics Concern  . Not on file  Social History Narrative  . Not on file   Social Determinants of Health   Financial Resource Strain: Not on file  Food Insecurity: Not on file  Transportation Needs: Not on file  Physical Activity: Not on file  Stress: Not on file  Social Connections: Not on file  Intimate Partner Violence: Not on file    Family History  Problem Relation Age of Onset  . Heart disease Paternal Grandfather   . Hypertension Mother   . Breast cancer Neg Hx   . Ovarian cancer Neg Hx   . Colon cancer Neg Hx     The following portions of the patient's history were reviewed and updated as appropriate: allergies, current medications, past family history, past medical history, past social history, past surgical history and problem list.  Review of Systems  ROS - Negative other then what is reported above. Information obtained verbally from patient.  Objective:   BP 126/82   Pulse 68   Ht 5\' 5"  (1.651  m)   Wt 59.8 kg   Breastfeeding Yes   BMI 21.93 kg/m    CONSTITUTIONAL: Well-developed, well-nourished female in no acute distress.   ABDOMEN: Soft, non distended; Non tender.  No Organomegaly . PELVIC:  External Genitalia: Normal  Vagina: Normal  Cervix: at opening, mobile  Uterus: Low  Adnexa: Normal   MUSCULOSKELETAL: Normal range of motion. No tenderness.  No cyanosis, clubbing, or edema.   Assessment:   1. Stress incontinence - Ambulatory referral to Physical Therapy  2. Cervical prolapse - Ambulatory referral to Physical Therapy   Plan:   Referral placed for physical therapy.  Patient educated that as long as sexual activity is not painful, it is alright to resume having intercourse.    Patient given information about a pessary or diaphragm use.   Reviewed red flag symptoms and when to call.  RTC if symptoms worsen or fail to improve.    Juliann Pares, Student-[employer]ife [employer] 03/12/20 4:56 PM

## 2020-03-12 NOTE — Progress Notes (Signed)
I have seen, interviewed, and examined the patient in conjunction with the [employer] and affirm the diagnosis and management plan.   Gunnar Bulla, CNM [facility] 03/12/20 5:22 PM

## 2020-03-12 NOTE — Patient Instructions (Addendum)
Pelvic Organ Prolapse Pelvic organ prolapse is the stretching, bulging, or dropping of pelvic organs into an abnormal position. It happens when the muscles and tissues that surround and support pelvic structures become weak or stretched. Pelvic organ prolapse can involve the:  Vagina (vaginal prolapse).  Uterus (uterine prolapse).  Bladder (cystocele).  Rectum (rectocele).  Intestines (enterocele). When organs other than the vagina are involved, they often bulge into the vagina or protrude from the vagina, depending on how severe the prolapse is. What are the causes? This condition may be caused by:  Pregnancy, labor, and childbirth.  Past pelvic surgery.  Decreased production of the hormone estrogen associated with menopause.  Consistently lifting more than 50 lb (23 kg).  Obesity.  Long-term inability to pass stool (chronic constipation).  A cough that lasts a long time (chronic).  Buildup of fluid in the abdomen due to certain diseases and other conditions. What are the signs or symptoms? Symptoms of this condition include:  Passing a little urine (loss of bladder control) when you cough, sneeze, strain, and exercise (stress incontinence). This may be worse immediately after childbirth. It may gradually improve over time.  Feeling pressure in your pelvis or vagina. This pressure may increase when you cough or when you are passing stool.  A bulge that protrudes from the opening of your vagina.  Difficulty passing urine or stool.  Pain in your lower back.  Pain, discomfort, or disinterest in sex.  Repeated bladder infections (urinary tract infections).  Difficulty inserting a tampon. In some people, this condition causes no symptoms. How is this diagnosed? This condition may be diagnosed based on a vaginal and rectal exam. During the exam, you may be asked to cough and strain while you are lying down, sitting, and standing up. Your health care provider will  determine if other tests are required, such as bladder function tests. How is this treated? Treatment for this condition may depend on your symptoms. Treatment may include:  Lifestyle changes, such as changes to your diet.  Emptying your bladder at scheduled times (bladder training therapy). This can help reduce or avoid urinary incontinence.  Estrogen. Estrogen may help mild prolapse by increasing the strength and tone of pelvic floor muscles.  Kegel exercises. These may help mild cases of prolapse by strengthening and tightening the muscles of the pelvic floor.  A soft, flexible device that helps support the vaginal walls and keep pelvic organs in place (pessary). This is inserted into your vagina by your health care provider.  Surgery. This is often the only form of treatment for severe prolapse. Follow these instructions at home:  Avoid drinking beverages that contain caffeine or alcohol.  Increase your intake of high-fiber foods. This can help decrease constipation and straining during bowel movements.  Lose weight if recommended by your health care provider.  Wear a sanitary pad or adult diapers if you have urinary incontinence.  Avoid heavy lifting and straining with exercise and work. Do not hold your breath when you perform mild to moderate lifting and exercise activities. Limit your activities as directed by your health care provider.  Do Kegel exercises as directed by your health care provider. To do this: ? Squeeze your pelvic floor muscles tight. You should feel a tight lift in your rectal area and a tightness in your vaginal area. Keep your stomach, buttocks, and legs relaxed. ? Hold the muscles tight for up to 10 seconds. ? Relax your muscles. ? Repeat this exercise 50 times a day,   or as many times as told by your health care provider. Continue to do this exercise for at least 4-6 weeks, or for as long as told by your health care provider.  Take over-the-counter and  prescription medicines only as told by your health care provider.  If you have a pessary, take care of it as told by your health care provider.  Keep all follow-up visits as told by your health care provider. This is important. Contact a health care provider if you:  Have symptoms that interfere with your daily activities or sex life.  Need medicine to help with the discomfort.  Notice bleeding from your vagina that is not related to your period.  Have a fever.  Have pain or bleeding when you urinate.  Have bleeding when you pass stool.  Pass urine when you have sex.  Have chronic constipation.  Have a pessary that falls out.  Have bad smelling vaginal discharge.  Have an unusual, low pain in your abdomen. Summary  Pelvic organ prolapse is the stretching, bulging, or dropping of pelvic organs into an abnormal position. It happens when the muscles and tissues that surround and support pelvic structures become weak or stretched.  When organs other than the vagina are involved, they often bulge into the vagina or protrude from the vagina, depending on how severe the prolapse is.  In most cases, this condition needs to be treated only if it produces symptoms. Treatment may include lifestyle changes, estrogen, Kegel exercises, pessary insertion, or surgery.  Avoid heavy lifting and straining with exercise and work. Do not hold your breath when you perform mild to moderate lifting and exercise activities. Limit your activities as directed by your health care provider. This information is not intended to replace advice given to you by your health care provider. Make sure you discuss any questions you have with your health care provider. Document Revised: 03/22/2017 Document Reviewed: 03/22/2017 Elsevier Patient Education  2020 [employer].   Urinary Incontinence  Urinary incontinence refers to a condition in which a person is unable to control where and when to pass urine. A  person with this condition will urinate when he or she does not mean to (involuntarily). What are the causes? This condition may be caused by:  Medicines.  Infections.  Constipation.  Overactive bladder muscles.  Weak bladder muscles.  Weak pelvic floor muscles. These muscles provide support for the bladder, intestine, and, in women, the uterus.  Enlarged prostate in men. The prostate is a gland near the bladder. When it gets too big, it can pinch the urethra. With the urethra blocked, the bladder can weaken and lose the ability to empty properly.  Surgery.  Emotional factors, such as anxiety, stress, or post-traumatic stress disorder (PTSD).  Pelvic organ prolapse. This happens in women when organs shift out of place and into the vagina. This shift can prevent the bladder and urethra from working properly. What increases the risk? The following factors may make you more likely to develop this condition:  Older age.  Obesity and physical inactivity.  Pregnancy and childbirth.  Menopause.  Diseases that affect the nerves or spinal cord (neurological diseases).  Long-term (chronic) coughing. This can increase pressure on the bladder and pelvic floor muscles. What are the signs or symptoms? Symptoms may vary depending on the type of urinary incontinence you have. They include:  A sudden urge to urinate, but passing urine involuntarily before you can get to a bathroom (urge incontinence).  Suddenly passing  urine with any activity that forces urine to pass, such as coughing, laughing, exercise, or sneezing (stress incontinence).  Needing to urinate often, but urinating only a small amount, or constantly dribbling urine (overflow incontinence).  Urinating because you cannot get to the bathroom in time due to a physical disability, such as arthritis or injury, or communication and thinking problems, such as Alzheimer disease (functional incontinence). How is this  diagnosed? This condition may be diagnosed based on:  Your medical history.  A physical exam.  Tests, such as: ? Urine tests. ? X-rays of your kidney and bladder. ? Ultrasound. ? CT scan. ? Cystoscopy. In this procedure, a health care provider inserts a tube with a light and camera (cystoscope) through the urethra and into the bladder in order to check for problems. ? Urodynamic testing. These tests assess how well the bladder, urethra, and sphincter can store and release urine. There are different types of urodynamic tests, and they vary depending on what the test is measuring. To help diagnose your condition, your health care provider may recommend that you keep a log of when you urinate and how much you urinate. How is this treated? Treatment for this condition depends on the type of incontinence that you have and its cause. Treatment may include:  Lifestyle changes, such as: ? Quitting smoking. ? Maintaining a healthy weight. ? Staying active. Try to get 150 minutes of moderate-intensity exercise every week. Ask your health care provider which activities are safe for you. ? Eating a healthy diet.  Avoid high-fat foods, like fried foods.  Avoid refined carbohydrates like white bread and white rice.  Limit how much alcohol and caffeine you drink.  Increase your fiber intake. Foods such as fresh fruits, vegetables, beans, and whole grains are healthy sources of fiber.  Pelvic floor muscle exercises.  Bladder training, such as lengthening the amount of time between bathroom breaks, or using the bathroom at regular intervals.  Using techniques to suppress bladder urges. This can include distraction techniques or controlled breathing exercises.  Medicines to relax the bladder muscles and prevent bladder spasms.  Medicines to help slow or prevent the growth of a man's prostate.  Botox injections. These can help relax the bladder muscles.  Using pulses of electricity to help  change bladder reflexes (electrical nerve stimulation).  For women, using a medical device to prevent urine leaks. This is a small, tampon-like, disposable device that is inserted into the urethra.  Injecting collagen or carbon beads (bulking agents) into the urinary sphincter. These can help thicken tissue and close the bladder opening.  Surgery. Follow these instructions at home: Lifestyle  Limit alcohol and caffeine. These can fill your bladder quickly and irritate it.  Keep yourself clean to help prevent odors and skin damage. Ask your doctor about special skin creams and cleansers that can protect the skin from urine.  Consider wearing pads or adult diapers. Make sure to change them regularly, and always change them right after experiencing incontinence. General instructions  Take over-the-counter and prescription medicines only as told by your health care provider.  Use the bathroom about every 3-4 hours, even if you do not feel the need to urinate. Try to empty your bladder completely every time. After urinating, wait a minute. Then try to urinate again.  Make sure you are in a relaxed position while urinating.  If your incontinence is caused by nerve problems, keep a log of the medicines you take and the times you go to the  bathroom.  Keep all follow-up visits as told by your health care provider. This is important. Contact a health care provider if:  You have pain that gets worse.  Your incontinence gets worse. Get help right away if:  You have a fever or chills.  You are unable to urinate.  You have redness in your groin area or down your legs. Summary  Urinary incontinence refers to a condition in which a person is unable to control where and when to pass urine.  This condition may be caused by medicines, infection, weak bladder muscles, weak pelvic floor muscles, enlargement of the prostate (in men), or surgery.  The following factors increase your risk for  developing this condition: older age, obesity, pregnancy and childbirth, menopause, neurological diseases, and chronic coughing.  There are several types of urinary incontinence. They include urge incontinence, stress incontinence, overflow incontinence, and functional incontinence.  This condition is usually treated first with lifestyle and behavioral changes, such as quitting smoking, eating a healthier diet, and doing regular pelvic floor exercises. Other treatment options include medicines, bulking agents, medical devices, electrical nerve stimulation, or surgery. This information is not intended to replace advice given to you by your health care provider. Make sure you discuss any questions you have with your health care provider. Document Revised: 03/10/2017 Document Reviewed: 06/09/2016 Elsevier Patient Education  2020 [employer].

## 2020-03-23 ENCOUNTER — Other Ambulatory Visit: Payer: Medicaid Other

## 2020-03-23 DIAGNOSIS — Z20822 Contact with and (suspected) exposure to covid-19: Secondary | ICD-10-CM

## 2020-03-24 LAB — NOVEL CORONAVIRUS, NAA: SARS-CoV-2, NAA: NOT DETECTED

## 2020-03-24 LAB — SARS-COV-2, NAA 2 DAY TAT

## 2020-06-13 ENCOUNTER — Encounter: Payer: Self-pay | Admitting: Emergency Medicine

## 2020-06-13 ENCOUNTER — Ambulatory Visit
Admission: EM | Admit: 2020-06-13 | Discharge: 2020-06-13 | Disposition: A | Discharge status: Home / Self Care | Payer: BC Managed Care – PPO | Attending: Physician Assistant | Admitting: Physician Assistant

## 2020-06-13 ENCOUNTER — Other Ambulatory Visit: Payer: Self-pay

## 2020-06-13 DIAGNOSIS — R059 Cough, unspecified: Secondary | ICD-10-CM

## 2020-06-13 DIAGNOSIS — Z20822 Contact with and (suspected) exposure to covid-19: Secondary | ICD-10-CM | POA: Diagnosis not present

## 2020-06-13 DIAGNOSIS — J309 Allergic rhinitis, unspecified: Secondary | ICD-10-CM | POA: Insufficient documentation

## 2020-06-13 DIAGNOSIS — R0981 Nasal congestion: Secondary | ICD-10-CM | POA: Diagnosis not present

## 2020-06-13 MED ORDER — PSEUDOEPH-BROMPHEN-DM 30-2-10 MG/5ML PO SYRP: 10.0000 mL | ORAL_SOLUTION | Freq: Four times a day (QID) | ORAL | 0 refills | Status: AC | PRN

## 2020-06-13 MED ORDER — IBUPROFEN 800 MG PO TABS
800.0000 mg | ORAL_TABLET | Freq: Three times a day (TID) | ORAL | 0 refills | Status: AC | PRN
Start: 2020-06-13 — End: 2020-06-20

## 2020-06-13 NOTE — Discharge Instructions (Signed)

## 2020-06-13 NOTE — ED Provider Notes (Signed)
[facility]    CSN: [id number] Arrival date & time: 06/13/20  2353      History   Chief Complaint Chief Complaint  Patient presents with  . Sinus Problem  . Nasal Congestion    HPI Jamie Dudley is a 40 y.o. female presenting for 2-day history of headache, sinus congestion and pressure, nasal congestion and runny nose, and cough.  She denies fever, fatigue, achiness, ear pain, dizziness, sore throat, chest discomfort, breathing difficulty, abdominal pain, nausea/vomiting/diarrhea.  Patient says her son has similar symptoms.  She does have a history of allergies.  She has been using Flonase which has improved the congestion.  She is also taking Tylenol Cold and sinus which temporarily helps as well.  She denies any known exposure to COVID-19 or influenza.  She is fully vaccinated for COVID-19.  She has no other complaints or concerns today.  HPI  Past Medical History:  Diagnosis Date  . Atypical squamous cells of undetermined significance (ASC-US) on cervical Pap smear   . Depression   . Sinusitis   . Skin-picking disorder   . Tendonitis     Patient Active Problem List   Diagnosis Date Noted  . MDD (major depressive disorder), recurrent episode, moderate (HCC) 09/18/2017    Past Surgical History:  Procedure Laterality Date  . COLPOSCOPY    . WISDOM TOOTH EXTRACTION      OB History    Gravida  1   Para  1   Term  1   Preterm  0   AB  0   Living  1     SAB  0   IAB  0   Ectopic  0   Multiple  0   Live Births  1            Home Medications    Prior to Admission medications   Medication Sig Start Date End Date Taking? Authorizing Provider  brompheniramine-pseudoephedrine-DM 30-2-10 MG/5ML syrup Take 10 mLs by mouth 4 (four) times daily as needed for up to 7 days. 06/13/20 06/20/20 Yes Eusebio Friendly B, PA-C  fluticasone (FLONASE) 50 MCG/ACT nasal spray Place 2 sprays into both nostrils daily. 11/12/19  Yes Domenick Gong, MD  ibuprofen  (ADVIL) 800 MG tablet Take 1 tablet (800 mg total) by mouth every 8 (eight) hours as needed for up to 7 days for headache. 06/13/20 06/20/20 Yes Shirlee Latch, PA-C  Prenatal Vit-Fe Fumarate-FA (PRENATAL MULTIVITAMIN) TABS tablet Take 1 tablet by mouth daily at 12 noon.   Yes [provider]    Family History Family History  Problem Relation Age of Onset  . Heart disease Paternal Grandfather   . Hypertension Mother   . Breast cancer Neg Hx   . Ovarian cancer Neg Hx   . Colon cancer Neg Hx     Social History Social History   Tobacco Use  . Smoking status: Never Smoker  . Smokeless tobacco: Never Used  Vaping Use  . Vaping Use: Never used  Substance Use Topics  . Alcohol use: Not Currently    Comment: rare  . Drug use: No     Allergies   Patient has no known allergies.   Review of Systems Review of Systems  Constitutional: Negative for chills, diaphoresis, fatigue and fever.  HENT: Positive for congestion, rhinorrhea and sinus pressure. Negative for ear pain and sore throat.   Respiratory: Positive for cough. Negative for shortness of breath.   Gastrointestinal: Negative for abdominal pain, nausea  and vomiting.  Musculoskeletal: Negative for arthralgias and myalgias.  Skin: Negative for rash.  Neurological: Positive for headaches. Negative for weakness.  Hematological: Negative for adenopathy.     Physical Exam Triage Vital Signs ED Triage Vitals  Enc Vitals Group     BP 06/13/20 0943 (!) 126/98     Pulse Rate 06/13/20 0943 65     Resp 06/13/20 0943 14     Temp 06/13/20 0943 98.8 F (37.1 C)     Temp Source 06/13/20 0943 Oral     SpO2 06/13/20 0943 100 %     Weight 06/13/20 0939 135 lb (61.2 kg)     Height 06/13/20 0939 5\' 5"  (1.651 m)     Head Circumference --      Peak Flow --      Pain Score 06/13/20 0939 6     Pain Loc --      Pain Edu? --      Excl. in GC? --    No data found.  Updated Vital Signs BP (!) 126/98 (BP Location: Left Arm)    Pulse 65   Temp 98.8 F (37.1 C) (Oral)   Resp 14   Ht 5\' 5"  (1.651 m)   Wt 135 lb (61.2 kg)   LMP 05/30/2020 (Approximate)   SpO2 100%   Breastfeeding Yes   BMI 22.47 kg/m       Physical Exam Vitals and nursing note reviewed.  Constitutional:      General: She is not in acute distress.    Appearance: Normal appearance. She is not ill-appearing or toxic-appearing.  HENT:     Head: Normocephalic and atraumatic.     Right Ear: Tympanic membrane, ear canal and external ear normal.     Left Ear: Tympanic membrane, ear canal and external ear normal.     Nose: Mucosal edema, congestion and rhinorrhea present.     Mouth/Throat:     Mouth: Mucous membranes are moist.     Pharynx: Oropharynx is clear. Posterior oropharyngeal erythema (mild with clear PND) present.  Eyes:     General: No scleral icterus.       Right eye: No discharge.        Left eye: No discharge.     Conjunctiva/sclera: Conjunctivae normal.  Cardiovascular:     Rate and Rhythm: Normal rate and regular rhythm.     Heart sounds: Normal heart sounds.  Pulmonary:     Effort: Pulmonary effort is normal. No respiratory distress.     Breath sounds: Normal breath sounds.  Musculoskeletal:     Cervical back: Neck supple.  Skin:    General: Skin is dry.  Neurological:     General: No focal deficit present.     Mental Status: She is alert. Mental status is at baseline.     Motor: No weakness.     Gait: Gait normal.  Psychiatric:        Mood and Affect: Mood normal.        Behavior: Behavior normal.        Thought Content: Thought content normal.      UC Treatments / Results  Labs (all labs ordered are listed, but only abnormal results are displayed) Labs Reviewed  SARS CORONAVIRUS 2 (TAT 6-24 HRS)    EKG   Radiology No results found.  Procedures Procedures (including critical care time)  Medications Ordered in UC Medications - No data to display  Initial Impression / Assessment and Plan / UC  Course  I have reviewed the triage vital signs and the nursing notes.  Pertinent labs & imaging results that were available during my care of the patient were reviewed by me and considered in my medical decision making (see chart for details).    40 y/o female presenting for 2 day history of headaches, nasal congestion/runny nose and mild cough.   All VSS. Exam significant for nasal congestion, mucosal edema and clear rhinorrhea. Mild posterior pharyngeal erythema with clear PND. Chest is CTA and heart RRR.   Suspect allergies vs viral illness (since son has similar symptoms). Treating at this time with continuing Flonase and I added Bromfed DM and ibuprofen 800 mg tab TID for headaches.  Encouraged increased rest and fluids.  Covid test was obtained.  Current CDC guidelines, isolation protocol and ED precautions reviewed patient.  Advised for her to follow-up as needed with our department for any worsening symptoms or if not improved after 10 days.   Final Clinical Impressions(s) / UC Diagnoses   Final diagnoses:  Allergic rhinitis, unspecified seasonality, unspecified trigger  Nasal congestion  Cough     Discharge Instructions     You have received COVID testing today either for positive exposure, concerning symptoms that could be related to COVID infection, screening purposes, or re-testing after confirmed positive.  Your test obtained today checks for active viral infection in the last 1-2 weeks. If your test is negative now, you can still test positive later. So, if you do develop symptoms you should either get re-tested and/or isolate x 5 days and then strict mask use x 5 days (unvaccinated) or mask use x 10 days (vaccinated). Please follow CDC guidelines.  While Rapid antigen tests come back in 15-20 minutes, send out PCR/molecular test results typically come back within 1-3 days. In the mean time, if you are symptomatic, assume this could be a positive test and treat/monitor  yourself as if you do have COVID.   We will call with test results if positive. Please download the MyChart app and set up a profile to access test results.   If symptomatic, go home and rest. Push fluids. Take Tylenol as needed for discomfort. Gargle warm salt water. Throat lozenges. Take Mucinex DM or Robitussin for cough. Humidifier in bedroom to ease coughing. Warm showers. Also review the COVID handout for more information.  COVID-19 INFECTION: The incubation period of COVID-19 is approximately 14 days after exposure, with most symptoms developing in roughly 4-5 days. Symptoms may range in severity from mild to critically severe. Roughly 80% of those infected will have mild symptoms. People of any age may become infected with COVID-19 and have the ability to transmit the virus. The most common symptoms include: fever, fatigue, cough, body aches, headaches, sore throat, nasal congestion, shortness of breath, nausea, vomiting, diarrhea, changes in smell and/or taste.    COURSE OF ILLNESS Some patients may begin with mild disease which can progress quickly into critical symptoms. If your symptoms are worsening please call ahead to the Emergency Department and proceed there for further treatment. Recovery time appears to be roughly 1-2 weeks for mild symptoms and 3-6 weeks for severe disease.   GO IMMEDIATELY TO ER FOR FEVER YOU ARE UNABLE TO GET DOWN WITH TYLENOL, BREATHING PROBLEMS, CHEST PAIN, FATIGUE, LETHARGY, INABILITY TO EAT OR DRINK, ETC  QUARANTINE AND ISOLATION: To help decrease the spread of COVID-19 please remain isolated if you have COVID infection or are highly suspected to have COVID infection. This means -stay  home and isolate to one room in the home if you live with others. Do not share a bed or bathroom with others while ill, sanitize and wipe down all countertops and keep common areas clean and disinfected. Stay home for 5 days. If you have no symptoms or your symptoms are resolving  after 5 days, you can leave your house. Continue to wear a mask around others for 5 additional days. If you have been in close contact (within 6 feet) of someone diagnosed with COVID 19, you are advised to quarantine in your home for 14 days as symptoms can develop anywhere from 2-14 days after exposure to the virus. If you develop symptoms, you  must isolate.  Most current guidelines for COVID after exposure -unvaccinated: isolate 5 days and strict mask use x 5 days. Test on day 5 is possible -vaccinated: wear mask x 10 days if symptoms do not develop -You do not necessarily need to be tested for COVID if you have + exposure and  develop symptoms. Just isolate at home x10 days from symptom onset During this global pandemic, CDC advises to practice social distancing, try to stay at least 41ft away from others at all times. Wear a face covering. Wash and sanitize your hands regularly and avoid going anywhere that is not necessary.  KEEP IN MIND THAT THE COVID TEST IS NOT 100% ACCURATE AND YOU SHOULD STILL DO EVERYTHING TO PREVENT POTENTIAL SPREAD OF VIRUS TO OTHERS (WEAR MASK, WEAR GLOVES, WASH HANDS AND SANITIZE REGULARLY). IF INITIAL TEST IS NEGATIVE, THIS MAY NOT MEAN YOU ARE DEFINITELY NEGATIVE. MOST ACCURATE TESTING IS DONE 5-7 DAYS AFTER EXPOSURE.   It is not advised by CDC to get re-tested after receiving a positive COVID test since you can still test positive for weeks to months after you have already cleared the virus.   *If you have not been vaccinated for COVID, I strongly suggest you consider getting vaccinated as long as there are no contraindications.      ED Prescriptions    Medication Sig Dispense Auth. Provider   brompheniramine-pseudoephedrine-DM 30-2-10 MG/5ML syrup Take 10 mLs by mouth 4 (four) times daily as needed for up to 7 days. 150 mL Eusebio Friendly B, PA-C   ibuprofen (ADVIL) 800 MG tablet Take 1 tablet (800 mg total) by mouth every 8 (eight) hours as needed for up to 7  days for headache. 21 tablet Shirlee Latch, PA-C     PDMP not reviewed this encounter.   Shirlee Latch, PA-C 06/13/20 1005

## 2020-06-13 NOTE — ED Triage Notes (Signed)
Patient c/o sinus congestion and pressure, HA and stuffy nose that started on Thursday.  Patient denies fevers.

## 2020-06-14 LAB — SARS CORONAVIRUS 2 (TAT 6-24 HRS): SARS Coronavirus 2: NEGATIVE

## 2020-06-18 ENCOUNTER — Encounter: Payer: Self-pay | Admitting: Family Medicine

## 2020-06-18 ENCOUNTER — Other Ambulatory Visit: Payer: Self-pay

## 2020-06-18 ENCOUNTER — Ambulatory Visit (INDEPENDENT_AMBULATORY_CARE_PROVIDER_SITE_OTHER): Payer: BC Managed Care – PPO | Admitting: Family Medicine

## 2020-06-18 VITALS — BP 120/78 | HR 100 | Temp 98.6°F | Ht 65.0 in | Wt 136.0 lb

## 2020-06-18 DIAGNOSIS — J0100 Acute maxillary sinusitis, unspecified: Secondary | ICD-10-CM

## 2020-06-18 MED ORDER — AZITHROMYCIN 250 MG PO TABS: ORAL_TABLET | ORAL | 0 refills | Status: DC

## 2020-06-18 NOTE — Progress Notes (Signed)
Date:  06/18/2020   Name:  Jamie Dudley   DOB:  1980/12/01   MRN:  [id number]   Chief Complaint: Sinusitis (Congestion, fever, chills, clearing throat from drainage)  Sinusitis This is a new problem. The current episode started yesterday. The problem has been gradually worsening since onset. Maximum temperature: low grade/99.5. Her pain is at a severity of 2/10. The pain is mild. Associated symptoms include chills, congestion, coughing, a hoarse voice and sinus pressure. Pertinent negatives include no diaphoresis, ear pain, headaches, neck pain, shortness of breath, sneezing, sore throat or swollen glands. Past treatments include oral decongestants and acetaminophen. The treatment provided moderate relief.    Lab Results  Component Value Date   CREATININE 0.72 10/21/2019   BUN 20 10/21/2019   NA 139 10/21/2019   K 3.4 (L) 10/21/2019   CL 105 10/21/2019   CO2 27 10/21/2019   Lab Results  Component Value Date   CHOL 225 (H) 09/18/2017   HDL 61 09/18/2017   LDLCALC 147 (H) 09/18/2017   TRIG 85 09/18/2017   CHOLHDL 3.7 09/18/2017   Lab Results  Component Value Date   TSH 2.830 01/25/2017   No results found for: HGBA1C Lab Results  Component Value Date   WBC 7.1 10/21/2019   HGB 14.5 10/21/2019   HCT 44.0 10/21/2019   MCV 91.5 10/21/2019   PLT 227 10/21/2019   Lab Results  Component Value Date   ALT 14 10/21/2019   AST 15 10/21/2019   ALKPHOS 52 10/21/2019   BILITOT 1.5 (H) 10/21/2019     Review of Systems  Constitutional: Positive for chills. Negative for diaphoresis, fatigue, fever and unexpected weight change.  HENT: Positive for congestion, hoarse voice and sinus pressure. Negative for ear discharge, ear pain, rhinorrhea, sneezing and sore throat.   Eyes: Negative for photophobia, pain, discharge, redness and itching.  Respiratory: Positive for cough. Negative for shortness of breath, wheezing and stridor.   Gastrointestinal: Negative for abdominal pain, blood in  stool, constipation, diarrhea, nausea and vomiting.  Endocrine: Negative for cold intolerance, heat intolerance, polydipsia, polyphagia and polyuria.  Genitourinary: Negative for dysuria, flank pain, frequency, hematuria, menstrual problem, pelvic pain, urgency, vaginal bleeding and vaginal discharge.  Musculoskeletal: Negative for arthralgias, back pain, myalgias and neck pain.  Skin: Negative for rash.  Allergic/Immunologic: Negative for environmental allergies and food allergies.  Neurological: Negative for dizziness, weakness, light-headedness, numbness and headaches.  Hematological: Negative for adenopathy. Does not bruise/bleed easily.  Psychiatric/Behavioral: Negative for dysphoric mood. The patient is not nervous/anxious.     Patient Active Problem List   Diagnosis Date Noted  . MDD (major depressive disorder), recurrent episode, moderate (HCC) 09/18/2017    No Known Allergies  Past Surgical History:  Procedure Laterality Date  . COLPOSCOPY    . WISDOM TOOTH EXTRACTION      Social History   Tobacco Use  . Smoking status: Never Smoker  . Smokeless tobacco: Never Used  Vaping Use  . Vaping Use: Never used  Substance Use Topics  . Alcohol use: Not Currently    Comment: rare  . Drug use: No     Medication list has been reviewed and updated.  Current Meds  Medication Sig  . brompheniramine-pseudoephedrine-DM 30-2-10 MG/5ML syrup Take 10 mLs by mouth 4 (four) times daily as needed for up to 7 days.  . fluticasone (FLONASE) 50 MCG/ACT nasal spray Place 2 sprays into both nostrils daily.  Marland Kitchen ibuprofen (ADVIL) 800 MG tablet Take 1 tablet (800  mg total) by mouth every 8 (eight) hours as needed for up to 7 days for headache.  . Prenatal Vit-Fe Fumarate-FA (PRENATAL MULTIVITAMIN) TABS tablet Take 1 tablet by mouth daily at 12 noon.    PHQ 2/9 Scores 09/20/2019 05/01/2019 12/14/2018 12/03/2018  PHQ - 2 Score 0 0 0 0  PHQ- 9 Score 0 0 2 2    GAD 7 : Generalized Anxiety Score  09/20/2019 05/01/2019 12/14/2018 12/03/2018  Nervous, Anxious, on Edge 0 0 0 0  Control/stop worrying 0 0 0 0  Worry too much - different things 0 0 0 0  Trouble relaxing 0 0 0 0  Restless 0 0 3 1  Easily annoyed or irritable 0 0 0 0  Afraid - awful might happen 0 0 0 0  Total GAD 7 Score 0 0 3 1  Anxiety Difficulty - - Not difficult at all Not difficult at all    BP Readings from Last 3 Encounters:  06/18/20 120/78  06/13/20 (!) 126/98  03/12/20 126/82    Physical Exam Vitals and nursing note reviewed.  Constitutional:      Appearance: She is well-developed.  HENT:     Head: Normocephalic.     Right Ear: External ear normal.     Left Ear: External ear normal.     Nose: Congestion present.     Right Sinus: Maxillary sinus tenderness present. No frontal sinus tenderness.     Left Sinus: Maxillary sinus tenderness present. No frontal sinus tenderness.     Mouth/Throat:     Mouth: Mucous membranes are moist.     Pharynx: Uvula midline. Posterior oropharyngeal erythema present.  Eyes:     General: Lids are everted, no foreign bodies appreciated. No scleral icterus.       Left eye: No foreign body or hordeolum.     Conjunctiva/sclera: Conjunctivae normal.     Right eye: Right conjunctiva is not injected.     Left eye: Left conjunctiva is not injected.     Pupils: Pupils are equal, round, and reactive to light.  Neck:     Thyroid: No thyromegaly.     Vascular: No JVD.     Trachea: No tracheal deviation.  Cardiovascular:     Rate and Rhythm: Normal rate and regular rhythm.     Heart sounds: Normal heart sounds. No murmur heard. No friction rub. No gallop.   Pulmonary:     Effort: Pulmonary effort is normal. No respiratory distress.     Breath sounds: Normal breath sounds. No decreased breath sounds, wheezing, rhonchi or rales.  Abdominal:     General: Bowel sounds are normal.     Palpations: Abdomen is soft. There is no mass.     Tenderness: There is no abdominal tenderness.  There is no guarding or rebound.  Musculoskeletal:        General: No tenderness. Normal range of motion.     Cervical back: Normal range of motion and neck supple.  Lymphadenopathy:     Cervical: No cervical adenopathy.  Skin:    General: Skin is warm.     Findings: No rash.  Neurological:     Mental Status: She is alert and oriented to person, place, and time.     Cranial Nerves: No cranial nerve deficit.     Deep Tendon Reflexes: Reflexes normal.  Psychiatric:        Mood and Affect: Mood is not anxious or depressed.     Wt Readings from Last  3 Encounters:  06/18/20 136 lb (61.7 kg)  06/13/20 135 lb (61.2 kg)  03/12/20 131 lb 12.8 oz (59.8 kg)    BP 120/78   Pulse 100   Temp 98.6 F (37 C) (Oral)   Ht 5\' 5"  (1.651 m)   Wt 136 lb (61.7 kg)   LMP 05/30/2020 (Approximate)   SpO2 99%   BMI 22.63 kg/m   Assessment and Plan:  1. Acute maxillary sinusitis, recurrence not specified Chronic.  Persistent.  Stable.  Will initiate azithromycin to 50 mg 2 today followed by 1 a day for 4 days. - azithromycin (ZITHROMAX) 250 MG tablet; 2 today then 1 a day for 4 days  Dispense: 6 tablet; Refill: 0

## 2020-08-20 ENCOUNTER — Encounter: Payer: Medicaid Other | Admitting: Certified Nurse Midwife

## 2020-09-18 ENCOUNTER — Encounter: Payer: BC Managed Care – PPO | Admitting: Certified Nurse Midwife

## 2020-09-25 ENCOUNTER — Encounter: Payer: Self-pay | Admitting: Certified Nurse Midwife

## 2020-10-01 ENCOUNTER — Other Ambulatory Visit: Payer: Self-pay

## 2020-10-01 ENCOUNTER — Ambulatory Visit (INDEPENDENT_AMBULATORY_CARE_PROVIDER_SITE_OTHER): Payer: BC Managed Care – PPO | Admitting: Family Medicine

## 2020-10-01 ENCOUNTER — Encounter: Payer: Self-pay | Admitting: Family Medicine

## 2020-10-01 VITALS — BP 120/80 | HR 72 | Ht 65.0 in | Wt 134.0 lb

## 2020-10-01 DIAGNOSIS — Z111 Encounter for screening for respiratory tuberculosis: Secondary | ICD-10-CM

## 2020-10-01 DIAGNOSIS — Z0289 Encounter for other administrative examinations: Secondary | ICD-10-CM | POA: Diagnosis not present

## 2020-10-01 NOTE — Progress Notes (Signed)
Date:  10/01/2020   Name:  Jamie Dudley   DOB:  12/28/1980   MRN:  [id number]   Chief Complaint: Annual Exam (Work physical)  Patient is a 40 year old female who presents for a comprehensive physical exam. The patient reports the following problems: none. Health maintenance has been reviewed up to date,     Lab Results  Component Value Date   CREATININE 0.72 10/21/2019   BUN 20 10/21/2019   NA 139 10/21/2019   K 3.4 (L) 10/21/2019   CL 105 10/21/2019   CO2 27 10/21/2019   Lab Results  Component Value Date   CHOL 225 (H) 09/18/2017   HDL 61 09/18/2017   LDLCALC 147 (H) 09/18/2017   TRIG 85 09/18/2017   CHOLHDL 3.7 09/18/2017   Lab Results  Component Value Date   TSH 2.830 01/25/2017   No results found for: HGBA1C Lab Results  Component Value Date   WBC 7.1 10/21/2019   HGB 14.5 10/21/2019   HCT 44.0 10/21/2019   MCV 91.5 10/21/2019   PLT 227 10/21/2019   Lab Results  Component Value Date   ALT 14 10/21/2019   AST 15 10/21/2019   ALKPHOS 52 10/21/2019   BILITOT 1.5 (H) 10/21/2019     Review of Systems  Constitutional:  Negative for chills and fever.  HENT:  Negative for drooling, ear discharge, ear pain and sore throat.   Respiratory:  Negative for cough, shortness of breath and wheezing.   Cardiovascular:  Negative for chest pain, palpitations and leg swelling.  Gastrointestinal:  Negative for abdominal pain, blood in stool, constipation, diarrhea and nausea.  Endocrine: Negative for polydipsia.  Genitourinary:  Negative for dysuria, frequency, hematuria and urgency.  Musculoskeletal:  Negative for back pain, myalgias and neck pain.  Skin:  Negative for rash.  Allergic/Immunologic: Negative for environmental allergies.  Neurological:  Negative for dizziness and headaches.  Hematological:  Does not bruise/bleed easily.  Psychiatric/Behavioral:  Negative for suicidal ideas. The patient is not nervous/anxious.    Patient Active Problem List   Diagnosis  Date Noted   MDD (major depressive disorder), recurrent episode, moderate (HCC) 09/18/2017    No Known Allergies  Past Surgical History:  Procedure Laterality Date   COLPOSCOPY     WISDOM TOOTH EXTRACTION      Social History   Tobacco Use   Smoking status: Never   Smokeless tobacco: Never  Vaping Use   Vaping Use: Never used  Substance Use Topics   Alcohol use: Not Currently    Comment: rare   Drug use: No     Medication list has been reviewed and updated.  Current Meds  Medication Sig   fluticasone (FLONASE) 50 MCG/ACT nasal spray Place 2 sprays into both nostrils daily.   Prenatal Vit-Fe Fumarate-FA (PRENATAL MULTIVITAMIN) TABS tablet Take 1 tablet by mouth daily at 12 noon.    PHQ 2/9 Scores 10/01/2020 09/20/2019 05/01/2019 12/14/2018  PHQ - 2 Score 0 0 0 0  PHQ- 9 Score 0 0 0 2    GAD 7 : Generalized Anxiety Score 10/01/2020 09/20/2019 05/01/2019 12/14/2018  Nervous, Anxious, on Edge 1 0 0 0  Control/stop worrying 1 0 0 0  Worry too much - different things 0 0 0 0  Trouble relaxing 0 0 0 0  Restless 0 0 0 3  Easily annoyed or irritable 1 0 0 0  Afraid - awful might happen 0 0 0 0  Total GAD 7 Score 3 0  0 3  Anxiety Difficulty Not difficult at all - - Not difficult at all    BP Readings from Last 3 Encounters:  10/01/20 120/80  06/18/20 120/78  06/13/20 (!) 126/98    Physical Exam Vitals and nursing note reviewed.  Constitutional:      General: She is not in acute distress.    Appearance: She is well-developed, well-groomed and normal weight. She is not diaphoretic.  HENT:     Head: Normocephalic and atraumatic.     Jaw: There is normal jaw occlusion.     Right Ear: Hearing, tympanic membrane, ear canal and external ear normal.     Left Ear: Hearing, tympanic membrane, ear canal and external ear normal.     Nose: Nose normal. No congestion or rhinorrhea.     Right Turbinates: Not swollen.     Left Turbinates: Not swollen.     Mouth/Throat:     Mouth:  Mucous membranes are moist.     Dentition: Normal dentition.     Tongue: No lesions.     Palate: No mass.     Pharynx: Oropharynx is clear. Uvula midline. No pharyngeal swelling, oropharyngeal exudate, posterior oropharyngeal erythema or uvula swelling.     Tonsils: No tonsillar exudate or tonsillar abscesses.  Eyes:     General: Lids are normal. Vision grossly intact. Gaze aligned appropriately.        Right eye: No discharge.        Left eye: No discharge.     Extraocular Movements: Extraocular movements intact.     Right eye: Normal extraocular motion.     Left eye: Normal extraocular motion.     Conjunctiva/sclera: Conjunctivae normal.     Pupils: Pupils are equal, round, and reactive to light.     Funduscopic exam:    Right eye: Red reflex present.        Left eye: Red reflex present. Neck:     Thyroid: No thyroid mass, thyromegaly or thyroid tenderness.     Vascular: No JVD.  Cardiovascular:     Rate and Rhythm: Normal rate and regular rhythm.     Chest Wall: PMI is not displaced.     Pulses: Normal pulses.          Carotid pulses are 2+ on the right side and 2+ on the left side.      Radial pulses are 2+ on the right side and 2+ on the left side.       Femoral pulses are 2+ on the right side and 2+ on the left side.      Popliteal pulses are 2+ on the right side and 2+ on the left side.       Dorsalis pedis pulses are 2+ on the right side and 2+ on the left side.       Posterior tibial pulses are 2+ on the right side and 2+ on the left side.     Heart sounds: Normal heart sounds, S1 normal and S2 normal. No murmur heard. No systolic murmur is present.  No diastolic murmur is present.    No friction rub. No gallop. No S3 or S4 sounds.  Pulmonary:     Effort: Pulmonary effort is normal.     Breath sounds: Normal breath sounds. No decreased breath sounds, wheezing, rhonchi or rales.  Chest:  Breasts:    Right: No supraclavicular adenopathy.     Left: No supraclavicular  adenopathy.  Abdominal:     General: Bowel  sounds are normal.     Palpations: Abdomen is soft. There is no hepatomegaly, splenomegaly or mass.     Tenderness: There is no abdominal tenderness. There is no guarding.  Musculoskeletal:        General: Normal range of motion.     Cervical back: Normal range of motion and neck supple.     Right lower leg: No edema.     Left lower leg: No edema.  Lymphadenopathy:     Head:     Right side of head: No submandibular or tonsillar adenopathy.     Left side of head: No submandibular or tonsillar adenopathy.     Cervical: No cervical adenopathy.     Right cervical: No superficial, deep or posterior cervical adenopathy.    Left cervical: No superficial, deep or posterior cervical adenopathy.     Upper Body:     Right upper body: No supraclavicular adenopathy.     Left upper body: No supraclavicular adenopathy.  Skin:    General: Skin is warm and dry.     Capillary Refill: Capillary refill takes less than 2 seconds.  Neurological:     Mental Status: She is alert.     Cranial Nerves: Cranial nerves are intact.     Sensory: Sensation is intact.     Motor: Motor function is intact.     Deep Tendon Reflexes: Reflexes are normal and symmetric.     Reflex Scores:      Tricep reflexes are 2+ on the right side and 2+ on the left side.      Bicep reflexes are 2+ on the right side and 2+ on the left side.      Brachioradialis reflexes are 2+ on the right side and 2+ on the left side.      Patellar reflexes are 2+ on the right side and 2+ on the left side.      Achilles reflexes are 2+ on the right side and 2+ on the left side. Psychiatric:        Behavior: Behavior is cooperative.    Wt Readings from Last 3 Encounters:  10/01/20 134 lb (60.8 kg)  06/18/20 136 lb (61.7 kg)  06/13/20 135 lb (61.2 kg)    BP 120/80   Pulse 72   Ht 5\' 5"  (1.651 m)   Wt 134 lb (60.8 kg)   BMI 22.30 kg/m   Assessment and Plan: Daina Such is a 40 y.o. female who  presents today for her Complete Annual Exam. She feels well. She reports exercising occasional. She reports she is sleeping well.  Patient's chart was reviewed for previous encounters most recent labs most recent imaging in Care Everywhere. 1. Encounter for physical examination related to employment Immunizations are reviewed and recommendations provided.   Age appropriate screening tests are discussed. Counseling given for risk factor reduction interventions.  Patient has negative subjective/review of systems/physical exam for any abnormalities.  2. Screening-pulmonary TB Any pulmonary TB will be done and we will recheck in 48 hours. - TB Skin Test

## 2020-10-02 ENCOUNTER — Other Ambulatory Visit: Payer: Self-pay

## 2020-10-02 DIAGNOSIS — Z111 Encounter for screening for respiratory tuberculosis: Secondary | ICD-10-CM | POA: Diagnosis not present

## 2020-10-02 DIAGNOSIS — L301 Dyshidrosis [pompholyx]: Secondary | ICD-10-CM | POA: Insufficient documentation

## 2020-10-08 LAB — QUANTIFERON-TB GOLD PLUS
QuantiFERON Mitogen Value: >10 IU/mL
QuantiFERON Nil Value: 0.03 IU/mL
QuantiFERON TB1 Ag Value: 0.04 IU/mL
QuantiFERON TB2 Ag Value: 0.05 IU/mL
QuantiFERON-TB Gold Plus: NEGATIVE

## 2020-10-08 NOTE — Progress Notes (Signed)
Called pt left VM to call back. ? ?PEC nurse may give results to patient if they return call to clinic, a CRM has been created. ? ?KP

## 2021-06-08 ENCOUNTER — Encounter: Payer: Self-pay | Admitting: Emergency Medicine

## 2021-06-08 ENCOUNTER — Ambulatory Visit
Admission: EM | Admit: 2021-06-08 | Discharge: 2021-06-08 | Disposition: A | Discharge status: Home / Self Care | Payer: No Typology Code available for payment source | Attending: Physician Assistant | Admitting: Physician Assistant

## 2021-06-08 ENCOUNTER — Other Ambulatory Visit: Payer: Self-pay

## 2021-06-08 DIAGNOSIS — R0981 Nasal congestion: Secondary | ICD-10-CM | POA: Diagnosis present

## 2021-06-08 DIAGNOSIS — J069 Acute upper respiratory infection, unspecified: Secondary | ICD-10-CM

## 2021-06-08 DIAGNOSIS — R051 Acute cough: Secondary | ICD-10-CM | POA: Insufficient documentation

## 2021-06-08 LAB — GROUP A STREP BY PCR: Group A Strep by PCR: NOT DETECTED

## 2021-06-08 NOTE — Discharge Instructions (Signed)
URI/COLD SYMPTOMS: Negative strep test. Your exam today is consistent with a viral illness. Antibiotics are not indicated at this time. Use medications as directed, including cough syrup, nasal saline, and decongestants. Your symptoms should improve over the next few days and resolve within 7-10 days. Increase rest and fluids. F/u if symptoms worsen or predominate such as sore throat, ear pain, productive cough, shortness of breath, or if you develop high fevers or worsening fatigue over the next several days.   ?

## 2021-06-08 NOTE — ED Provider Notes (Signed)
?[facility] ? ? ? ?CSN: [id number] ?Arrival date & time: 06/08/21  1431 ? ? ?  ? ?History   ?Chief Complaint ?Chief Complaint  ?Patient presents with  ? Sore Throat  ? Cough  ? Nasal Congestion  ? ? ?HPI ?Jamie Dudley is a 41 y.o. female presenting for approximately 4-day history of sore throat, cough and congestion.  Also reports feeling a little fatigued.  Denies fever or breathing difficulty.  No vomiting or diarrhea reported.  Patient reports personal history of COVID-19 2 weeks ago.  States she had fully recovered.  States she was only sick for about 4 days or so.  She never took an antiviral medicine.  Today she is concerned about the possibility of strep since it is going around her workplace.  She has been taking over-the-counter Mucinex and using Synex and says those medications have helped her symptoms.  No other complaints. ? ?HPI ? ?Past Medical History:  ?Diagnosis Date  ? Atypical squamous cells of undetermined significance (ASC-US) on cervical Pap smear   ? Depression   ? Sinusitis   ? Skin-picking disorder   ? Tendonitis   ? ? ?Patient Active Problem List  ? Diagnosis Date Noted  ? Dyshidrotic eczema 10/02/2020  ? MDD (major depressive disorder), recurrent episode, moderate (HCC) 09/18/2017  ? Atypical squamous cell changes of undetermined significance (ASCUS) on cervical cytology with positive high risk human papilloma virus (HPV) 07/31/2013  ? Picking own skin 07/17/2011  ? ? ?Past Surgical History:  ?Procedure Laterality Date  ? COLPOSCOPY    ? WISDOM TOOTH EXTRACTION    ? ? ?OB History   ? ? Gravida  ?1  ? Para  ?1  ? Term  ?1  ? Preterm  ?0  ? AB  ?0  ? Living  ?1  ?  ? ? SAB  ?0  ? IAB  ?0  ? Ectopic  ?0  ? Multiple  ?0  ? Live Births  ?1  ?   ?  ?  ? ? ? ?Home Medications   ? ?Prior to Admission medications   ?Medication Sig Start Date End Date Taking? Authorizing Provider  ?fluticasone (FLONASE) 50 MCG/ACT nasal spray Place 2 sprays into both nostrils daily. 11/12/19   Domenick Gong, MD  ?Prenatal Vit-Fe Fumarate-FA (PRENATAL MULTIVITAMIN) TABS tablet Take 1 tablet by mouth daily at 12 noon.    [provider]  ? ? ?Family History ?Family History  ?Problem Relation Age of Onset  ? Heart disease Paternal Grandfather   ? Hypertension Mother   ? Breast cancer Neg Hx   ? Ovarian cancer Neg Hx   ? Colon cancer Neg Hx   ? ? ?Social History ?Social History  ? ?Tobacco Use  ? Smoking status: Never  ? Smokeless tobacco: Never  ?Vaping Use  ? Vaping Use: Never used  ?Substance Use Topics  ? Alcohol use: Not Currently  ?  Comment: rare  ? Drug use: No  ? ? ? ?Allergies   ?Patient has no known allergies. ? ? ?Review of Systems ?Review of Systems  ?Constitutional:  Positive for fatigue. Negative for chills, diaphoresis and fever.  ?HENT:  Positive for congestion, rhinorrhea and sore throat. Negative for ear pain, sinus pressure and sinus pain.   ?Respiratory:  Positive for cough. Negative for shortness of breath.   ?Cardiovascular:  Negative for chest pain.  ?Gastrointestinal:  Negative for abdominal pain, nausea and vomiting.  ?Musculoskeletal:  Negative for arthralgias and  myalgias.  ?Skin:  Negative for rash.  ?Neurological:  Negative for weakness and headaches.  ?Hematological:  Negative for adenopathy.  ? ? ?Physical Exam ?Triage Vital Signs ?ED Triage Vitals  ?Enc Vitals Group  ?   BP 06/08/21 1444 (!) 141/102  ?   Pulse Rate 06/08/21 1444 77  ?   Resp 06/08/21 1444 16  ?   Temp 06/08/21 1444 98.4 ?F (36.9 ?C)  ?   Temp Source 06/08/21 1444 Oral  ?   SpO2 06/08/21 1444 100 %  ?   Weight 06/08/21 1443 134 lb 0.6 oz (60.8 kg)  ?   Height 06/08/21 1443 5\' 5"  (1.651 m)  ?   Head Circumference --   ?   Peak Flow --   ?   Pain Score 06/08/21 1443 2  ?   Pain Loc --   ?   Pain Edu? --   ?   Excl. in [place]? --   ? ?No data found. ? ?Updated Vital Signs ?BP (!) 141/102 (BP Location: Left Arm)   Pulse 77   Temp 98.4 ?F (36.9 ?C) (Oral)   Resp 16   Ht 5\' 5"  (1.651 m)   Wt 134 lb 0.6 oz (60.8  kg)   SpO2 100%   BMI 22.31 kg/m?  ?   ? ?Physical Exam ?Vitals and nursing note reviewed.  ?Constitutional:   ?   General: She is not in acute distress. ?   Appearance: Normal appearance. She is not ill-appearing or toxic-appearing.  ?HENT:  ?   Head: Normocephalic and atraumatic.  ?   Nose: Congestion present.  ?   Mouth/Throat:  ?   Mouth: Mucous membranes are moist.  ?   Pharynx: Oropharynx is clear. Posterior oropharyngeal erythema present.  ?Eyes:  ?   General: No scleral icterus.    ?   Right eye: No discharge.     ?   Left eye: No discharge.  ?   Conjunctiva/sclera: Conjunctivae normal.  ?Cardiovascular:  ?   Rate and Rhythm: Normal rate and regular rhythm.  ?   Heart sounds: Normal heart sounds.  ?Pulmonary:  ?   Effort: Pulmonary effort is normal. No respiratory distress.  ?   Breath sounds: Normal breath sounds.  ?Musculoskeletal:  ?   Cervical back: Neck supple.  ?Skin: ?   General: Skin is dry.  ?Neurological:  ?   General: No focal deficit present.  ?   Mental Status: She is alert. Mental status is at baseline.  ?   Motor: No weakness.  ?   Gait: Gait normal.  ?Psychiatric:     ?   Mood and Affect: Mood normal.     ?   Behavior: Behavior normal.     ?   Thought Content: Thought content normal.  ? ? ? ?UC Treatments / Results  ?Labs ?(all labs ordered are listed, but only abnormal results are displayed) ?Labs Reviewed  ?GROUP A STREP BY PCR  ? ? ?EKG ? ? ?Radiology ?No results found. ? ?Procedures ?Procedures (including critical care time) ? ?Medications Ordered in UC ?Medications - No data to display ? ?Initial Impression / Assessment and Plan / UC Course  ?I have reviewed the triage vital signs and the nursing notes. ? ?Pertinent labs & imaging results that were available during my care of the patient were reviewed by me and considered in my medical decision making (see chart for details). ? ?41 year old female presenting for 4-day history of nasal  congestion, cough and sore throat as well as  fatigue.  No associated fever.  Potential exposure to strep.  Breast history of COVID-19 2 weeks ago.  Vitals are stable.  She is afebrile.  She is overall well-appearing.  On exam she does have nasal congestion and mild posterior frontal erythema.  Chest clear auscultation heart regular rate and rhythm.  PCR strep test obtained today.  Indication to test for COVID-19 given that she recently recovered from it.  PCR strep was negative.  Reviewed result with patient.  Advised her symptoms consistent with a viral URI.  Supportive care encouraged with continuing medications as she has been taking.  Reviewed return and ER precautions and handout.  Work note given. ? ? ?Final Clinical Impressions(s) / UC Diagnoses  ? ?Final diagnoses:  ?Viral upper respiratory tract infection  ?Acute cough  ?Nasal congestion  ? ? ? ?Discharge Instructions   ? ?  ?URI/COLD SYMPTOMS: Negative strep test. Your exam today is consistent with a viral illness. Antibiotics are not indicated at this time. Use medications as directed, including cough syrup, nasal saline, and decongestants. Your symptoms should improve over the next few days and resolve within 7-10 days. Increase rest and fluids. F/u if symptoms worsen or predominate such as sore throat, ear pain, productive cough, shortness of breath, or if you develop high fevers or worsening fatigue over the next several days.   ? ? ? ? ?ED Prescriptions   ?None ?  ? ?PDMP not reviewed this encounter. ?  ?Shirlee Latchaves, Jeanmarc Viernes B, PA-C ?06/08/21 1540 ? ?

## 2021-06-08 NOTE — ED Triage Notes (Signed)
Pt reports sore throat, cough and nasal congestion since Friday. States Strep has been affecting a lot of people at her job.  ?

## 2021-07-11 ENCOUNTER — Ambulatory Visit
Admission: EM | Admit: 2021-07-11 | Discharge: 2021-07-11 | Disposition: A | Discharge status: Home / Self Care | Payer: No Typology Code available for payment source | Attending: Physician Assistant | Admitting: Physician Assistant

## 2021-07-11 ENCOUNTER — Other Ambulatory Visit: Payer: Self-pay

## 2021-07-11 DIAGNOSIS — H6122 Impacted cerumen, left ear: Secondary | ICD-10-CM

## 2021-07-11 NOTE — ED Provider Notes (Signed)
?[facility] ? ? ? ?CSN: [id number] ?Arrival date & time: 07/11/21  [id number] ? ? ?  ? ?History   ?Chief Complaint ?Chief Complaint  ?Patient presents with  ? Ear Fullness  ?  Left   ? ? ?HPI ?Jamie Dudley is a 41 y.o. female presenting for 3 to 4-day history of left ear fullness, pressure and reduced hearing.  Also reports mild nasal congestion likely related to her allergies.  She uses Flonase occasionally.  Has tried to clean the area out with over-the-counter eardrops and flushing with water but has been unsuccessful.  She has not had any related fevers, drainage from ear, sinus pain.  No other complaints today. ? ?HPI ? ?Past Medical History:  ?Diagnosis Date  ? Atypical squamous cells of undetermined significance (ASC-US) on cervical Pap smear   ? Depression   ? Sinusitis   ? Skin-picking disorder   ? Tendonitis   ? ? ?Patient Active Problem List  ? Diagnosis Date Noted  ? Dyshidrotic eczema 10/02/2020  ? MDD (major depressive disorder), recurrent episode, moderate ([place]) 09/18/2017  ? Atypical squamous cell changes of undetermined significance (ASCUS) on cervical cytology with positive high risk human papilloma virus (HPV) 07/31/2013  ? Picking own skin 07/17/2011  ? ? ?Past Surgical History:  ?Procedure Laterality Date  ? COLPOSCOPY    ? WISDOM TOOTH EXTRACTION    ? ? ?OB History   ? ? Gravida  ?1  ? Para  ?1  ? Term  ?1  ? Preterm  ?0  ? AB  ?0  ? Living  ?1  ?  ? ? SAB  ?0  ? IAB  ?0  ? Ectopic  ?0  ? Multiple  ?0  ? Live Births  ?1  ?   ?  ?  ? ? ? ?Home Medications   ? ?Prior to Admission medications   ?Medication Sig Start Date End Date Taking? Authorizing Provider  ?fluticasone (FLONASE) 50 MCG/ACT nasal spray Place 2 sprays into both nostrils daily. 11/12/19  Yes Melynda Ripple, MD  ?Prenatal Vit-Fe Fumarate-FA (PRENATAL MULTIVITAMIN) TABS tablet Take 1 tablet by mouth daily at 12 noon.    [provider]  ? ? ?Family History ?Family History  ?Problem Relation Age of Onset  ? Heart disease  Paternal Grandfather   ? Hypertension Mother   ? Breast cancer Neg Hx   ? Ovarian cancer Neg Hx   ? Colon cancer Neg Hx   ? ? ?Social History ?Social History  ? ?Tobacco Use  ? Smoking status: Never  ? Smokeless tobacco: Never  ?Vaping Use  ? Vaping Use: Never used  ?Substance Use Topics  ? Alcohol use: Not Currently  ?  Comment: rare  ? Drug use: No  ? ? ? ?Allergies   ?Patient has no known allergies. ? ? ?Review of Systems ?Review of Systems  ?Constitutional:  Negative for chills, diaphoresis, fatigue and fever.  ?HENT:  Positive for congestion, ear pain and hearing loss. Negative for ear discharge, rhinorrhea, sinus pressure, sinus pain and sore throat.   ?Respiratory:  Negative for cough.   ?Gastrointestinal:  Negative for nausea and vomiting.  ?Musculoskeletal:  Negative for arthralgias and myalgias.  ?Skin:  Negative for rash.  ?Neurological:  Negative for weakness and headaches.  ?Hematological:  Negative for adenopathy.  ? ? ?Physical Exam ?Triage Vital Signs ?ED Triage Vitals  ?Enc Vitals Group  ?   BP 07/11/21 1014 (!) 141/98  ?   Pulse Rate  07/11/21 1014 73  ?   Resp 07/11/21 1014 14  ?   Temp 07/11/21 1014 99.4 ?F (37.4 ?C)  ?   Temp Source 07/11/21 1014 Oral  ?   SpO2 07/11/21 1014 100 %  ?   Weight 07/11/21 1015 135 lb (61.2 kg)  ?   Height 07/11/21 1015 5\' 5"  (1.651 m)  ?   Head Circumference --   ?   Peak Flow --   ?   Pain Score 07/11/21 1015 1  ?   Pain Loc --   ?   Pain Edu? --   ?   Excl. in [place]? --   ? ?No data found. ? ?Updated Vital Signs ?BP (!) 141/98 (BP Location: Left Arm)   Pulse 73   Temp 99.4 ?F (37.4 ?C) (Oral)   Resp 14   Ht 5\' 5"  (1.651 m)   Wt 135 lb (61.2 kg)   LMP 07/04/2021 (Approximate)   SpO2 100%   BMI 22.47 kg/m?  ? ?   ? ?Physical Exam ?Vitals and nursing note reviewed.  ?Constitutional:   ?   General: She is not in acute distress. ?   Appearance: Normal appearance. She is not ill-appearing or toxic-appearing.  ?HENT:  ?   Head: Normocephalic and atraumatic.  ?    Right Ear: Tympanic membrane, ear canal and external ear normal.  ?   Left Ear: There is impacted cerumen.  ?   Nose: Congestion present.  ?   Mouth/Throat:  ?   Mouth: Mucous membranes are moist.  ?   Pharynx: Oropharynx is clear.  ?Eyes:  ?   General: No scleral icterus.    ?   Right eye: No discharge.     ?   Left eye: No discharge.  ?   Conjunctiva/sclera: Conjunctivae normal.  ?Cardiovascular:  ?   Rate and Rhythm: Normal rate and regular rhythm.  ?   Heart sounds: Normal heart sounds.  ?Pulmonary:  ?   Effort: Pulmonary effort is normal. No respiratory distress.  ?   Breath sounds: Normal breath sounds.  ?Musculoskeletal:  ?   Cervical back: Neck supple.  ?Skin: ?   General: Skin is dry.  ?Neurological:  ?   General: No focal deficit present.  ?   Mental Status: She is alert. Mental status is at baseline.  ?   Motor: No weakness.  ?   Gait: Gait normal.  ?Psychiatric:     ?   Mood and Affect: Mood normal.     ?   Behavior: Behavior normal.     ?   Thought Content: Thought content normal.  ? ? ? ?UC Treatments / Results  ?Labs ?(all labs ordered are listed, but only abnormal results are displayed) ?Labs Reviewed - No data to display ? ?EKG ? ? ?Radiology ?No results found. ? ?Procedures ?Procedures (including critical care time) ? ?Medications Ordered in UC ?Medications - No data to display ? ?Initial Impression / Assessment and Plan / UC Course  ?I have reviewed the triage vital signs and the nursing notes. ? ?Pertinent labs & imaging results that were available during my care of the patient were reviewed by me and considered in my medical decision making (see chart for details). ? ?41 year old female presenting for 3 to 4-day history of left sided ear pressure, fullness and reduced hearing.  Has tried to clean the ear out with OTC products and water without relief.  Mild nasal congestion related to allergies.  Using Flonase.  On exam she does have cerumen impaction of the left EAC.  Offered to perform otic  lavage to remove the wax and reassess the ear.  Patient gives consent.  Otic lavage performed by nursing staff.  Patient tolerated well.  All wax removed from [facility].  Visualization of TM shows normal-appearing TM.  Patient reports relief of symptoms.  Advised to continue her Flonase and allergy medicine.  Follow-up as needed especially if she develops ear pain. ? ? ?Final Clinical Impressions(s) / UC Diagnoses  ? ?Final diagnoses:  ?Impacted cerumen of left ear  ? ?Discharge Instructions   ?None ?  ? ?ED Prescriptions   ?None ?  ? ?PDMP not reviewed this encounter. ?  ?Danton Clap, PA-C ?07/11/21 1141 ? ?

## 2021-07-11 NOTE — ED Triage Notes (Signed)
Pt reports that her Left ear is "completely" blocked and she has a bit of a stuffy nose. This started around last Wed, or Thurs. Has used some OTC ear drops and also tried to flush her ear with water. ?

## 2021-07-29 ENCOUNTER — Ambulatory Visit
Admission: EM | Admit: 2021-07-29 | Discharge: 2021-07-29 | Disposition: A | Discharge status: Home / Self Care | Payer: No Typology Code available for payment source | Attending: Emergency Medicine | Admitting: Emergency Medicine

## 2021-07-29 DIAGNOSIS — R0602 Shortness of breath: Secondary | ICD-10-CM | POA: Insufficient documentation

## 2021-07-29 DIAGNOSIS — R0981 Nasal congestion: Secondary | ICD-10-CM | POA: Diagnosis present

## 2021-07-29 DIAGNOSIS — R509 Fever, unspecified: Secondary | ICD-10-CM | POA: Insufficient documentation

## 2021-07-29 DIAGNOSIS — B349 Viral infection, unspecified: Secondary | ICD-10-CM | POA: Insufficient documentation

## 2021-07-29 DIAGNOSIS — J029 Acute pharyngitis, unspecified: Secondary | ICD-10-CM

## 2021-07-29 DIAGNOSIS — R112 Nausea with vomiting, unspecified: Secondary | ICD-10-CM | POA: Diagnosis not present

## 2021-07-29 DIAGNOSIS — Z20822 Contact with and (suspected) exposure to covid-19: Secondary | ICD-10-CM | POA: Insufficient documentation

## 2021-07-29 LAB — GROUP A STREP BY PCR: Group A Strep by PCR: NOT DETECTED

## 2021-07-29 LAB — RESP PANEL BY RT-PCR (FLU A&B, COVID) ARPGX2
Influenza A by PCR: NEGATIVE
Influenza B by PCR: NEGATIVE
SARS Coronavirus 2 by RT PCR: NEGATIVE

## 2021-07-29 MED ORDER — ONDANSETRON 4 MG PO TBDP
4.0000 mg | ORAL_TABLET | Freq: Once | ORAL | Status: AC
  Administered 2021-07-29: 4 mg via ORAL

## 2021-07-29 MED ORDER — PROMETHAZINE HCL 25 MG PO TABS: 25.0000 mg | ORAL_TABLET | Freq: Four times a day (QID) | ORAL | 0 refills | Status: DC | PRN

## 2021-07-29 MED ORDER — ONDANSETRON HCL 4 MG PO TABS: 4.0000 mg | ORAL_TABLET | Freq: Four times a day (QID) | ORAL | 0 refills | Status: DC

## 2021-07-29 MED ORDER — PROMETHAZINE HCL 25 MG/ML IJ SOLN
12.5000 mg | Freq: Once | INTRAMUSCULAR | Status: AC
  Administered 2021-07-29: 12.5 mg via INTRAMUSCULAR

## 2021-07-29 NOTE — Discharge Instructions (Addendum)
This may be more viral in nature. Take nausea medicine as needed Avoid heavy spicy foods the next 24 hours If any of your test returned back positive we will give you a call check your MyChart for results

## 2021-07-29 NOTE — ED Provider Notes (Signed)
[facility]    CSN: [id number] Arrival date & time: 07/29/21  1001      History   Chief Complaint Chief Complaint  Patient presents with   Nasal Congestion   Nausea    HPI Jamie Dudley is a 41 y.o. female.   Patient presents today with active emesis, nasal congestion, fever of 102 at home, body aches, and some shortness of breath.  Patient has taken over-the-counter medicines with no relief.  Denies any chest pain at this time does have intermittent abdominal pain with emesis.  Denies any urinary symptoms symptoms cough.  Patient has been ill like this for the past 24 hours.  Patient states that she did have a coworker with similar symptoms.   Past Medical History:  Diagnosis Date   Atypical squamous cells of undetermined significance (ASC-US) on cervical Pap smear    Depression    Sinusitis    Skin-picking disorder    Tendonitis     Patient Active Problem List   Diagnosis Date Noted   Dyshidrotic eczema 10/02/2020   MDD (major depressive disorder), recurrent episode, moderate (HCC) 09/18/2017   Atypical squamous cell changes of undetermined significance (ASCUS) on cervical cytology with positive high risk human papilloma virus (HPV) 07/31/2013   Picking own skin 07/17/2011    Past Surgical History:  Procedure Laterality Date   COLPOSCOPY     WISDOM TOOTH EXTRACTION      OB History     Gravida  1   Para  1   Term  1   Preterm  0   AB  0   Living  1      SAB  0   IAB  0   Ectopic  0   Multiple  0   Live Births  1            Home Medications    Prior to Admission medications   Medication Sig Start Date End Date Taking? Authorizing Provider  fluticasone (FLONASE) 50 MCG/ACT nasal spray Place 2 sprays into both nostrils daily. 11/12/19  Yes Domenick Gong, MD  ondansetron (ZOFRAN) 4 MG tablet Take 1 tablet (4 mg total) by mouth every 6 (six) hours. 07/29/21  Yes Coralyn Mark, NP  Prenatal Vit-Fe Fumarate-FA (PRENATAL  MULTIVITAMIN) TABS tablet Take 1 tablet by mouth daily at 12 noon.   Yes [provider]  promethazine (PHENERGAN) 25 MG tablet Take 1 tablet (25 mg total) by mouth every 6 (six) hours as needed for nausea or vomiting. 07/29/21  Yes Coralyn Mark, NP    Family History Family History  Problem Relation Age of Onset   Heart disease Paternal Grandfather    Hypertension Mother    Breast cancer Neg Hx    Ovarian cancer Neg Hx    Colon cancer Neg Hx     Social History Social History   Tobacco Use   Smoking status: Never   Smokeless tobacco: Never  Vaping Use   Vaping Use: Never used  Substance Use Topics   Alcohol use: Not Currently    Comment: rare   Drug use: No     Allergies   Patient has no known allergies.   Review of Systems Review of Systems  Constitutional:  Positive for activity change, chills, fatigue and fever.  HENT:  Positive for congestion, postnasal drip, sinus pressure, sinus pain and sore throat.   Respiratory:  Positive for cough and shortness of breath.   Cardiovascular: Negative.   Gastrointestinal:  Positive for abdominal pain.  Genitourinary: Negative.   Neurological: Negative.     Physical Exam Triage Vital Signs ED Triage Vitals  Enc Vitals Group     BP 07/29/21 1051 96/80     Pulse Rate 07/29/21 1051 (!) 119     Resp 07/29/21 1051 18     Temp 07/29/21 1051 98.6 F (37 C)     Temp Source 07/29/21 1051 Oral     SpO2 07/29/21 1051 100 %     Weight 07/29/21 1049 135 lb (61.2 kg)     Height 07/29/21 1049 5\' 5"  (1.651 m)     Head Circumference --      Peak Flow --      Pain Score 07/29/21 1049 4     Pain Loc --      Pain Edu? --      Excl. in GC? --    No data found.  Updated Vital Signs BP 96/80 (BP Location: Left Arm)   Pulse (!) 119   Temp 98.6 F (37 C) (Oral)   Resp 18   Ht 5\' 5"  (1.651 m)   Wt 135 lb (61.2 kg)   LMP 07/28/2021 (Approximate)   SpO2 100%   BMI 22.47 kg/m   Visual Acuity Right Eye Distance:    Left Eye Distance:   Bilateral Distance:    Right Eye Near:   Left Eye Near:    Bilateral Near:     Physical Exam Constitutional:      Appearance: She is ill-appearing.  HENT:     Right Ear: Tympanic membrane normal.     Left Ear: Tympanic membrane normal.     Nose: Congestion present.     Mouth/Throat:     Mouth: Mucous membranes are moist.     Pharynx: Posterior oropharyngeal erythema present.  Eyes:     Pupils: Pupils are equal, round, and reactive to light.  Cardiovascular:     Rate and Rhythm: Tachycardia present.  Pulmonary:     Effort: Pulmonary effort is normal.     Breath sounds: Normal breath sounds.  Abdominal:     General: Abdomen is flat.     Tenderness: There is no right CVA tenderness or left CVA tenderness.     Comments: Patient has active emesis while in the room  Musculoskeletal:        General: Normal range of motion.  Skin:    General: Skin is warm.     Capillary Refill: Capillary refill takes less than 2 seconds.  Neurological:     General: No focal deficit present.     Mental Status: She is alert.  Your symptoms   UC Treatments / Results  Labs (all labs ordered are listed, but only abnormal results are displayed) Labs Reviewed  GROUP A STREP BY PCR  RESP PANEL BY RT-PCR (FLU A&B, COVID) [id number]    EKG   Radiology No results found.  Procedures Procedures (including critical care time)  Medications Ordered in UC Medications  ondansetron (ZOFRAN-ODT) disintegrating tablet 4 mg (4 mg Oral Given 07/29/21 1101)  promethazine (PHENERGAN) injection 12.5 mg (12.5 mg Intramuscular Given 07/29/21 1257)    Initial Impression / Assessment and Plan / UC Course  I have reviewed the triage vital signs and the nursing notes.  Pertinent labs & imaging results that were available during my care of the patient were reviewed by me and considered in my medical decision making (see chart for details).    This may be  more viral in nature. Take nausea  medicine as needed Avoid heavy spicy foods the next 24 hours If any of your test returned back positive we will give you a call check your MyChart for results Patient is tolerating p.o. fluids after the Phenergan Strep test is negative  Final Clinical Impressions(s) / UC Diagnoses   Final diagnoses:  Nausea and vomiting, unspecified vomiting type  Viral illness     Discharge Instructions      This may be more viral in nature. Take nausea medicine as needed Avoid heavy spicy foods the next 24 hours If any of your test returned back positive we will give you a call check your MyChart for results     ED Prescriptions     Medication Sig Dispense Auth. Provider   ondansetron (ZOFRAN) 4 MG tablet Take 1 tablet (4 mg total) by mouth every 6 (six) hours. 12 tablet Coralyn Mark, NP   promethazine (PHENERGAN) 25 MG tablet Take 1 tablet (25 mg total) by mouth every 6 (six) hours as needed for nausea or vomiting. 30 tablet Coralyn Mark, NP      PDMP not reviewed this encounter.   Coralyn Mark, NP 07/29/21 1319

## 2021-07-29 NOTE — ED Triage Notes (Addendum)
Pt c/o nasal congestion, body chills, nausea x2days.   Pt was around a coworker who had an URI  Pt believes she has the same and is not worried about Covid   Pt states that she has vomited last night and is dry heaving this morning.

## 2021-08-10 ENCOUNTER — Ambulatory Visit: Payer: Self-pay
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
## 2021-08-11 ENCOUNTER — Ambulatory Visit: Payer: BC Managed Care – PPO | Admitting: Family Medicine

## 2021-08-17 ENCOUNTER — Ambulatory Visit (INDEPENDENT_AMBULATORY_CARE_PROVIDER_SITE_OTHER): Payer: No Typology Code available for payment source | Admitting: Family Medicine

## 2021-08-17 ENCOUNTER — Encounter: Payer: Self-pay | Admitting: Family Medicine

## 2021-08-17 VITALS — BP 120/64 | HR 86 | Ht 65.0 in | Wt 140.0 lb

## 2021-08-17 DIAGNOSIS — M898X6 Other specified disorders of bone, lower leg: Secondary | ICD-10-CM | POA: Diagnosis not present

## 2021-08-17 MED ORDER — MELOXICAM 7.5 MG PO TABS: 7.5000 mg | ORAL_TABLET | Freq: Every day | ORAL | 0 refills | Status: DC
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
## 2021-08-17 NOTE — Patient Instructions (Signed)
Contusion A contusion is a deep bruise. Contusions are the result of a blunt injury to tissues and muscle fibers under the skin. The injury causes bleeding under the skin. The skin overlying the contusion may turn blue, purple, or yellow. Minor injuries will give you a painless contusion, but more severe injuries cause contusions that may stay painful and swollen for a few weeks. Follow these instructions at home: Pay attention to any changes in your symptoms. Let your health care provider know about them. Take these actions to relieve your pain. Managing pain, stiffness, and swelling  Use resting, icing, applying pressure (compression), and raising (elevating) the injured area. This is often called the RICE strategy. Rest the injured area. Return to your normal activities as told by your health care provider. Ask your health care provider what activities are safe for you. If directed, put ice on the injured area: Put ice in a plastic bag. Place a towel between your skin and the bag. Leave the ice on for 20 minutes, 2-3 times per day. If directed, apply light compression to the injured area using an elastic bandage. Make sure the bandage is not wrapped too tightly. Remove and reapply the bandage as directed by your health care provider. If possible, raise (elevate) the injured area above the level of your heart while you are sitting or lying down. General instructions Take over-the-counter and prescription medicines only as told by your health care provider. Keep all follow-up visits as told by your health care provider. This is important. Contact a health care provider if: Your symptoms do not improve after several days of treatment. Your symptoms get worse. You have difficulty moving the injured area. Get help right away if: You have severe pain. You have numbness in a hand or foot. Your hand or foot turns pale or cold. Summary A contusion is a deep bruise. Contusions are the result of a  blunt injury to tissues and muscle fibers under the skin. It is treated with rest, ice, compression, and elevation. You may be given over-the-counter medicines for pain. Contact a health care provider if your symptoms do not improve, or get worse. Get help right away if you have severe pain, have numbness, or the area turns pale or cold. This information is not intended to replace advice given to you by your health care provider. Make sure you discuss any questions you have with your health care provider. Document Revised: 01/12/2021 Document Reviewed: 12/24/2020 Elsevier Patient Education  2023 [employer]vier Inc.  

## 2021-08-19 ENCOUNTER — Telehealth: Payer: Self-pay | Admitting: Family Medicine

## 2021-08-19 NOTE — Telephone Encounter (Signed)
Called pt left VM to call back. None of the prescribes here prescribe any medications for ADHD. A referral would need to be placed for pt to see psychiatry. Pt can not switch providers in our office. Pt she wants a new PCP she would need to find another office. If patient would like a referral to psych we can placed a referral to [facility] on [address].  KP

## 2021-08-19 NOTE — Telephone Encounter (Signed)
Copied from CRM [id number]. Topic: General - Inquiry >> Aug 19, 2021  3:06 PM De Blanch wrote: Reason for CRM: Pt stated she would like information about getting tested for ADHD and autism. Pt is requesting a call back to discuss. Pt stated she has not spoken with the PCP about this before.   Pt mentioned if it was possible to get a new PCP.   Please advise.

## 2021-08-20 ENCOUNTER — Telehealth: Payer: Self-pay

## 2021-08-20 DIAGNOSIS — F909 Attention-deficit hyperactivity disorder, unspecified type: Secondary | ICD-10-CM

## 2021-08-20 NOTE — Telephone Encounter (Signed)
Spoke to pt let her know that I would place a referral for [facility] for psychiatry for ADHD. Pt verbalized understanding.  KP

## 2021-08-23 ENCOUNTER — Encounter: Payer: BC Managed Care – PPO | Admitting: Certified Nurse Midwife

## 2021-08-24 ENCOUNTER — Other Ambulatory Visit: Payer: Self-pay | Admitting: Physician Assistant

## 2021-08-24 ENCOUNTER — Ambulatory Visit
Admission: RE | Admit: 2021-08-24 | Discharge: 2021-08-24 | Disposition: A | Discharge status: Home / Self Care | Payer: No Typology Code available for payment source | Attending: Physician Assistant | Admitting: Physician Assistant

## 2021-08-24 ENCOUNTER — Ambulatory Visit
Admission: RE | Admit: 2021-08-24 | Discharge: 2021-08-24 | Disposition: A | Discharge status: Home / Self Care | Payer: No Typology Code available for payment source | Source: Ambulatory Visit | Attending: Physician Assistant | Admitting: Physician Assistant

## 2021-08-24 DIAGNOSIS — M79604 Pain in right leg: Secondary | ICD-10-CM | POA: Diagnosis present

## 2021-09-09 ENCOUNTER — Ambulatory Visit (INDEPENDENT_AMBULATORY_CARE_PROVIDER_SITE_OTHER): Payer: Medicaid Other | Admitting: Certified Nurse Midwife

## 2021-09-09 ENCOUNTER — Encounter: Payer: Self-pay | Admitting: Certified Nurse Midwife

## 2021-09-09 VITALS — BP 143/99 | HR 81 | Ht 65.0 in | Wt 138.7 lb

## 2021-09-09 DIAGNOSIS — M6208 Separation of muscle (nontraumatic), other site: Secondary | ICD-10-CM | POA: Diagnosis not present

## 2021-09-09 NOTE — Progress Notes (Signed)
GYN ENCOUNTER NOTE  Subjective:       Jamie Dudley is a 41 y.o. G22P1001 female is here for gynecologic evaluation of the following issues:  1. Abdominal pouch since having her baby. She denies any back pain , or pelvic pain as a results. States she has not been able to get it to go away with diet & exercise.     Obstetric History OB History  Gravida Para Term Preterm AB Living  1 1 1  0 0 1  SAB IAB Ectopic Multiple Live Births  0 0 0 0 1    # Outcome Date GA Lbr Len/2nd Weight Sex Delivery Anes PTL Lv  1 Term 11/21/18 [redacted]w[redacted]d / 01:09 7 lb 7.9 oz (3.4 kg) M Vag-Spont None  LIV    Past Medical History:  Diagnosis Date   Atypical squamous cells of undetermined significance (ASC-US) on cervical Pap smear    Depression    Sinusitis    Skin-picking disorder    Tendonitis     Past Surgical History:  Procedure Laterality Date   COLPOSCOPY     WISDOM TOOTH EXTRACTION      Current Outpatient Medications on File Prior to Visit  Medication Sig Dispense Refill   fluticasone (FLONASE) 50 MCG/ACT nasal spray Place 2 sprays into both nostrils daily. 16 g 0   ondansetron (ZOFRAN) 4 MG tablet Take 1 tablet (4 mg total) by mouth every 6 (six) hours. (Patient not taking: Reported on 09/09/2021) 12 tablet 0   No current facility-administered medications on file prior to visit.    No Known Allergies  Social History   Socioeconomic History   Marital status: Single    Spouse name: Not on file   Number of children: Not on file   Years of education: Not on file   Highest education level: Not on file  Occupational History   Not on file  Tobacco Use   Smoking status: Never   Smokeless tobacco: Never  Vaping Use   Vaping Use: Never used  Substance and Sexual Activity   Alcohol use: Not Currently    Comment: rare   Drug use: No   Sexual activity: Not Currently    Birth control/protection: None  Other Topics Concern   Not on file  Social History Narrative   Not on file   Social  Determinants of Health   Financial Resource Strain: Not on file  Food Insecurity: Not on file  Transportation Needs: Not on file  Physical Activity: Not on file  Stress: Not on file  Social Connections: Not on file  Intimate Partner Violence: Not on file    Family History  Problem Relation Age of Onset   Heart disease Paternal Grandfather    Hypertension Mother    Breast cancer Neg Hx    Ovarian cancer Neg Hx    Colon cancer Neg Hx     The following portions of the patient's history were reviewed and updated as appropriate: allergies, current medications, past family history, past medical history, past social history, past surgical history and problem list.  Review of Systems Review of Systems - Negative except we mentioned in HPI Review of Systems - General ROS: negative for - chills, fatigue, fever, hot flashes, malaise or night sweats Hematological and Lymphatic ROS: negative for - bleeding problems or swollen lymph nodes Gastrointestinal ROS: negative for - abdominal pain, blood in stools, change in bowel habits and nausea/vomiting Musculoskeletal ROS: negative for - joint pain, muscle pain or muscular weakness.  Abdominal pouch that will not go away with diet /exercise.  Genito-Urinary ROS: negative for - change in menstrual cycle, dysmenorrhea, dyspareunia, dysuria, genital discharge, genital ulcers, hematuria, incontinence, irregular/heavy menses, nocturia or pelvic pain.   Objective:   BP (!) 160/115   Pulse 86   Ht 5\' 5"  (1.651 m)   Wt 138 lb 11.2 oz (62.9 kg)   LMP  (LMP Unknown)   Breastfeeding No   BMI 23.08 kg/m  CONSTITUTIONAL: Well-developed, well-nourished female in no acute distress.  HENT:  Normocephalic, atraumatic.  NECK: Normal range of motion, supple, no masses.  Normal thyroid.  SKIN: Skin is warm and dry. No rash noted. Not diaphoretic. No erythema. No pallor. NEUROLGIC: Alert and oriented to person, place, and time. PSYCHIATRIC: Normal mood and  affect. Normal behavior. Normal judgment and thought content. CARDIOVASCULAR:Not Examined RESPIRATORY: Not Examined BREASTS: Not Examined ABDOMEN: Soft, non distended; Non tender.  No Organomegaly. Distasis recti 1.5 finger breaths wide noted PELVIC: not indicated MUSCULOSKELETAL: Normal range of motion. No tenderness.  No cyanosis, clubbing, or edema.   Assessment:   1. Diastasis recti   Plan:   Discussed cause and occurrence common with pregnancy. Information sheet given. Discussed at home exercises to strengthen muscles. Pt states that she has not tried any specific exercises at home. Information given on specific exercise. Discussed referral to PT should improvement not occur after completing exercises for several months. She verbalizes and agrees. Follow PRN for annual & pap smear.    BP elevated on arrival state she was stressed due to work prior to visit. Repeat shows level declining.   , CNM

## 2021-09-09 NOTE — Patient Instructions (Signed)
Diastasis Recti  Diastasis recti is a condition in which the muscles of the abdomen (rectus abdominis muscles) become thin and separate. The result is a wider space between the muscles of the right and left abdomen (abdominal muscles). This wider space between the muscles may cause a bulge in the middle of the abdomen. This bulge may be noticed when a person is straining or when he or she sits up after lying down. Diastasis recti can affect men and women. It is most common among pregnant women, babies, people with obesity, and people who have had abdominal surgery. Exercise or surgery may help correct this condition. What are the causes? Common causes of this condition include: Pregnancy. As the uterus grows in size, it puts pressure on the abdominal muscles, causing the muscles to separate. Obesity. Excess fat puts pressure on abdominal muscles. Weight lifting. Some exercises of the abdomen. Advanced age. Genetics. Having had surgery on the abdomen before. What increases the risk? This condition is more likely to develop in: Women. Newborns, especially newborns who are born early (prematurely). What are the signs or symptoms? Common symptoms of this condition include: A bulge in the middle of your abdomen. You will notice it most when you sit up or strain. Pain in your low back, hips, or the area between your hip bones (pelvis). Constipation. Being unable to control when you urinate (urinary incontinence). Bloating. Poor posture. How is this diagnosed? This condition is diagnosed with a physical exam. During the exam, your health care provider will ask you to lie flat on your back and do a crunch or half sit-up. If you have diastasis recti, a bulge will appear lengthwise between your abdominal muscles in the center of your abdomen. Your health care provider will measure the gap between your muscles with one of the following: A medical device used to measure the space between two objects  (caliper). A tape measure. CT scan. Ultrasound. Finger spaces. Your health care provider will measure the space using his or her fingers. How is this treated? If your muscle separation is not too large, you may not need treatment. However, if you are a woman who plans to become pregnant again, you should treat this condition before your next pregnancy. Treatment may include: Physical therapy exercises to strengthen and tighten your abdominal muscles. Lifestyle changes such as weight loss and exercise. Over-the-counter pain medicines as needed. Surgery to correct the separation. Follow these instructions at home: Activity Return to your normal activities as told by your health care provider. Ask your health care provider what activities are safe for you. Do exercises as told by your health care provider. Make sure you are doing your exercises and movements correctly when lifting weights or doing exercises using your abdominal muscles or the muscles in the center of your body that give stability (core muscles). Proper form can help to prevent this condition from happening again. General instructions If you are overweight, ask your health care provider for help with weight loss. Losing even a small amount of weight can help to improve your diastasis recti. Take over-the-counter or prescription medicines only as told by your health care provider. Do not strain. Straining can make the separation worse. Examples of straining include: Pushing hard to have a bowel movement, such as when you have constipation. Lifting heavy objects or lifting children. Standing up and sitting down. You may need to take these actions to prevent or treat constipation: Drink enough fluid to keep your urine pale yellow. Take  over-the-counter or prescription medicines. Eat foods that are high in fiber, such as beans, whole grains, and fresh fruits and vegetables. Limit foods that are high in fat and processed sugars,  such as fried or sweet foods. Keep all follow-up visits. This is important. Contact a health care provider if: You notice a new bulge in your abdomen. Get help right away if: You experience severe discomfort in your abdomen. You develop severe abdominal pain along with nausea, vomiting, or a fever. Summary Diastasis recti is a condition in which the muscles of the abdomen (rectus abdominismuscles) become thin and separate. You may notice a bulge in your abdomen because the space has widened between the muscles of the right and left abdomen. The most common symptom is a bulge in the middle of your abdomen. You will notice it most when you sit up or strain. This condition is diagnosed with a physical exam. If the muscle separation is not too big, you may not need treatment. Otherwise, you may need to do physical therapy or have surgery. This information is not intended to replace advice given to you by your health care provider. Make sure you discuss any questions you have with your health care provider. Document Revised: 11/01/2019 Document Reviewed: 11/01/2019 Elsevier Patient Education  2023 [employer].

## 2021-10-05 ENCOUNTER — Encounter: Payer: BC Managed Care – PPO | Admitting: Family Medicine

## 2021-12-08 ENCOUNTER — Telehealth: Payer: Self-pay | Admitting: Family Medicine

## 2021-12-08 NOTE — Telephone Encounter (Signed)
Medication Refill - Medication: triamcinolone cream (KENALOG) 0.1   Has the patient contacted their pharmacy? Yes.   (Agent: If no, request that the patient contact the pharmacy for the refill. If patient does not wish to contact the pharmacy document the reason why and proceed with request.) (Agent: If yes, when and what did the pharmacy advise?) Call pcp since this RX is discontinued  Preferred Pharmacy (with phone number or street name): [facility]  Has the patient been seen for an appointment in the last year OR does the patient have an upcoming appointment? Yes.    Agent: Please be advised that RX refills may take up to 3 business days. We ask that you follow-up with your pharmacy.

## 2021-12-09 ENCOUNTER — Ambulatory Visit (INDEPENDENT_AMBULATORY_CARE_PROVIDER_SITE_OTHER): Payer: Medicaid Other | Admitting: Family Medicine

## 2021-12-09 ENCOUNTER — Encounter: Payer: Self-pay | Admitting: Family Medicine

## 2021-12-09 VITALS — BP 122/88 | HR 72 | Ht 65.0 in | Wt 147.0 lb

## 2021-12-09 DIAGNOSIS — L309 Dermatitis, unspecified: Secondary | ICD-10-CM | POA: Diagnosis not present

## 2021-12-09 MED ORDER — TRIAMCINOLONE ACETONIDE 0.1 % EX CREA: 1.0000 | TOPICAL_CREAM | Freq: Two times a day (BID) | CUTANEOUS | 0 refills | Status: DC

## 2021-12-09 NOTE — Progress Notes (Signed)
Date:  12/09/2021   Name:  Jamie Dudley   DOB:  13-Dec-1980   MRN:  [id number]   Chief Complaint: Rash (Uses triamcinolone cream)  Rash This is a new problem. The current episode started more than 1 year ago. The problem has been waxing and waning since onset. The affected locations include the left fingers. The rash is characterized by redness and peeling. Pertinent negatives include no cough, fatigue or sore throat. Past treatments include nothing.    Lab Results  Component Value Date   NA 139 10/21/2019   K 3.4 (L) 10/21/2019   CO2 27 10/21/2019   GLUCOSE 97 10/21/2019   BUN 20 10/21/2019   CREATININE 0.72 10/21/2019   CALCIUM 8.4 (L) 10/21/2019   GFRNONAA >60 10/21/2019   Lab Results  Component Value Date   CHOL 225 (H) 09/18/2017   HDL 61 09/18/2017   LDLCALC 147 (H) 09/18/2017   TRIG 85 09/18/2017   CHOLHDL 3.7 09/18/2017   Lab Results  Component Value Date   TSH 2.830 01/25/2017   No results found for: "HGBA1C" Lab Results  Component Value Date   WBC 7.1 10/21/2019   HGB 14.5 10/21/2019   HCT 44.0 10/21/2019   MCV 91.5 10/21/2019   PLT 227 10/21/2019   Lab Results  Component Value Date   ALT 14 10/21/2019   AST 15 10/21/2019   ALKPHOS 52 10/21/2019   BILITOT 1.5 (H) 10/21/2019   No results found for: "25OHVITD2", "25OHVITD3", "VD25OH"   Review of Systems  Constitutional:  Negative for fatigue and unexpected weight change.  HENT:  Negative for ear pain, sore throat and trouble swallowing.   Eyes:  Negative for visual disturbance.  Respiratory:  Negative for cough, chest tightness and wheezing.   Cardiovascular:  Negative for chest pain, palpitations and leg swelling.  Genitourinary:  Negative for difficulty urinating and menstrual problem.  Skin:  Positive for rash.    Patient Active Problem List   Diagnosis Date Noted   Diastasis recti 09/09/2021   Dyshidrotic eczema 10/02/2020   MDD (major depressive disorder), recurrent episode, moderate ([place])  09/18/2017   Atypical squamous cell changes of undetermined significance (ASCUS) on cervical cytology with positive high risk human papilloma virus (HPV) 07/31/2013   Picking own skin 07/17/2011    No Known Allergies  Past Surgical History:  Procedure Laterality Date   COLPOSCOPY     WISDOM TOOTH EXTRACTION      Social History   Tobacco Use   Smoking status: Never   Smokeless tobacco: Never  Vaping Use   Vaping Use: Never used  Substance Use Topics   Alcohol use: Not Currently    Comment: rare   Drug use: No     Medication list has been reviewed and updated.  Current Meds  Medication Sig   fluticasone (FLONASE) 50 MCG/ACT nasal spray Place 2 sprays into both nostrils daily.       12/09/2021    9:20 AM 10/01/2020   10:01 AM 09/20/2019    9:44 AM 05/01/2019   11:06 AM  GAD 7 : Generalized Anxiety Score  Nervous, Anxious, on Edge 0 1 0 0  Control/stop worrying 0 1 0 0  Worry too much - different things 0 0 0 0  Trouble relaxing 1 0 0 0  Restless 1 0 0 0  Easily annoyed or irritable 0 1 0 0  Afraid - awful might happen 0 0 0 0  Total GAD 7 Score 2 3 0  0  Anxiety Difficulty Not difficult at all Not difficult at all         12/09/2021    9:20 AM 10/01/2020   10:01 AM 09/20/2019    9:44 AM  Depression screen PHQ 2/9  Decreased Interest 0 0 0  Down, Depressed, Hopeless 1 0 0  PHQ - 2 Score 1 0 0  Altered sleeping 0 0 0  Tired, decreased energy 0 0 0  Change in appetite 0 0 0  Feeling bad or failure about yourself  0 0 0  Trouble concentrating 0 0 0  Moving slowly or fidgety/restless 0 0 0  Suicidal thoughts 0 0 0  PHQ-9 Score 1 0 0  Difficult doing work/chores Not difficult at all      BP Readings from Last 3 Encounters:  12/09/21 122/88  09/09/21 (!) 143/99  08/17/21 120/64    Physical Exam Vitals and nursing note reviewed. Exam conducted with a chaperone present.  Constitutional:      General: She is not in acute distress.    Appearance: She is not  diaphoretic.  HENT:     Head: Normocephalic and atraumatic.     Right Ear: Tympanic membrane and external ear normal.     Left Ear: Tympanic membrane and external ear normal.     Nose: Nose normal.     Mouth/Throat:     Mouth: Mucous membranes are moist.  Eyes:     General:        Right eye: No discharge.        Left eye: No discharge.     Conjunctiva/sclera: Conjunctivae normal.     Pupils: Pupils are equal, round, and reactive to light.  Neck:     Thyroid: No thyromegaly.     Vascular: No JVD.  Cardiovascular:     Rate and Rhythm: Normal rate and regular rhythm.     Heart sounds: Normal heart sounds. No murmur heard.    No friction rub. No gallop.  Pulmonary:     Effort: Pulmonary effort is normal.     Breath sounds: Normal breath sounds. No wheezing, rhonchi or rales.  Abdominal:     General: Bowel sounds are normal.     Palpations: Abdomen is soft. There is no mass.     Tenderness: There is no abdominal tenderness. There is no guarding.  Musculoskeletal:        General: Normal range of motion.     Cervical back: Normal range of motion and neck supple.  Lymphadenopathy:     Cervical: No cervical adenopathy.  Skin:    General: Skin is warm and dry.  Neurological:     Mental Status: She is alert.     Deep Tendon Reflexes: Reflexes are normal and symmetric.     Wt Readings from Last 3 Encounters:  12/09/21 147 lb (66.7 kg)  09/09/21 138 lb 11.2 oz (62.9 kg)  08/17/21 140 lb (63.5 kg)    BP 122/88   Pulse 72   Ht 5\' 5"  (1.651 m)   Wt 147 lb (66.7 kg)   LMP 11/25/2021 (Approximate)   BMI 24.46 kg/m   Assessment and Plan:  1. Eczema, unspecified type New onset.  Uncontrolled.  Relatively stable on the limited to the right thumb.  Patient has had a history of eczema and is similar to other outbreaks.  We will treat with triamcinolone cream 0.1% apply twice a day. - triamcinolone cream (KENALOG) 0.1 %; Apply 1 Application topically 2 (two)  times daily.  Dispense:  30 g; Refill: 0    Elizabeth Sauer, MD

## 2022-03-09 ENCOUNTER — Ambulatory Visit (INDEPENDENT_AMBULATORY_CARE_PROVIDER_SITE_OTHER): Payer: BC Managed Care – PPO | Admitting: Certified Nurse Midwife

## 2022-03-09 ENCOUNTER — Other Ambulatory Visit (HOSPITAL_COMMUNITY)
Admission: RE | Admit: 2022-03-09 | Discharge: 2022-03-09 | Disposition: A | Discharge status: Home / Self Care | Payer: BC Managed Care – PPO | Source: Ambulatory Visit | Attending: Certified Nurse Midwife | Admitting: Certified Nurse Midwife

## 2022-03-09 ENCOUNTER — Encounter: Payer: Self-pay | Admitting: Certified Nurse Midwife

## 2022-03-09 VITALS — BP 139/98 | HR 82 | Resp 16 | Ht 65.0 in | Wt 148.5 lb

## 2022-03-09 DIAGNOSIS — Z124 Encounter for screening for malignant neoplasm of cervix: Secondary | ICD-10-CM | POA: Diagnosis present

## 2022-03-09 DIAGNOSIS — Z1231 Encounter for screening mammogram for malignant neoplasm of breast: Secondary | ICD-10-CM

## 2022-03-09 DIAGNOSIS — Z1339 Encounter for screening examination for other mental health and behavioral disorders: Secondary | ICD-10-CM

## 2022-03-09 DIAGNOSIS — Z01419 Encounter for gynecological examination (general) (routine) without abnormal findings: Secondary | ICD-10-CM

## 2022-03-09 NOTE — Patient Instructions (Addendum)
Kegel Exercises  Kegel exercises can help strengthen your pelvic floor muscles. The pelvic floor is a group of muscles that support your rectum, small intestine, and bladder. In females, pelvic floor muscles also help support the uterus. These muscles help you control the flow of urine and stool (feces). Kegel exercises are painless and simple. They do not require any equipment. Your provider may suggest Kegel exercises to: Improve bladder and bowel control. Improve sexual response. Improve weak pelvic floor muscles after surgery to remove the uterus (hysterectomy) or after pregnancy, in females. Improve weak pelvic floor muscles after prostate gland removal or surgery, in males. Kegel exercises involve squeezing your pelvic floor muscles. These are the same muscles you squeeze when you try to stop the flow of urine or keep from passing gas. The exercises can be done while sitting, standing, or lying down, but it is best to vary your position. Ask your health care provider which exercises are safe for you. Do exercises exactly as told by your health care provider and adjust them as directed. Do not begin these exercises until told by your health care provider. Exercises How to do Kegel exercises: Squeeze your pelvic floor muscles tight. You should feel a tight lift in your rectal area. If you are a female, you should also feel a tightness in your vaginal area. Keep your stomach, buttocks, and legs relaxed. Hold the muscles tight for up to 10 seconds. Breathe normally. Relax your muscles for up to 10 seconds. Repeat as told by your health care provider. Repeat this exercise daily as told by your health care provider. Continue to do this exercise for at least 4-6 weeks, or for as long as told by your health care provider. You may be referred to a physical therapist who can help you learn more about how to do Kegel exercises. Depending on your condition, your health care provider may  recommend: Varying how long you squeeze your muscles. Doing several sets of exercises every day. Doing exercises for several weeks. Making Kegel exercises a part of your regular exercise routine. This information is not intended to replace advice given to you by your health care provider. Make sure you discuss any questions you have with your health care provider. Document Revised: 07/09/2020 Document Reviewed: 07/09/2020 Elsevier Patient Education  [place] 41-73 Years Old, Female Preventive care refers to lifestyle choices and visits with your health care provider that can promote health and wellness. Preventive care visits are also called wellness exams. What can I expect for my preventive care visit? Counseling During your preventive care visit, your health care provider may ask about your: Medical history, including: Past medical problems. Family medical history. Pregnancy history. Current health, including: Menstrual cycle. Method of birth control. Emotional well-being. Home life and relationship well-being. Sexual activity and sexual health. Lifestyle, including: Alcohol, nicotine or tobacco, and drug use. Access to firearms. Diet, exercise, and sleep habits. Work and work Statistician. Sunscreen use. Safety issues such as seatbelt and bike helmet use. Physical exam Your health care provider may check your: Height and weight. These may be used to calculate your BMI (body mass index). BMI is a measurement that tells if you are at a healthy weight. Waist circumference. This measures the distance around your waistline. This measurement also tells if you are at a healthy weight and may help predict your risk of certain diseases, such as type 2 diabetes and high blood pressure. Heart rate and blood pressure. Body temperature. Skin  for abnormal spots. What immunizations do I need?  Vaccines are usually given at various ages, according to a schedule.  Your health care provider will recommend vaccines for you based on your age, medical history, and lifestyle or other factors, such as travel or where you work. What tests do I need? Screening Your health care provider may recommend screening tests for certain conditions. This may include: Pelvic exam and Pap test. Lipid and cholesterol levels. Diabetes screening. This is done by checking your blood sugar (glucose) after you have not eaten for a while (fasting). Hepatitis B test. Hepatitis C test. HIV (human immunodeficiency virus) test. STI (sexually transmitted infection) testing, if you are at risk. BRCA-related cancer screening. This may be done if you have a family history of breast, ovarian, tubal, or peritoneal cancers. Talk with your health care provider about your test results, treatment options, and if necessary, the need for more tests. Follow these instructions at home: Eating and drinking  Eat a healthy diet that includes fresh fruits and vegetables, whole grains, lean protein, and low-fat dairy products. Take vitamin and mineral supplements as recommended by your health care provider. Do not drink alcohol if: Your health care provider tells you not to drink. You are pregnant, may be pregnant, or are planning to become pregnant. If you drink alcohol: Limit how much you have to 0-1 drink a day. Know how much alcohol is in your drink. In the [place] one drink equals one 12 oz bottle of beer (355 mL), one 5 oz glass of wine (148 mL), or one 1 oz glass of hard liquor (44 mL). Lifestyle Brush your teeth every morning and night with fluoride toothpaste. Floss one time each day. Exercise for at least 30 minutes 5 or more days each week. Do not use any products that contain nicotine or tobacco. These products include cigarettes, chewing tobacco, and vaping devices, such as e-cigarettes. If you need help quitting, ask your health care provider. Do not use drugs. If you are sexually  active, practice safe sex. Use a condom or other form of protection to prevent STIs. If you do not wish to become pregnant, use a form of birth control. If you plan to become pregnant, see your health care provider for a prepregnancy visit. Find healthy ways to manage stress, such as: Meditation, yoga, or listening to music. Journaling. Talking to a trusted person. Spending time with friends and family. Minimize exposure to UV radiation to reduce your risk of skin cancer. Safety Always wear your seat belt while driving or riding in a vehicle. Do not drive: If you have been drinking alcohol. Do not ride with someone who has been drinking. If you have been using any mind-altering substances or drugs. While texting. When you are tired or distracted. Wear a helmet and other protective equipment during sports activities. If you have firearms in your house, make sure you follow all gun safety procedures. Seek help if you have been physically or sexually abused. What's next? Go to your health care provider once a year for an annual wellness visit. Ask your health care provider how often you should have your eyes and teeth checked. Stay up to date on all vaccines. This information is not intended to replace advice given to you by your health care provider. Make sure you discuss any questions you have with your health care provider. Document Revised: 08/26/2020 Document Reviewed: 08/26/2020 Elsevier Patient Education  [place].

## 2022-03-09 NOTE — Progress Notes (Signed)
GYNECOLOGY ANNUAL PREVENTATIVE CARE ENCOUNTER NOTE  History:     Toryn Marchesi is a 41 y.o. [id number] female here for a routine annual gynecologic exam.  Current complaints: requesting evaluation for ADHD and autism. States she has been doing some on line investigation and feels like she may have.   Denies abnormal vaginal bleeding, discharge, pelvic pain, problems with intercourse or other gynecologic concerns.     Social Relationship: married Living: with spouse and child Work: Company secretary Exercise: none Smoke/Alcohol/drug use: denies use   Gynecologic History No LMP recorded (within weeks). Contraception:  natural family planning  Last Pap: 09/08/2016. Results were: normal with negative HPV Last mammogram: ordered.    [facility]stream - 03/09/22 0840       Pregnancy Intention Screening   Does the patient want to become pregnant in the next year? No    Does the patient's partner want to become pregnant in the next year? No    Would the patient like to discuss contraceptive options today? No      Contraception Wrap Up   Current Method Unknown/Not Reported    End Method Unknown/Not Reported    Contraception Counseling Provided No            The pregnancy intention screening data noted above was reviewed. Potential methods of contraception were discussed. The patient elected to proceed with Natural family planning.   Obstetric History OB History  Gravida Para Term Preterm AB Living  1 1 1  0 0 1  SAB IAB Ectopic Multiple Live Births  0 0 0 0 1    # Outcome Date GA Lbr Len/2nd Weight Sex Delivery Anes PTL Lv  1 Term 11/21/18 [redacted]w[redacted]d / 01:09 7 lb 7.9 oz (3.4 kg) M Vag-Spont None  LIV    Past Medical History:  Diagnosis Date   Atypical squamous cells of undetermined significance (ASC-US) on cervical Pap smear    Depression    Sinusitis    Skin-picking disorder    Tendonitis     Past Surgical History:  Procedure Laterality Date   COLPOSCOPY     WISDOM  TOOTH EXTRACTION      Current Outpatient Medications on File Prior to Visit  Medication Sig Dispense Refill   fluticasone (FLONASE) 50 MCG/ACT nasal spray Place 2 sprays into both nostrils daily. 16 g 0   triamcinolone cream (KENALOG) 0.1 % Apply 1 Application topically 2 (two) times daily. 30 g 0   No current facility-administered medications on file prior to visit.    No Known Allergies  Social History:  reports that she has never smoked. She has never used smokeless tobacco. She reports that she does not currently use alcohol. She reports that she does not use drugs.  Family History  Problem Relation Age of Onset   Heart disease Paternal Grandfather    Hypertension Mother    Breast cancer Neg Hx    Ovarian cancer Neg Hx    Colon cancer Neg Hx     The following portions of the patient's history were reviewed and updated as appropriate: allergies, current medications, past family history, past medical history, past social history, past surgical history and problem list.  Review of Systems Pertinent items noted in HPI and remainder of comprehensive ROS otherwise negative.  Physical Exam:  BP (!) 139/98   Pulse 82   Resp 16   Ht 5\' 5"  (1.651 m)   Wt 148 lb 8 oz (67.4 kg)   LMP  (  Within Weeks) Comment: patient reports 3 weeks ago  BMI 24.71 kg/m  CONSTITUTIONAL: Well-developed, well-nourished female in no acute distress.  HENT:  Normocephalic, atraumatic, External right and left ear normal. Oropharynx is clear and moist EYES: Conjunctivae and EOM are normal. Pupils are equal, round, and reactive to light. No scleral icterus.  NECK: Normal range of motion, supple, no masses.  Normal thyroid.  SKIN: Skin is warm and dry. No rash noted. Not diaphoretic. No erythema. No pallor. MUSCULOSKELETAL: Normal range of motion. No tenderness.  No cyanosis, clubbing, or edema.  2+ distal pulses. NEUROLOGIC: Alert and oriented to person, place, and time. Normal reflexes, muscle tone  coordination.  PSYCHIATRIC: Normal mood and affect. Normal behavior. Normal judgment and thought content. CARDIOVASCULAR: Normal heart rate noted, regular rhythm RESPIRATORY: Clear to auscultation bilaterally. Effort and breath sounds normal, no problems with respiration noted. BREASTS: Symmetric in size. No masses, tenderness, skin changes, nipple drainage, or lymphadenopathy bilaterally.  ABDOMEN: Soft, no distention noted.  No tenderness, rebound or guarding.  PELVIC: Normal appearing external genitalia and urethral meatus; normal appearing vaginal mucosa and cervix.  No abnormal discharge noted.  Pap smear obtained.  Normal uterine size, no other palpable masses, no uterine or adnexal tenderness.  .   Assessment and Plan:    1. Women's annual routine gynecological examination   Pap: Will follow up results of pap smear and manage accordingly. Mammogram : ordered Labs: none Refills: none Referral: behavior health eval of ADHD/Autism  Routine preventative health maintenance measures emphasized. Please refer to After Visit Summary for other counseling recommendations.      Doreene Burke, CNM [facility]  [facility],  [facility]

## 2022-03-11 LAB — CYTOLOGY - PAP
Comment: NEGATIVE
Diagnosis: NEGATIVE
High risk HPV: NEGATIVE

## 2022-03-15 ENCOUNTER — Ambulatory Visit
Admission: RE | Admit: 2022-03-15 | Discharge: 2022-03-15 | Disposition: A | Discharge status: Home / Self Care | Payer: BC Managed Care – PPO | Source: Ambulatory Visit | Attending: Certified Nurse Midwife | Admitting: Certified Nurse Midwife

## 2022-03-15 DIAGNOSIS — Z1231 Encounter for screening mammogram for malignant neoplasm of breast: Secondary | ICD-10-CM | POA: Diagnosis not present

## 2022-03-15 DIAGNOSIS — Z01419 Encounter for gynecological examination (general) (routine) without abnormal findings: Secondary | ICD-10-CM | POA: Diagnosis present

## 2022-04-21 ENCOUNTER — Encounter: Payer: Self-pay | Admitting: Certified Nurse Midwife

## 2022-04-22 ENCOUNTER — Other Ambulatory Visit: Payer: Self-pay | Admitting: Certified Nurse Midwife

## 2022-09-29 IMAGING — CR DG TIBIA/FIBULA 2V*R*
1 series · 2 of 2 positions shown · non-contrast
Comparison: None Available.

CLINICAL DATA: Right leg pain, tripping injury

EXAM:
RIGHT TIBIA AND FIBULA - 2 VIEW

[Series 1: dg tibia/fibula right · 0.14mm/px · 2 of 2 slices shown]
[im 1/2]
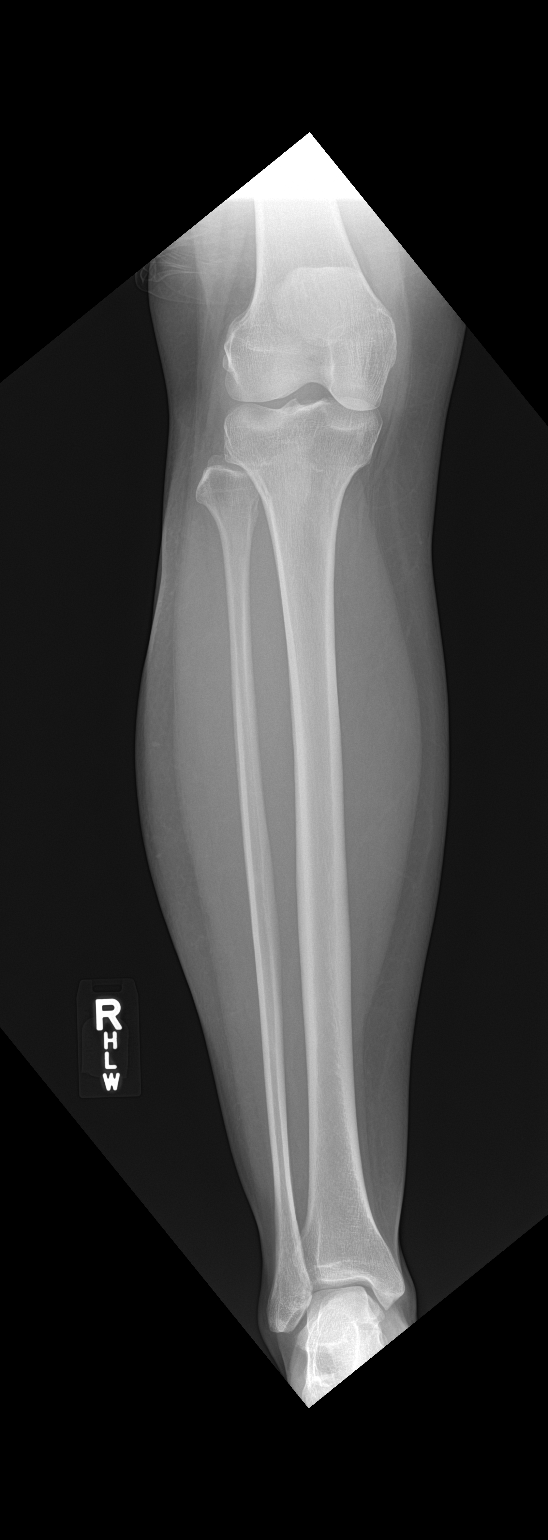
[im 2/2]
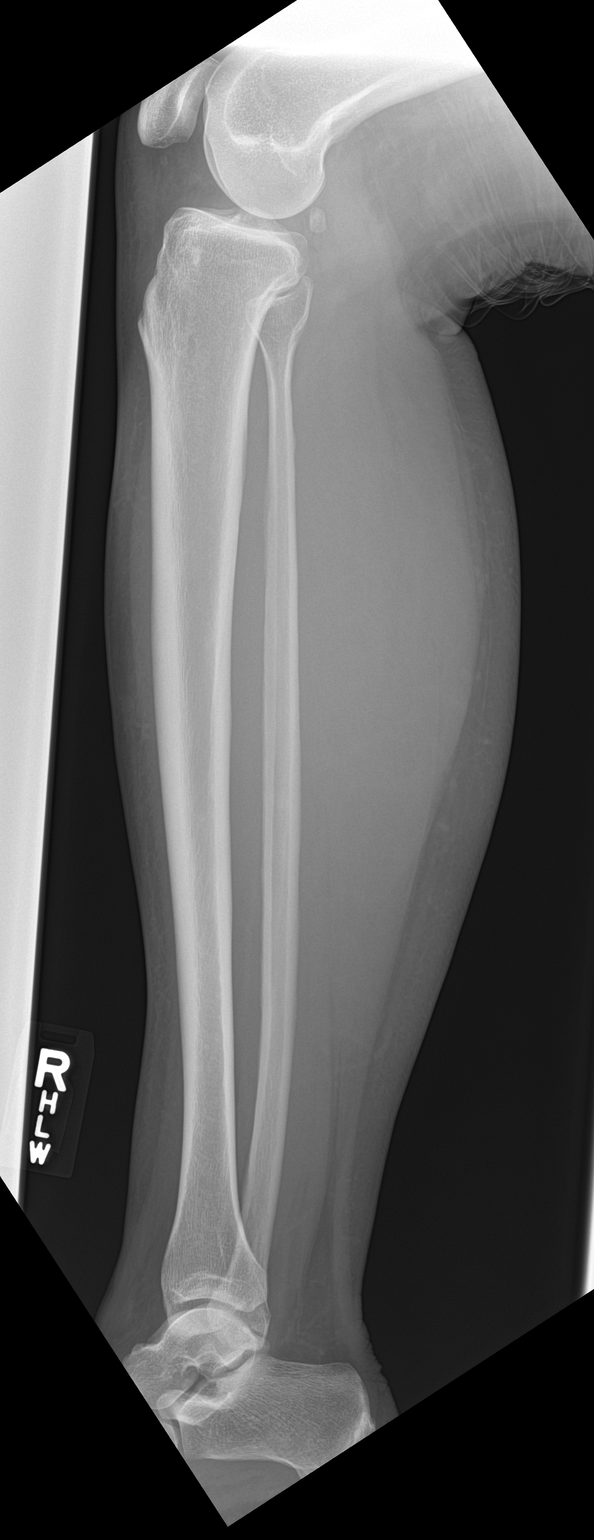

[2 of 2 positions shown; findings below may reference images not displayed]

FINDINGS: There is no evidence of fracture or other focal bone lesions. Soft
tissues are unremarkable.
IMPRESSION: No acute abnormality

## 2022-11-24 ENCOUNTER — Telehealth: Payer: Self-pay | Admitting: Family Medicine

## 2022-11-24 NOTE — Telephone Encounter (Signed)
Please review.  KP

## 2022-11-24 NOTE — Telephone Encounter (Signed)
Copied from CRM [id number]. Topic: General - Inquiry >> Nov 24, 2022  1:45 PM Lennox Pippins wrote: Patient called and stated she would like to switch to another PCP in the office. She stated Dr Yetta Barre brushed off one of her issues as "allergies" and that now she has other problems and she really does not feel comfortable discussing these issues with Dr Yetta Barre but wants to stay within this office. Please advise. Patient prefers another female provider. Patients callback # [phone]

## 2022-11-25 NOTE — Telephone Encounter (Signed)
Please call pt and see if he wants to see Jesusita Oka.  KP

## 2022-11-25 NOTE — Telephone Encounter (Signed)
Noted  KP

## 2022-12-22 NOTE — Telephone Encounter (Signed)
Pt is calling to f/u on her previous request about switching providers. Gave pt message below from [facility], [place], 11/25/22, 12:30 PM again. The patient is welcome to switch to [facility] or wait until Dr. Elaina Pattee starts in mid-November.  Pt verbalized understanding and stated she could not remember if October or November.

## 2022-12-29 ENCOUNTER — Ambulatory Visit (INDEPENDENT_AMBULATORY_CARE_PROVIDER_SITE_OTHER): Payer: BC Managed Care – PPO | Admitting: Clinical

## 2022-12-29 DIAGNOSIS — F419 Anxiety disorder, unspecified: Secondary | ICD-10-CM | POA: Diagnosis not present

## 2022-12-29 NOTE — Progress Notes (Signed)
[facility] Behavioral Health Counselor Initial Visit  Name: Jamie Dudley Date: 12/29/2022 MRN: [id number] DOB: 31-May-1980  PCP: Duanne Limerick, MD Time Spent: 1:03  pm - 2:10 pm: 67 Minutes    CPT Code: 66440 Type of Service Provided Psychological Testing (Intake visit) Type of Contact virtual (via Caregility with real time audio and visual interaction)  Patient Location: at work  Provider Location: office  Visit Information: [name] presented for an intake for an evaluation. Interview was conducted via telehealth and Zenora  verbally consented to telehealth. Patient consented to a telehealth visit and is aware of and consented to the limitations of telehealth. Confidentiality and the limits of confidentiality were reviewed, along with practice consents.   Background information and information about concerns was gathered. Safety concerns were not reported.   Specifics of proposed evaluation discussed with Graciana and  Giovanna and examiner agreed to move forward with an evaluation. Please see below for additional information.   Intake for an Evaluation  Reason for Visit: [name] was seen for an intake for an evaluation. Concerns expressed by Kateena include her attention and organizational skills, social interaction skills, anxiety, and mood.   Relevant Background Information The following background information was obtained from an interview completed with Mee. The accuracy of the background information is contingent upon the reliability of the responses provided.  Mental Status Exam: Appearance:  Casual and Well Groomed     Behavior: Appropriate and Sharing  Motor: Restlestness  Speech/Language:  Clear and Coherent and Normal Rate  Affect: Appropriate  Mood: normal  Thought process: normal  Thought content:   WNL  Sensory/Perceptual disturbances:   WNL  Orientation: oriented to person, place, time/date, and situation  Attention: Good  Concentration: Good  Memory: WNL  Fund of knowledge:  Fair   Insight:   Fair  Judgment:  Good  Impulse Control: Good   Reported Symptoms:  [name] has wondered if she may have something like ASD or AD/HD and was hoping that an evaluation would help to clarify things. She was also hoping that an evaluation would be able to provide some recommendations for more targeted therapy.   Pregnancy and Birth Information Medication during pregnancy: No                                    Exposure to substances or potentially harmful events in utero: No  Complications during pregnancy/delivery: No Length of pregnancy: born 3 days late   Delivery method: vaginal   Complications post-delivery: No   Developmental Milestones History of developmental/behavioral concerns/differences: [name] reported that in childhood, she used to put her feet onto her chair and crouch/squat instead of sit on chairs as expected. Although she had some friends in elementary school, she recalls regularly feeling as though she has been left out of the conversation (e.g., peers would talk about shows they were watching on TV so she thought that she should start watching TV so she would have something to talk with them about). There were children that she would call "friends" but she did not know their name (e.g., in the 5th grade she sat with a peer at lunch but did not know their name). She would also bite her nails and pick her skin in childhood and there was time when she liked to take a safety pin and stick it in the first layer of the skin on her thumb.   Current/Past Speech/Language concerns:  [name] recalled that there was talk of her seeing a speech therapist around the 5th grade, which may have been related to her articulation.   Milestones: [name] reported that she was unsure of when she reached her developmental milestones but thought that she may have started using sentences around the age of 2 years.   Any loss of previous attained skills: No   Medical History: Medical or psychiatric  concerns or diagnoses: depression and anxiety    Medical History from Chart Past Medical History:  Diagnosis Date   Atypical squamous cells of undetermined significance (ASC-US) on cervical Pap smear    Depression    Sinusitis    Skin-picking disorder    Tendonitis                                          Significant accidents, hospitalizations, surgeries, or infections: No  Past Surgical History:  Procedure Laterality Date   COLPOSCOPY     WISDOM TOOTH EXTRACTION                          Allergies: No                                                                                      Currently taking any medication:No   Current/past eating/feeding concerns: [name] does not like unexpected textures (e.g., biting into a brownie and finding a nut).                                                  Current/past sleeping concerns: [name] reported that it is almost "too easy" for her to fall asleep because when she is tired she will fall asleep even if she is involved in another activity (e.g., she has fallen asleep during a conversation or when doing things on the computer).                             Hygiene concerns/changes: No                                           Trauma and Abuse History: Current/past exposure to traumas and/or significant stressors (e.g., abuse, witness to violence, fires, significant car accidents): No  - [name] did experience some stressors, however, as her parents separated when she was around the age of 42 and there was a lot of fighting before that.   Abuse History:  Victim of abuse: No Report needed: No. Victim of Neglect:No. Witness / Exposure to Domestic Violence: No   Protective Services Involvement: No  Witness to [employer] Violence:  No   Psychiatric History Current/past aggressive behavior: [name] is usually pretty even and does not show a lot of emotions. However, fluctuations  in hormones can impact her level of anger/irritation. For example, she noted  that her temper tended to rise more quickly when she was pregnant and at certain points during her monthly cycle. At these times she feels less in control of her emotions and may feel her frustration building to the point that she wants to yell or punch a pillow.                          Current/past significant behavioral concerns (e.g., stealing, fire setting, annoying other on purpose or easily annoyed by others): No   Current/past hearing/seeing things not there or expressing unusual beliefs/ideas: No                          Current/past mood concerns (depressed or unusually elevated moods): Dayzee reported that her typical mood is "tired". She tends to be very passive generally, and appears calm, but she described this as "not a peaceful calm" and as more of a "flat calm".   She was diagnosed with depression a few years ago. Currently, her depression is "okay" most of the time but there was a day recently when she felt like there was a weight on her and she did not want to do anything.   Maeci also occasionally experiences an elevated mood (days when she feels restless and wants to get out and do something but does not "know what that something is"). These moods tend to be brief, lasting for a few hours.                                  Current/past anxiety concerns (separation, social, general): Janira reported that she is anxious about many things including if she is doing "the right thing in the right way", if she is going to fail, if someone is going to become angry with her, if someone is going to think that she is weird, if she is pushing people away by not saying something, if she is going to drive someone away somehow, etc. Charman reported that these worries are not unfounded, as it has been challenging for her to keep friends. Tylynn reported that she has experienced anxiety since middle school.   Current/past obsessions (bothersome recurrent and persistent thoughts) or compulsions: Jaasia has a history  of skin picking. She wants to leave her skin along and knows that it would look better if she could leave it alone, but ends up doing it (e.g., she feels like she needs something to do with her hands). Vanetta reported that her brain will also remind her of something that she said "20 years ago" and then she spends time thinking about this statement.   Concerns regarding attention/focus/impulsivity: There are times when Paiden can really "dig into something", especially when it is new (e.g., he may get very involved in a work task and spend 2 hours engaged without noticing). However, at other times she may find a work task boring and then will spend too much time trying to find something to listen to in the background, for example. She has a hard time with face-to-face conversations. She finds the phone a bit easier and text the easiest because she can respond in her own time. Janyia can also be impulsive (e.g., she will sometimes get it in her head to do something and then do it, and  her husband will ask why she did not run it by him first). She tends to be very organized at work, because if she is not she will forget. She also forgets important things sometimes too.   Current/past social concerns and/or restricted or repetitive behaviors: Nesha reported that she finds making and keeping friends challenging. For example, in middle school she had a best friend, but this person just stopped talking to her. In high school, Denisha had friends but did not recognize them as such. In college, she had a friend that stopped talking to her and "ghosted her". More recently, Kariana's best friend of 15 years decided that she needed space from Lexee, and Brigida still does not know why this occurred (her friend reportedly said that she felt that Blessing had taken advantage of her but Victoire did not understand what she meant by that). Conversations can be challenging for because she does not know what to talk about and tries to practice "reflective  listening" but still finds conversations challenging. She also dislikes eye contact and will typically look at someone's forehead or mouth rather than directly in the eye.   Ernesto also engages in some repetitive movements, such as stretching her fingers and loosening and tightening her watch band repeatedly. Infant does not have difficulty with transitions or changes that she initiates, but if she is not the making the change or if she does not know about a change or transitions ahead of time, she struggles.   Regarding sensory, Natalyn hates polyester fabrics. She has sometimes "made the mistake" of purchasing clothing that she liked the look up but ended up not wearing because she "cannot stand the physical feel" of the polyester clothing. She also dislikes sticky textures and her hands being messy (she regularly has to clean off her hands if she is eating something that leaves something on her fingers) and dislikes touching slime.    ASD? - behaviors that have lead Maciel to questioning ASD for herself include the feelings of "otherness", motor planning challenges,  social challenges, and having difficulty with vague tasks.   Current/past substance use/abuse: No                            Current/past legal involvement or issues: No    Risk Assessment: Current/past suicidal ideation: No                                                                                  Current/past homicidal ideation: No   Danger to Self:  No Self-injurious Behavior: No Danger to Others: No Duty to Warn:no Physical Aggression / Violence:No  Access to Firearms a concern:  unknown Gang Involvement: unknown  While future psychiatric events cannot be accurately predicted, the patient does not currently require acute inpatient psychiatric care and does not currently meet [facility] involuntary commitment criteria.  Past Interventions Current/past services/interventions: mental health therapy     Outpatient Providers:  [facility]   History of Psych Hospitalization: No  Work, Programmer, multimedia, and Assessment History   Current school attendance: Kirstin is not currently a Consulting civil engineer. She has a bachelor's degree.   Attended public or private schools in childhood: During childhood Shaquna attended a combination of private and public schools.    Academic Concerns: No - However, although Khyra would keeping herself organized at school, she would also sometimes complete her homework, stuff it into her backpack, and then not be able to find the next day. As she aged and went further in school she also tended to procrastinate more, as she would put off work until right before it was due and then have to rush to get it done.   Ever repeated a grade: No Records of prior testing: No     Current/past IEP or 504 Plan:  No                                                       Any formal or informal accommodations/support in school or out of school (e.g., private tutoring): No       Work history/current work: Yes - Chief Operating Officer: No   Family and Social History                                                                   Language(s) spoken in the home/primary language: English   With whom does the individual reside:  live with husband and son (37 years old)                                                                       Family History:  Family History  Problem Relation Age of Onset   Heart disease Paternal Grandfather    Hypertension Mother    Breast cancer Neg Hx    Ovarian cancer Neg Hx    Colon cancer Neg Hx    Medical/psychiatric concerns in immediate family history: sister suspects that she has ASD and Sinai has some concerns about her son's development.   Medical/psychiatric concerns in extended maternal family history: uncle has bipolar    Medical/psychiatric concerns in extended paternal family history: No          Consultations  necessary/requested: at this time Shelita is not sure if she wants to share with anyone else that she is seeking this evaluation, so will plan to gather information from her first and the see if she has someone that she would be comfortable with.   Relationship Status: married  Name of spouse / other: Christiane Ha  If a parent, number of children / ages:54 year old son  Pronouns: she/her  Support Systems: husband, sister   Surveyor, quantity Stress:  one income household Stressors in last 6 months: Rotha described herself as "conflict avoidant" and noted that she feels anxious whenever she and her husband have a disagreement.  Any cultural differences that may affect treatment:  not applicable   Recreation/Hobbies: Marca likes to crochet and started this activity as a way to keep her hands busy. She also likes reading and playing video games and used to play the piano.   Strengths: Self-identified strengths include that Ronneisha is organized at work. She is generally friend to people, smiles, make an effort to talk to people, and does not treat anyone poorly.   Plan: Intake completed on 12/29/22. Concerns noted at the time included her attention and organizational skills, social interaction skills, anxiety, and mood. Thus, Amywill return for an evaluation focused on potential attention deficit/hyperactivity disorder, autism spectrum disorder, anxiety, and/or depression/mood.   Testing is expected to answer the question, does the individual meet criteria for Autism Spectrum Disorder, ADHD, mood disorder, and/or anxiety disorder when age, other concerns, and cognitive functioning are taken into consideration? Further testing is warranted because a diagnosis cannot be given based on current interview data (further data is required). Psychological testing results are expected to answer the remaining diagnostic questions in order to provide an accurate diagnosis. Psychological testing results are expected to assist in  treatment planning with an expectation of improved clinical outcome.   Current working diagnosis: Anxiety F41.9  Diagnoses to consider  R/O Autism Spectrum Disorder F84.0 R/O Attention Deficit Hyperactivity Disorder F90. R/O Generalized Anxiety Disorder F41.1 R/O Mood Disorder F39 R/O Social Anxiety Disorder F40.10 R/O Separation Disorder F93.0 R/O Major Depressive Disorder F33  Proposed Test Battery:  Stanford-Binet Intelligence Scale - 5 OR Wechsler Adult Intelligence Scale-Fourth Edition Autism Diagnostic Observation Schedule (ADOS-2) Vineland Adaptive Behavior Scales  Social Responsiveness Scale-2 DSM-5 Cross Cutting Measure and indicated follow up measures CNS Vital Signs Connor's  Early Symptoms Measure   [name], PhD

## 2023-01-09 ENCOUNTER — Ambulatory Visit (INDEPENDENT_AMBULATORY_CARE_PROVIDER_SITE_OTHER): Payer: Self-pay | Admitting: Clinical

## 2023-01-09 DIAGNOSIS — F419 Anxiety disorder, unspecified: Secondary | ICD-10-CM

## 2023-01-09 NOTE — Progress Notes (Signed)
Testing Visit Documentation    Name: Jamie Dudley       MRN: [id number]  Date of Birth: 1980/04/27  Age: 42 y.o.  Date of Visit 01/09/23    Type of Service Provided Psychological Testing  Type of Contact: in-person  Location: office  Session Note: Jamie Dudley presented for testing session. No new concerns were reported since the last visit.   The following tests were administered and/or scored: WAIS-IV, ADOS-2, CAARS-S, DSM-5 Cross Cutting measure and indicated follow up measures. CAT-Q, CNS Vital Signs and ADHD Questionnaire, SRS-2.    The following assessments were sent: SRS-2 other, BRIEF (self and other report) The following assessments were taken by Grisel: early history form (AD/HD and ASD), developmental history from    Mental Status Exam: Appearance:  Casual     Behavior: Appropriate  Motor: Restlestness  Speech/Language:  Clear and Coherent  Affect: Appropriate  Mood: nervous  Thought process: normal  Thought content:   WNL  Sensory/Perceptual disturbances:   WNL  Orientation: oriented to person, place, time/date, and situation  Attention: Good  Concentration: Good  Memory: WNL  Fund of knowledge:  Good  Insight:   Fair  Judgment:  Good  Impulse Control: Good    During the intake visit, Jamie Dudley was unsure if she willing to have anyone answer questions about her for the current evaluation. During the current visit, She was provided with several paper forms that are intended to be completed by someone that knew her in childhood to review. She indicated that she thought that her sister may be able to complete some of the forms, though her sister is only 6 years older than her. She was also emailed forms for someone that knows her well now to complete, but she was unsure of who she was going to ask. The examiner requested that Jamie Dudley reach out to let her know when she decided on someone to complete the forms about current behavior, or if she ultimately decided that she was not comfortable with  anyone completing them for now, to let the examiner know. Because at this point she does not want to involve her spouse in testing the Vineland was NOT sent.   Plan: Jamie Dudley will return for an additional testing session.   A report will be included in the chart once the evaluation is complete.   Time Spent:   Test Administration (Face-to-Face): 01/09/2023; 8:55 am - 12:40 pm  (225 minutes)  Scoring (non-face-to-face): 01/09/2023; 1:10 pm - 1:55 pm  (45 minutes)  Initial integration/Report Generation: 01/09/2023; , 2:10 pm - 2:25 pm, 3:18 pm - 3:55 pm & 5:10 pm - 5:25 pm  (67 minutes)  To be billed once evaluation is complete on last date of service:  96136 = 1 unit  96137 = 8 units 96130 = 1 unit   [name], PhD

## 2023-01-24 ENCOUNTER — Ambulatory Visit: Payer: BC Managed Care – PPO | Admitting: Clinical

## 2023-01-24 DIAGNOSIS — F419 Anxiety disorder, unspecified: Secondary | ICD-10-CM

## 2023-01-24 NOTE — Progress Notes (Signed)
Testing Visit Documentation    Name: Jamie Dudley       MRN: [id number]  Date of Birth: [birth year]        Age: 42 y.o.  Date of Visit 01/24/23    Type of Service Provided Psychological Testing  Type of Contact virtual (via [employer] with real time audio and visual interaction)  Patient Location: work  Provider Location: office   Session Note: Jamie Dudley presented for testing session. The session was completed via telehealth. The patient consented to telehealth and was informed of and consented to the limitations of telehealth.   The following tests were administered, reviewed, and/or scored: AD/HD questionnaires, BRIEF Jamie Dudley also participated in a semi-structured interview regarding symptoms of ASD, AD/HD, anxiety and mood.   Mental Status Exam: Appearance:  Casual     Behavior: Appropriate  Motor: Restlestness  Speech/Language:  Clear and Coherent and Normal Rate  Affect: Appropriate - a bit flat   Mood: normal  Thought process: normal  Thought content:   WNL  Sensory/Perceptual disturbances:   WNL  Orientation: oriented to person, place, time/date, and situation  Attention: Good  Concentration: Good  Memory: WNL  Fund of knowledge:  Fair  Insight:   Fair  Judgment:  Good  Impulse Control: Good   Observations: fidgety - some extended pauses, some touching/seeming pinching of neck/chin  Outstanding information includes: SRS-2 other, AD/HD questionnaire about self   Plan: Jamie Dudley  will return for feedback.   A report will be included in the chart once the evaluation is complete.   Time Spent:  Test Administration (virtual): 01/24/2023; 8:50 am - 10:51 am   (121 minutes)  Scoring (non-face-to-face): 01/24/2023; 8:20 am - 8:30 am & 10:51 am - 10:56 am (15 minutes)  Initial integration/Report Generation: 01/24/2023; 8:35 am - 8:50 am, 11:10 am - 12:10 pm, 7:00 pm - 8:00 pm & 9:00 pm - 9:20 pm  (155 minutes)  To be billed once evaluation is complete on last date of service:  96137 = 4  units 96131 = 3 units   Information  Given information obtained during the intake interview, additional information was gathered from patient regarding the symptoms of ASD, AD/HD, anxiety, and mood. This is a portion of a more comprehensive evaluation and should not be interpreted in isolation.. Please see the completed diagnostic evaluation for more information.     Semi-structured AD/HD Interview - Self-Report  A1. The symptoms of inattention include: often fails to give close attention to detail or makes careless mistakes; often has difficulty sustaining attention; often does not seem to listen when spoken to; often does not follow through or fails to finish tasks; often has difficulty organizing tasks and activities; often avoids/dislikes tasks that require sustained mental effort; often loses things necessary for tasks; is often distracted by extraneous stimuli; and/or is often forgetful in daily activities.   Do you: Fail to give attention to detail or makes careless mistakes: careless mistakes, try to be careful and don't miss anything but make them   Have difficulty sustaining attention: Yes  - right now is thinking wish had something to do with hands, cannot just watch TV have to also be doing something else  - at work will start one task or get an email or interruption or look up information and hard time getting back to information or will get districted by other information  - need to write down what she needs to do so that she does not forget anything  - need to  force self to get back to task - uses self-talk to help her refocus (I was working on X and need to do that so can get back to this other thing later)  Have trouble listening when spoken to (mind is elsewhere): Yes  - hard to focus sometimes, will zone out  - thinking of something else and someone starts talking take a while to cue into what they are saying - husband says she does not listen   Does not follow through with  instructions and does not complete tasks: depends - new task or know there is a deadline will make sure they get done  - does not seem important to me hard time to get it done    Have difficulty organizing tasks: sometimes - at first can be overwhelming - when in the classroom when it got to messy would pull everything out of the closet and try to put it back in a way that she thought was organized - closet at home seems overwhelming - don't know how would like it to be organized    Avoid tasks that require mental effort: will put things off - try to do the easier task first  - something difficult will put it off    Often lose or misplace belongings: yes, less now because have started creating places for things so that it does not happen as much but other night plugged phone in somewhere else and had no idea where she plugged it in and could not find it   Become distracted easily distracted (including being distracted by thoughts for adolescents/adults): Yes   - even now have sticky notes in front of her and have things that she knows that she needs to get done today so is getting distracted by that - is thinking about to do list and gets distracted   Often seem forgetful: Yes - when tasks need to do will sometimes just forget to do things - at work will find self double checking - have sometimes forgotten scheduled meeting and tried to schedule them twice  - people say that she forgets things that they have told her   - husband has said that they talked about stuff before, talked about it already - will sometimes say going to do something and then forgets about it - will forget that they did something with someone - person will say we saw that movie together and Jamie Dudley did not remember that    Age of Onset:  Age of individual when symptoms were first noticed: not sure - at least in elementary school - remember would forget to show mom things and forgetting to take homework home - would forget book  bag sometimes   Over time (better/worse/the same): gotten worse since had son - a little bit of brain fog - not sure if more distractible or noticing it more   Any of the above symptoms not present in childhood: No - remember mom reminding her to do things like put shoes away and could not find the other shoe   Any of the above symptoms that were present in childhood but are not now: through having jobs that have required her to get organized has made her better at that - 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" - would bring home piles of paperwork, would not get the students work graded - never found a good way to keep up with the paperwork - would let the kids get her off track - would  be teaching and kid would raise hand about something and they would get off track and she struggled to get back on track    - having things electronically has helped - can look things up rather than find the papers - knowing that she needs the lists and sticky notes in front of her has helped her to remember things - sets reminders on her calendar - learning the systems that will help - spreadsheet that she tweaks     Impact on functioning, settings that behaviors are noticed  School/work: when in school affective grades - even in final years of college would not turn in completed homework - time management - knew had a paper due later in the week but may start working on it the night before - would take the bus to school and would start working on it on the bus on the way to school   -  at work, second guess self a lot - did I remember to do this, or I forgot something and hope consequences were not too much - have forgotten to put meetings on the calendar for example, so missed meetings -   Home: Yes - there a few areas - if can get kitchen straight feeling okay - desk is not very organized, know need to organize, weighs on her mentally - same thing with the bedroom - bedroom is very messy - weighs her down  mentally - know need to do something and have not done it     Peers: Yes  - friend said felt taken advantage of and said that she had told Jamie Dudley about it but Jamie Dudley did not remember that conversation - not sure if she was distracted or it was told in a round about way and did not get it - husband has said that they talked about stuff before, talked about it already - will sometimes say going to do something and then forgets about it - will forget that they did something with someone - person will say we saw that movie together and Jamie Dudley did not remember that     A2. The symptoms of hyperactivity-impulsivity include: often fidgets, taps hands or feet, or squirms in seat; often leaves seat when remaining seated is expected; often runs/climbs in situations where it is inappropriate; often unable to play or engage in in leisure activity quietly; is often on the go; often talks excessively; often blurts out answers before the question is completed; often has difficulty waiting for his/her turn; often interrupts or intrudes on others.  Do you: Fidget: Yes   Leaves seat unexpectedly: No - usually with long monthly staff meeting might stand up and move a little but but it is virtual - during in person staff meetings will stay seated - may focus on the materials that she is given rather than what they are saying at the meeting   Feel restless: Yes     Have difficulty engaging in quiet activities: want music or noise in background and will talk to self during quiet activities   Seem to be often on the go : No - hard for her to get up in the morning unless have something scheduled - like being up before son to have quiet time but can be hard to get up too   Talk excessively: No - depends on situation and mood - generally pretty quiet and do not like talking about self all that much - will only give people what they asked  and not elaborate - other times might talk a little bit more but will go off on tangents    Blurt out answers: No - maybe when younger   Have difficulty waiting turn: No   Interrupt often: No   Age of Onset:  Age of individual when symptoms were first noticed: did not really notice it until she was older - someone at work noticed that Jamie Dudley always moved her leg when standing but had not really noticed that about herself   Over time (better/worse/the same): hard to say, wasn't keeping track of it, maybe worse   Any of the above symptoms not present in childhood: hard to say, did not really pay that much attention to - wasn't that she recognized that she needed something in the background to get something done - would rather not do the homework - teacher put a soundtrack on when taking a test in high school and noticed that it helped her to focus, so that is when she started putting music on when doing things    Any of the above symptoms that were present in childhood but are not now: No - teacher always said she was quiet in school  Impact on functioning, settings that behaviors are noticed  School/work: No - before was on feet all day so had to be active - never had trouble moving around the classroom - now in office but still flexible enough that she can get up and move around if needed  Home: No  Peers: No - when dating she and husband would talk on line but always had other chats open and so was not totally focused on her conversation with him   - have days when can focus on one thing or times when get lost in something, especially if procrastinating - will find something else to do when am supposed to be working on something else     Semi-Structured Self-Report Interview Based on ADI-R and SCQ:  Social communication and social interaction skills  A1: Deficits in social-emotional reciprocity, ranging, for example, from abnormal social approach and failure of normal back-and-forth conversation; to reduced sharing of interests, emotions, or affect; to failure to initiate or  respond to social interactions.  Social challenges across the lifespan: never really felt like fit in - had friends in elementary school, went to sleep overs and birthday parties -never felt like part of a group - disconnect with friend of 20 years - is he really my friend, are we only hanging out because we are hanging out with a mutual friends   Challenges with conversations: hard time following the flow - trouble jumping in to respond to things - if there is a group conversation don't know when to jump in - cannot think of anything or there is a lull in the conversation but they have already moved on - can't think of what to say before they move on - will sometimes add something to a conversation an hour later  - trains of thought are a squiggly instead of a straight line - says something that sounds completely unrelated to others but seems totally related to her  - Talk too much: No - Challenges with small talk: Yes - a lot of times it is not interesting - know it is important but don't really want to talk about these topics - if someone asks about something interested in or have a strong opinion about can talk more about it but might end up taking  over the conversation more   - Starting and ending: default tends to be 'how is everything going today' - don't tend to bring up things from past conversations as often - have trouble figuring out what want to say in the way she wants to say it  Challenges with social conventions and niceties: one reason have trouble with friendships - don't know if people are being nice to her and don't really want to hang out or if they are genuinely wanting to start a friendship - hold back because do not want to offend anyone but has not really been told that she is rude - in high school said things that she thought were funny but ended up offending person talking to  - asked to banquet by someone that she liked and was not sure if he was genuinely asking or "put  up to it" so asked "are you serious" and he never answered, thinks he may have taken it as a rejection   Challenges with social judgement: even after she and husband have been on dates and she liked him she was surprised when he kissed her - surprised that it was reciprocated   Challenges with understanding subtle social cues: miss hints in conversations   Challenges with understanding humor, being too literal: can understand sarcasm okay - too literal sometimes   Childhood: think am a little bit better now, compensate by smiling and being friendly and saying hi to people - but still accidentally hurt someone's feelings in a meeting once and tried to word it in a different way so would not but did   A2: Deficits in nonverbal communicative behaviors used for social interaction, ranging, for example, from poorly integrated verbal and nonverbal communication; to abnormalities in eye contact and body language or deficits in understanding and use of gestures; to a total lack of facial expressions and nonverbal communication.  Challenges with eye contact across lifespan: little bit better now   Do people seem to be able to tell how you are feeling from your facial expressions alone: have a neutral expression most of the time  Do you have difficulty recognizing and responding to other's expressions: I did not think so - if someone is smiling will smile along with them - can tell when husband is upset - can recognize okay   Any challenges understanding or responding to nonverbal communication:  don't tend to notice things like that   A3: Deficits in developing, maintaining, and understanding relationships, ranging, for example, from difficulties adjusting behavior to suit various social contexts; to difficulties in sharing imaginative play or in making friends; to absence of interest in peers.  Were you interested in peers as a young child: Yes - would invite people to birthday party - wanted to be  friends with girls - hair bows were popular so could get a bow - at one point thought it was cool to not like school so would act like she was bothered by school stuff even when she was not   Growing up did you have get-togethers with peers: Yes - was invited to a few sleep overs, not invited to that many birthday parties  - would hang out with neighbors and would still have sleep overs with them even after they moved away   Growing up did you have preferred peer/best friend: Yes   - elementary was okay  - middle school everything changed - best friend started hanging out with other people - took band because  she was and she would still talk to Jamie Dudley but she had another group of friends that she hung out with and Jamie Dudley was not invited to stuff with her - there was a few people that she talked to but never really hung out  - in high school had someone that she talked to but never really hung out with but did start hanging out together in college - girl in high school and they talked all the time and she came over to house a few times but still did not think of her as a friend - if asked Jamie Dudley at that time if she had friends would have said no - in high school have 3 step-sisters would go and visit dad every other Christmas and summer -  - once best friend in middle school stopped talking and step sister accused dad of something - a lot of fighting going on - talked about Jamie Dudley and said that she was 2-faced and acted one way in front of parents and another way in front of her - gave her disconnect - felt that when people said they were friends that they were not really friends - closed self off  - in 9th grade lived with dad for a little but - went to school with them for half a school year  - had a person that hung out with in youth group during middle school and said that after she moved back in 9th grade said that she was quieter and then the peer introduced Jamie Dudley to her "best friend" and  Jamie Dudley felt that  meant that they were no longer best friend so was more closed off and did not trust people as much anymore  - spent time with friends in high school but still felt very lonely, people that would talk to her and sit with them at lunch - still felt like the outsider   Do you understanding how to adjust behavior to fit different situations: depends - if feel comfortable will be more open and if don't will not be as open with them   Have you been happy with your ability to have relationships across your lifespan (including friendships and romantic relationships): if I could go back and tell self something in high school would say get out of own head and tell herself that she did have friends and could relax a bit - did not like 8th grade at all and thought living with dad would be an escape but now wish now maybe did not go  - wanted to talk to people more but felt like did not know how - other people had a manuel about how to do it and she missed it    Restricted, repetitive patterns of behavior, interests, or activities B1: Stereotyped or repetitive motor movements, use of objects, or speech (e.g., simple motor stereotypies, lining up toys or flipping objects, echolalia, idiosyncratic phrases).    Have you ever shown: Repetitive play (lining up, sorting toys, organizing toys): No Very focused on toys that spin, twirl, drop, etc.: No Something you carried with them all the time: No Interest in the parts of toys: would take Barbie apart - sister led play and would follow along with what she did  Repetitive body movements (finger mannerisms, hand flapping, toe walking, spinning): No - might go up on toes when thinking about something - was in ballet through elementary school   Repetitive or stereotyped speech (e.g., starts conversations the same way, repeats  others/media, used 'you' instead of "I"): if don't have something to say there is a line from a TV show or movie that fits - friends and husband  are nerds too so does not seem out of the ordinary - seems in line with people talking to  - related to songs and movies so easy to go that   - skin picking, bite on lip    B2: Insistence on sameness, inflexible adherence to routines, or ritualized patterns of verbal or nonverbal behavior (e.g., extreme distress at small changes, difficulties with transitions, rigid thinking patterns, greeting rituals, need to take same route or eat same food every day).    Have you ever shown: Difficulty with transitions: sometimes hard to get started and move from one thing to another on own  - when get to work might sit in car for 5-10 minutes before walking into work   Hard time stopping if interrupted or activity not complete: get annoyed if working on something and then interrupted - at home is always focused on doing something so everything feels like and interruption - even if want to talk to husband in that moment will feel like was watching a YouTube video and it was not done  - hard to make the transition to the new thing and then hard to get back to the original task    Difficulty with changes: Yes - if there is a new process at work or husband changes furniture hard time adjusting to it  - husband has had a new car for a long time but sometimes still references the old thing thinks of the old thing   Rigidity/routines/things he/she insists on: need to physically have coffee in front of her even if she does not complete it - like being able to do word game in the morning  - [employer], [employer] is doing a strike - have not been doing it to support strike but feels like something is missing even though playing other word games - cannot get self together in the morning throws off day  Cognitive Inflexibility: have felt it - hard to rearrange things - had a classroom set up would not rearrange it during the year - told had to rearranging it could have a hard time thinking about how to do it a different way     B3: Highly restricted, fixated interests that are abnormal in intensity or focus (e.g., strong attachment to or preoccupation with unusual objects, excessively circumscribed or perseverative interests).  Have you ever shown: Intense interests: used to really like pink and dressed in pink in head to toe and came up with a rhyme about being pink - will do deep dives about something, like an octopus- have been interested in them for a while  - was into dolphins in high school, everything wore was dolphin  - sometimes is interested in something and it is a quick burst of knowledge and sometime last long   Unusual interests: always notice colors, when enter a building will noticed when colors match   B4: Hyper- or hyporeactivity to sensory input or unusual interest in sensory aspects of the environment (e.g., apparent indifference to pain/temperature, adverse response to specific sounds or textures, excessive smelling or touching of objects, visual fascination with lights or movement).  Have you ever shown: Visual interests: No Visual Aversions: No - prefer dim lights   Tactile Interests: like cotton clothing, like rough textures, would stick safety pin through first layer of skin -  liked putting glue on hands hand peeling it off   Tactile Aversions: don't like polyester because it has an oily feel that sticks around, don't like sand all that much but will walk on beach - don't like slimy and gritty   Smelling/mouthing of unusual objects: used to always put pen in like and suck on it until one day ended up with a mouth full of ink - chewed on pencils  - sensitive to smells - have a lavender that helped with birth  - can always tell when someone is wearing cologne   Auditory interests: No Auditory Aversions: don't like sudden loud noises - did not like the sound that the pin ball machine made during the prior eval visit   Food aversions: foods - don't like grittily foods like  quinoa Unusual responses to pain: No Unusual responses to temperature: No   - when started picking skin in high school would not know where scratches came from - did not know that she was the one doing it - tried to stop a few times, lasts a half an hour and then back to doing it - keeping hands busier is easier to not do it - started doing it intensely in high school, would spend and hour in mirror looking for flaws in face, sit on couch and pick shoulders - before that would bite nails to point of bleeding  - would peal at dry hands until they bleed - currently - skin picking is better - more conscious of how things look and can stop before point of bleeding - more aware of what doing to self but still happening   Anxiety  GAD: Excessive worry: Yes  - worry about something everyday   More than 6 months: Yes   Worry is hard to control:have to solve it or distract self  - will give self-talk - like baking for other people and wanted to make an apple cake and worried that would not be able to have to time, and then had to tell self it was okay and no one was expecting anything so okay not do it but still feel disappointed that set goal for self and did not get it done  - most days can distract self - few times when not stopping until solve it or do something about it - when Jamie Dudley very stressful job - could not relax in the morning  - something when something happened and told her to come in and she could not eat after that (Even though in the middle of breakfast) and nothing calmed her down until she was headed to work   Physical symptoms - restlessness or on edge: Yes  - easily fatigued: feel tired in general - not more when worried  - can't concentrate: Yes - hard to think of other things when worried about something  - mind goes blank: in general yes, but not worse because of worry  - irritability: No  - muscle tension: Yes - shoulders and neck more tense - feel it in  stomach too - could not enjoy bachelorette party because so worried about wedding next day and could not eat anything next day either - got sick and went back to hotel early because of nerves and could not eat next day either  - new social situation or sleep is interrupted feel sick   - sleep disturbance: Yes - really worried about something will have trouble falling asleep   Social Anxiety:  Persistent,  intense fear or anxiety about specific social situations because due to fear of being judged negatively, embarrassed or humiliated: Yes - worried about saying the wrong thing, having something do or say being taken negatively - not being able to come up with right thing to say  - bothered by meeting new people, have not gone to many parties but would not like big parties where there were many people   Avoidance of anxiety-producing social situations or enduring them with intense fear or anxiety: avoid things - with husband will feel like he is upset or she cannot talk to him or ask him things, will wait until he is a better mood - more that she does not want to bother him  - at work sometimes will say they will not want to talk to me right now I will leave them alone, don't want to bother them right now - want to talk to them but don't have anything to say so will walk on by   Excessive anxiety that's out of proportion to the situation: Yes - if I can get into a conversation with someone will realize after the fact that it was fine and they "tolerated me alright"   Anxiety or distress that interferes with your daily living: Yes - impacts friendships that could have had, has kept her from making friends at work - want to talk to people and therapist suggested asking colleagues to do things but not as easy for her - feel like people that have been there less time have been more able to form work friendships  - 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 and was always the last one to find a partner  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 - she would wait until she said to go and find partner but other kids were already looking at people to partner up with while she was talking but Jamie Dudley did not want to be rude   Mood Symptoms of Depression  Sadness, crying, down mood: Yes - few days when felt down - tends to happen a few days before cycle  - no periods of down moods lasting at least 2 weeks   Feelings of hopelessness, worthlessness, and guilt: always need to prove worth to someone   Loss of energy: No Loss of interest or pleasure in everyday activities: No Irritability: No Need for more sleep or sleeplessness: feel fatigued - if lay down in day tends to fall asleep - get adequate nighttime sleep in general but does not seem to matter how long she sleeps  Change in appetite: Yes - for past month, but lately have felt not as hungry, felt full and more blotted  Weight loss/gain: have gained some weight - used to never gain weight and 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

## 2023-01-30 ENCOUNTER — Other Ambulatory Visit: Payer: Self-pay | Admitting: Certified Nurse Midwife

## 2023-01-30 DIAGNOSIS — Z1231 Encounter for screening mammogram for malignant neoplasm of breast: Secondary | ICD-10-CM

## 2023-02-14 ENCOUNTER — Telehealth: Payer: Self-pay | Admitting: Clinical

## 2023-02-14 NOTE — Telephone Encounter (Signed)
Called Jamie Dudley to follow up on the below email sent last week. She reported that the she had received the email and would work to the paperwork back today. She did not have other questions.   [name], PhD  Sent 02/08/2023  Hello  I hope that you are doing well. I just wanted to check in with you because I am still missing a few things. First, it looks like there is still one electronic questionnaire that I think your spouse was going to complete that has not been completed yet. Also, I think that we talked about having you fill out the attached questionnaire about yourself. If you would not mind completing this and sending it back to me, I would appreciate it. Also, if you were open to having your spouse fill this out about you for the last 6 months it could be very helpful.   Please let me know if you have any questions or concerns and I hope that you have a happy Thanksgiving,    [name], PhD [facility] Psych[facility] [phone]   Confidentiality Note: This e-mail is intended only for the person to whom it is addressed and may contain information that is privileged, confidential, or otherwise protected from disclosure. Dissemination, distribution, or copying of this e-mail or the information herein by anyone other than the intended recipient is prohibited. If you have received this e-mail in error, please notify by reply e-mail and destroy the original message and all copies.

## 2023-02-16 ENCOUNTER — Ambulatory Visit: Payer: BC Managed Care – PPO | Admitting: Clinical

## 2023-02-16 DIAGNOSIS — F4010 Social phobia, unspecified: Secondary | ICD-10-CM

## 2023-02-16 DIAGNOSIS — F908 Attention-deficit hyperactivity disorder, other type: Secondary | ICD-10-CM

## 2023-02-16 DIAGNOSIS — F331 Major depressive disorder, recurrent, moderate: Secondary | ICD-10-CM

## 2023-02-16 NOTE — Progress Notes (Signed)
Testing Visit Documentation    Name: Jamie Dudley        MRN: [id number]  Date of Birth: 1981-02-05       Age: 42 y.o.  Date of Visit 02/16/23    Type of Service Provided Psychological Testing (Feedback session) Type of Contact: virtual (via Caregility with real time audio and visual interaction)  Patient Location: at work Provider Location: office   Visit Information: Session was conducted via telehealth and [name] verbally consented to telehealth. Patient consented to a telehealth session and is aware of and consented to the limitations of telehealth.   Gladiola presented for the results of the evaluation. No new concerns were reported since last visit. Initial results of the assessment were reviewed and interpreted for [name] including information that supported a diagnosis of Other specified AD/HD, GAD (in partial remission), Social Anxiety Disorder, and Skin Picking. ASD was discussed with examiner explaining that at this time there was not sufficient information to provided a diagnosis (e.g., SRS-2 form her Dudley was not returned, early info was not suggestive of significant social communication concerns). The possibility of deferring this diagnosis for now was discussed. However, [name] reported that she would like to talk to her husband to see if he would complete the questionnaire and provide some additional information. Examiner reported that she would also likely need to complete a brief phone interview with him given the current information and additional needed information. Explained that even with this information we may still end up with ASD being unclear, but that with additional information it was more likely that a definitive answer could be provided and [name] wanted to proceed. Therefore, request for a release to allow communication was sent to the front desk to be sent to Jamie Dudley, the SRS-2 was resent, and the examiner will plan to call Jamie Dudley next week for more information, provided the release is  returned (examiner highlighted to [name] that she cannot call without it). A second feedback session was scheduled for December 18th.   Once that session is completed a full written report will be completed and shared with Cecylia. Please see the completed report for more detailed information regarding background information, testing results and interpretation.   Plan: See note above - additional information will be gathered from Jamie Dudley's husband and a second feedback session was scheduled.   Time Spent as part of the current visit: Additional integration/Report Generation: 02/06/2023, 1:15 pm - 2:45 pm, 02/15/2023, 4:25 pm - 5:30 pm (155 minutes)  Time spent in Initial Interactive Feedback Session: 02/16/2023, 9:00 am - 9:57 am (57 minutes)  To be billed once evaluation is complete on last date of service:  96131 = 3 units    [name], PhD

## 2023-02-21 ENCOUNTER — Other Ambulatory Visit: Payer: BC Managed Care – PPO | Admitting: Clinical

## 2023-03-01 ENCOUNTER — Ambulatory Visit: Payer: BC Managed Care – PPO | Admitting: Clinical

## 2023-03-01 DIAGNOSIS — F908 Attention-deficit hyperactivity disorder, other type: Secondary | ICD-10-CM | POA: Diagnosis not present

## 2023-03-01 DIAGNOSIS — F4010 Social phobia, unspecified: Secondary | ICD-10-CM | POA: Diagnosis not present

## 2023-03-01 DIAGNOSIS — F331 Major depressive disorder, recurrent, moderate: Secondary | ICD-10-CM | POA: Diagnosis not present

## 2023-03-01 NOTE — Progress Notes (Signed)
Testing Visit Documentation    Name: Jamie Dudley        MRN: [id number]  Date of Birth: January 18, 1981        Age: 42 y.o.  Date of Visit 03/01/23    Type of Service Provided Psychological Testing (Feedback session) Type of Contact: virtual (via Caregility with real time audio and visual interaction)  Patient Location: their work Provider Location: home office   Visit Information: Session was conducted via telehealth and Cameryn verbally consented to telehealth. Patient consented to a telehealth session and is aware of and consented to the limitations of telehealth.   Trinh presented for Part 2 of the results of meeting. During the prior meeting most of the diagnostic information was shared, but there was insufficient information to diagnosis ASD. Nely's spouse had not participated in initial testing and she indicated that she was interested in having him participate. Between visits, Tayvia's spouse complete an autism questionnaire and brief phone interview with the clinician.   No new concerns were reported since last visit. Remaining results of the assessment were reviewed and interpreted for Agnes. Her spouse reported few behaviors consistent with ASD and therefore a diagnosis was not provided. The other diagnoses discussed during the last meeting were continued. Recommendations and next steps were discussed. A full written report will be completed and shared with Amire. Please see the completed report for more detailed information regarding background information, testing results and interpretation.   Plan: Evaluation complete - appropriate referrals and recommendations for next steps made.    Time Spent as part of the current visit:  Brief phone interview with spouse: 02/20/2023, 2:35 pm - 2:49 pm (14 minutes)  Additional integration/Report Generation: 02/27/2023, 9:05 am - 10:45 am; (100 minutes)  Time spent in Interactive Feedback Session Part 2: 03/01/2023, 2:27 pm - 2:57 pm (30 minutes)  Please see the  notes from dates of services 03/11/2023, 01/24/2023, and 02/16/2023 for additional documentation of times spent and units that are to be billed  Total billing (including from the current session and prior dates of service listed above) is as follows:  Total spent in Test Administration and Scoring across all testing visits: 406 minutes  Units billed: 96136 = 1 unit  96137 = 11 units  (Of these 4 units were virtual, 8 were in person)  Total time spend in Testing Evaluation Services including, but not limited to, the integrative feedback session and integration/report generation: 534 minutes  Units billed: 96130 = 1 unit 96131 = 7 units     [name], PhD

## 2023-03-02 ENCOUNTER — Telehealth: Payer: Self-pay | Admitting: Family Medicine

## 2023-03-02 NOTE — Telephone Encounter (Signed)
Copied from CRM #[id number]. Topic: General - Other >> Mar 02, 2023  8:32 AM Phill Myron wrote: Reason for CRM:  Pt. Jamie Dudley received information on Dr Yetta Barre retirement. She is interested in having the new incoming Doctor, Dr. Quincy Simmonds as her new provider.  Pt. Jamie Dudley would like to also ask if Dr Elaina Pattee would be able to help her with: ADHD and Anxiety issues. Please advise

## 2023-03-20 ENCOUNTER — Ambulatory Visit
Admission: RE | Admit: 2023-03-20 | Discharge: 2023-03-20 | Disposition: A | Discharge status: Home / Self Care | Payer: 59 | Source: Ambulatory Visit | Attending: Certified Nurse Midwife | Admitting: Certified Nurse Midwife

## 2023-03-20 DIAGNOSIS — Z1231 Encounter for screening mammogram for malignant neoplasm of breast: Secondary | ICD-10-CM | POA: Diagnosis present

## 2023-04-01 NOTE — Progress Notes (Signed)
Addendum to 2nd results meeting note:   Post Service Work and report completion occurred on 03/26/2023, 2:30 pm - 3:30 pm & 5:00 pm - 6:45 pm, 03/28/2022, 10:00 am - 11:35 am, 03/30/2023, 9:10 am - 9:50 am & 10:15 am - 11:20 am, 03/31/2023, 9:50 am - 12:15 pm & 2:00 - 3:00 pm (570 minutes)  Report sent to front to be shared with Yi on 04/01/2023.   [name], PhD

## 2023-04-01 NOTE — Progress Notes (Signed)
____________________________________________________________________________________   CONFIDENTIAL PSYCHOLOGICAL ASSESSMENT1The assessment results are confidential.  This report is not to be copied in whole, or in part, nor discussed without the consent of the parent/guardian or the individual (if 18 years or older).    Name: Jamie Dudley       MRN: [id number]  Date of Birth: 1980-04-13       Age at Assessment: 43 years  Dates of Evaluation: 12/29/2022, 01/09/2023, 01/24/2023, 02/16/2023, 03/01/2023  Date of Report:  03/26/2023 Psychologist:  [name], PhD     Psychology License # [id number], Health Services Provider Certification: HSP-P   Reason for Evaluation Jazsmin was seen for an evaluation at [facility] due to concerns about her attention and organizational skills, social interaction skills, anxiety, and mood. Specifically, Lorianne has wondered if she may meet criteria a something like ASD or AD/HD and was therefore seeking an evaluation for diagnostic clarification.   Relevant Background Information The following background information was obtained from interviews completed with Gloriann, information gathered through a Developmental History Form completed by Mary-Ann and her mother, written information from Gwendolynn's sister, and some written information and brief phone interview with Sharlene's husband. The accuracy of the background information is contingent upon the reliability of the responses provided as well as the validity of the information contained in previous records.  Pregnancy and Birth Information Medication during pregnancy: No        Exposure to substances or potentially harmful events in utero: No Complications during pregnancy/delivery: Pregnancy was complicated by high blood pressure.  Length of pregnancy: full term   Delivery method: vaginal    Birth Weight: 6 lbs., 5 oz.      Complications post-delivery:   No           Developmental Milestones History of  developmental/behavioral concerns/differences: Gordon reported that in childhood, she had difficulty sitting as expected. In addition, although she had some friends in elementary school, she regularly felt left out of the conversation and there were some differences about some of her peer relationships (e.g., in the 5th grade she sat with a peer at lunch but did not know their name). She would also bite her nails and pick her skin. According to paperwork completed by Mahaila's mother, there were some concerns about Toneshia's behavior and attention in childhood.   Current/Past Speech/Language concerns: There may have been some concerns about Jenika's articulation in elementary school.  Age of first words: around 43 months  Age of first 2-3-word phrases: WNL  Age of walking without assistance: 12 months  Any loss of previous attained skills: No             Medical History: Medical or psychiatric concerns or diagnoses: depression and anxiety        Significant accidents, hospitalizations, surgeries, or infections: No Allergies: No Currently taking any medication: No      Current/past eating/feeding concerns: Naziya does not like unexpected textures in foods (e.g., she would be bothered if she was eating a brownie and bit into a nut).                                                                        Current/past sleeping concerns: Etsuko reported that it is  almost "too easy" for her to fall asleep. For example, when tired Charnelle sometimes will fall asleep during another activity (e.g., she has fallen asleep during a conversation or when on the computer). Despite generally getting adequate nighttime sleep, Mirielle tends to fall asleep if she lays down during the day. She is usually fatigued no matter how much sleep she has gotten.                                                                            Hygiene concerns/changes: No                                                                                 Psychiatric  History Current/past exposure to traumas and/or significant stressors (e.g., abuse, witness to violence, fires, significant car accidents): Zara reported exposure to significant stressors in childhood, including increased family conflict before her parents separated when Tongela was around 43 years old.            Current/past aggressive behavior: Although Haydan described her behavior as "pretty even", fluctuations in hormones can impact her level of anger/irritation. For example, she was quicker to become angry when she was pregnant and has noticed similar challenges at certain points during her monthly cycle. At these times, Tyshana feels less in control of her emotions.                                                                                 Current/past significant behavioral concerns (e.g., stealing, fire setting): No Current/past hearing/seeing things not there or expressing unusual beliefs/ideas: No                                                                             Current/past mood concerns (depressed or unusually elevated moods): Mayla began experiencing symptoms of depression in high school and was diagnosed with depression a few years ago. Hildreth described her current level of depression as "okay" most of the time, though she also stated that she had difficulty identifying the last time she was "actually happy" and reported that her typical current mood is "tired" as she often feels fatigued. 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 As  described above, she experiences mood fluctuations that seem to correspond to monthly hormonal fluctuations. Nevertheless, Nelline usually appears calm, though she described this as "not a peaceful calm" and more of a "flat calm". Recently, there have been times when Glennys felt down for a few days in a row or had days when she struggled to do anything, but has not  experienced recent down moods lasting at least 2 weeks. Jaydeen also occasionally experiences days when she feels restless and wants to get out and do something, though she does not "know what that something is". These more elevated/restless moods tend to be brief, lasting for a few hours.                                             Current/past anxiety concerns (separation, social, general): Srija reported that she has experienced anxiety since middle school. Current worries include fears of failing and whether she is doing "the right thing in the right way." She also worries that someone is going to become angry with her or think that she is weird, as well as worrying that she is going to drive or push people away. She usually experiences anxiety/worry every day and her anxiety can be difficult to control at times (e.g., there are times when the only way for her to stop the anxiety is to either fix the issue causing anxiety or distract herself), though there are times when she has been able to use self-talk to manage anxiety. For example, she recently used self-talk to manage anxiety and disappointment about not achieving a goal she set for herself (i.e., she planned to make an apple cake during a weekend but ran out of time and then had to use self-talk to feel okay about not baking the cake even though no one was expecting her to bake anything). When anxious, Atlas will feel restless or on edge and may have difficulty concentrating (because she has a hard time thinking about anything other than what she is worried about). She also may experience muscle tenseness or other physical pains (e.g., stomach pains), and when she is very worried, Santia may have difficulty falling asleep. For example, Ulla reported that she made herself sick and could not enjoy her bachelorette party because she was so worried about her wedding the next day, and on her wedding day she was unable to eat because of anxiety.  In addition to general  anxiety, Beatrice has specific anxiety related to social situations, though she noted that these worries are not entirely "unfounded" because she has had some social challenges. For example, as a child Suzette tended to be the last one to pair up during in-class partner activities, though Larena noted that her tendency to not want to appear rude by trying to look at peers and pair off before the teacher formally asked the class to go find a partner may have contributed to this. Currently, Yaniris is bothered by meeting new people (e.g., she would not like going to a large party where there were many people). She also worries about her behavior during social interactions, such as fears of saying the wrong thing, being unable to come up with the 'correct' thing to say, and/or that something she says will be taken negatively. She also sometimes worries about a conversation before she has it, only to realize after the fact that the  conversation was fine and the other person "tolerated me all right." Renate will also sometimes be bothered thinking about something that she said to someone "20 years ago". Floyce tends to avoid social situations that cause her anxiety, such as sometimes avoiding having particular conversations with her husband.  At work, despite wanting to talk to her coworkers, she may avoid starting an interaction due to fears that her coworkers do not want to talk to her, or that she will be bothering them by trying to talk with them. As such, even when she could start a conversation with a coworker Stina may walk by without taking the opportunity to try to talk with them. These social fears have reduced Tauriel's opportunities to make friends. For example, some of her coworkers have been able to form work friendships in a way that Mega has not, despite Buffie having worked with her organization longer than these individuals. Her therapist suggested trying to ask her coworkers if they would be interested in getting together after work,  but Desire feels that this would be challenging for her to do.   Current/past obsessions (bothersome recurrent and persistent thoughts) or compulsions: In childhood, Laylah would bite her nails to the point of bleeding and would peel her dry hands until they bled. In high school she began skin picking. When this behavior first started, Kaeya did not realize that she was the one that was causing the scratches on her skin. The intensity of this behavior increased over the course of high school. Eventually Yesenia would sit on the couch picking at her shoulders, for example, or would spend an hour looking in the mirror for flaws on her face that she could pick. Despite the desire to leave her skin alone and stop this behavior, Kayleeann has struggled to control this behavior. For example, she has tried to stop skin picking several times, but usually restarts within a 1/2-hour. She has an easier time refraining from picking when her hands are busy. Currently, Samaiyah is more conscious of this behavior than she was in the past and therefore, can usually stop before getting to the point of bleeding.   Concerns regarding attention/focus/impulsivity: Mahdiya described several challenges with focus and attention. For example, there are times when Bannie can really "dig into something", especially when it is new (e.g., she may get very involved in a work task and spend 2 hours on it without noticing the time passing), while at other times she may find a work task boring and has difficulty starting (e.g., she ends up spending too much time trying to find something to listen to in the background rather than starting the task). 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 Katrinia can also be impulsive. For example, she may decide to do something that will impact her family and then execute it without running it by her husband first.    Anglia was not sure when symptoms  of inattention first began but recalls having at least some challenges in this area during elementary school, including a tendency to be forgetful (e.g., there were times when she forgot her bookbag, to show her mother necessary paperwork, and/or to take her homework home to complete it). During the ADOS-2, Sammie reported that she sometimes got into trouble in childhood for not turning in her homework. She also recalled misplacing her belongings in childhood (e.g., her mother would ask her to put her shoes away, but she  could only find one shoe). Mohogany also struggled with time management and turning in completed assignments. For instance, she may not have started working on a paper until the night before it was due or would start working on an assignment that was due that day on the bus ride to school. Jai reported that inattentive symptoms impacted her grades when she was in school. These challenges also continued into adulthood. 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 In class, responding to a student's question could result in Letesha getting off track and then struggling to get back to the lesson. Over time, Margurete has learned to rely on various systems to help her keep track of things (e.g., having access to electronic documents has decreased the need for her to keep track of papers) and has developed some organizational supports on her own. For example, Teryn has learned that she needs lists and sticky notes in front of her to help her remember tasks she must complete, will set reminders on her calendar, and has a spreadsheet that she uses to keep on track. However, she also noted an uptick in challenges associated with inattentive symptoms since her son was born. In  addition, despite having developed systems that help her at work currently, Tamsen continues to second guess herself due to her past challenges and sometimes continues to have difficulties (e.g., there have been times when she missed meetings because she forgot to put them on her calendar). She can also be hard on herself when she knows that she needs to do something and has not been able to complete it. At home, disorganization of certain spaces can increase her mental burden. In addition, symptoms of inattention have also impacted her relationships. For example, Mardelle sometimes forgets things that friends have told her. Further, her husband has sometimes expressed frustration during a conversation when he feels that they have already talked about an issue but Lynnelle does not seem to remember, and/or when she forgets to do something that she previously said that she would. In addition, Mishel's relationship with one of her close friends is strained. This friend reportedly told Tomasa that she felt "taken advantage of" and that they had already talked about why she felt this way, but Cherylene does not remember the conversation her friend referenced. She was unsure if the conversation took place at a time when she was distracted, or if her friend tried to talk with Amalie in a roundabout way and Samia did not get it.   In terms of hyperactive-impulsive behaviors, in high school Rande had a teacher that played soundtracks during tests and Grady realized that she was more able to focus with this background noise. She then started to listen to music when she was engaged in other activities. Sabella did not notice differences in her activity level until a coworker pointed out that Tamani always moves her leg when she is standing. Because she did not really focus on these types of behaviors previously, Jeydi was unsure if her fidgety or active behaviors have stayed the same or escalated since childhood. The impact of these challenges is also a bit unclear.  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  During a brief phone interview, Jaleiyah's husband reported that currently Idaliz seems to do pretty well with organization at work. At home, they all have some "challenges with neatness" but she does not seem any messier than anyone else in the family. He also noted that Nevaeh is the "finder of things" because she is often able to locate misplaced objects.   Current/past social concerns and/or restricted or repetitive behaviors: Lameeka reported that she began questioning autism spectrum disorder for herself due to her feelings of "otherness", motor planning challenges, social challenges, and difficulties with tasks that have unclear parameters. Laira also has a history of difficulty making and keeping friends. For example, her best friend in middle school abruptly stopped talking to her, in high school Lindyn had people she spent time with but did not categorize them as friends, and in college a friend "ghosted her". More recently, Meral's best friend of 15 years decided that she needed space from Aurilla, and Lucyle still does not understand why this occurred (her friend reportedly said that she felt that Ottilia had taken advantage of her, but Jaylie did not understand why her friend felt this way). Conversations can be challenging for Ghadeer because she does not know what to talk about despite trying to practice "reflective listening." She also dislikes eye contact and will typically look at someone's forehead or mouth rather than looking directly into their eyes. Erielle also engages in some repetitive movements, such as stretching her fingers and loosening and tightening her watch band repeatedly. Clorinda does not have difficulty with transitions or changes that she initiates, but if she is not the one making the change or if she does not know about a change or transitions ahead of time, she struggles. Regarding  sensory differences, Eathel hates polyester fabrics (e.g., she has sometimes "made the mistake" of purchasing clothing that she liked the look of but ended up not wearing the item because she "cannot stand the physical feel" of it). She also dislikes sticky textures and her hands being messy (e.g., she regularly must clean off her hands if she is eating something that leaves residue on her fingers).    Current/past suicidal and/or homicidal ideation: No                Current/past substance use/abuse: No                                                           Current/past legal involvement or issues: No  Past Interventions Current/past services/interventions: mental health therapy  Outpatient Providers: [facility]  History of Psych Hospitalization: No                                              Work, School, and Assessment History          Level of educational attainment: Aliana has a bachelor's degree.    Attended public or private schools in childhood: During childhood Yaritzi attended a combination of private and public schools.               Academic Concerns: Zavannah reported that she did not experience challenges with basic  academic skills, but her attention/organization impacted her academic performance. For example, there were times when Calynn would complete her homework, stuff it into her backpack, and then was unable to locate her completed work the next day. As she aged and went further in school she also tended to procrastinate more, as she would put off work until right before it was due and then would need to rush to get it done. During the ADOS-2 Siani reported that although she completed college, she struggled because she was used to "skating by" and had not developed time management or study skills. As such, there were assignments that she did not complete at all and others that she completed but did not turn in. She also noted that she did somewhat better after college when taking courses for  various certificate programs because she was "more excited" about what she was learning and was therefore more motivated.    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 Stressors in last 6 months: Mellissa described herself as "conflict avoidant" and noted that she feels anxious whenever she and her husband have a disagreement.   Medical/psychiatric concerns in immediate and/or extended family history: bipolar disorder, suspicions of ASD or other neurodevelopmental differences        Consultations necessary/requested: As part of the evaluation, Sheral's sister and friend completed some written questionnaires and her mother provided some basic background information on a questionnaire. After an initial results discussion Rayan's spouse also completed a questionnaire and participated in a brief phone interview.    Recreation/Hobbies: Mardella likes to crochet (she started this activity to keep her hands busy). She also likes reading and playing video games, and used to play the piano.    Strengths: Self-identified strengths include that Endora is organized at work. She is generally friend to people, smiles, tries to talk to people, and does not treat anyone poorly.   Assessment Procedures:  Interviews with Nevea Information Provided by Jacquelynne's Family and  Friends Wechsler Adult Intelligence Scale-Fourth Edition Autism Diagnostic Observation Schedule - Second Edition (ADOS-2), Module 4 Social Responsiveness Scale - 2 The Camouflaging Autistic Traits Questionnaire (CAT-Q) CNS Vital Signs  Adult ADHD Self-Report Scale (ASRS - v1.1) Symptom Checklist Generalized Anxiety Disorder score (GAD-7) Patient Health Questionnaire (PHQ-9) The DSM-5-TR Level 1 Cross-Cutting Symptom Measure DSM-5 Depression Adult Measure DSM-5 Anger Adult DSM-5 Anxiety Adult Measure  DSM-5 Repetitive Thought and Behaviors Scale Severity Measure for Social Anxiety Disorder (Social Phobia)--Adult Severity Measure for Generalized Anxiety Disorder--Adult Conners' Adult ADHD Rating Scale (CAARS) -Short Form The Behavior Rating Inventory of Executive Function-Adult Version (BRIEF-A) Symptom Checklist  Behavioral Observations:   During the in-person evaluation session, although Tammee's overall effort and engagement appeared appropriate, she also appeared nervous, especially at the outset of testing. For example, during the initial cognitive subtest, which asks individuals to recreate designs with blocks, Manhattan showed a slight hand tremor. As task difficulty increased, Lacole began using various problem-solving strategies, such as beginning her design by trying to correctly orient each of the 4 corner pieces and then working individually on each column or row. However, at times these strategies seemed to slow down her performance. Dajai was also observed to either whisper to herself or move her lips as if she were talking during several tasks. For example, during a working memory task that asked Wrigley to remember and manipulate sequences of numbers, Shantina often first whispered the provided sequence to herself a few times before attempting to manipulate the numbers as instructed. She also sometimes moved her hands/fingers in what seemed to be an effort to enhance her memory, and sometimes appeared to  be chunking the numbers together. During a different working memory task that asked her to remember and solve arithmetic problems, Kiva sometimes asked for items to be repeated. During a nonverbal task that asked her to find the missing piece of a pattern, Deriyah again seemed to whisper to herself and use her hand/fingers to support her problem-solving efforts. Mahkayla fidgeted and bounced her leg throughout much of the cognitive testing. During a computerized neurocognitive assessment, Jakia continued to regularly shake or bounce her leg/foot. She occasionally vocalized in a way that suggested that she was aware that she had made an error but had been unable to prevent it from occurring (e.g., during one of the practice subtests she stated, "oh shoot" and during a different subtest, she said "I did not mean to press that one"). She participated appropriately in the other assessment activities. Overall, it is believed that the below results are a generally valid estimate of Metta's current functioning.    Assessment Results and Interpretation: Wechsler Adult Intelligence Scale-Fourth Edition Jessamyn was administered 10 subtests of the Wechsler Adult Intelligence Scale-Fourth Edition (WAIS-IV). Her composite scores are derived from these subtest scores. Much of the below information was obtained from the [facility] scoring program. The primary index scores and the FSIQ are on a standard score metric with a mean of 100 and an SD of 15. A percentile rank (PR) is provided for scores to show Ryane's standing relative to other same-age peers. The scores obtained on the WAIS-IV reflect Genifer's true abilities combined with some degree of measurement error. Her true score is more accurately represented by a confidence interval (CI), which is a range of scores within which her true score is likely to fall. It is common for individuals to exhibit score differences across areas of performance. Scores can also be influenced by motivation,  attention, interests, and opportunities for learning. All scores may be slightly higher or lower if Adasia were tested again on a different day. It is therefore important to view these test scores as a snapshot of Akeema's current level of intellectual functioning.   The Full-Scale IQ (FSIQ) composite score is derived from 10 subtest scores and is considered the most representative estimate of global intellectual functioning. Catelynn's FSIQ fell in the  high average range.  The Verbal Comprehension Index (VCI) is composed of subtests that measure verbal abilities related to reasoning, comprehension, and conceptualization. The VCI includes 3 subtests: Similarities, Vocabulary, and Information. These subtests involve crystallized intelligence. Similarities measures an individual's verbal concept formation and reasoning, Vocabulary measures an individual's word knowledge and verbal concept formation, and Information measures an individual's ability to acquire, retain, and retrieve factual knowledge. Nonverbal reasoning and perceptual organization abilities are measured by the Perceptual Reasoning Index (PRI). [facility] also includes 3 subtests. Block Design assesses an individual's ability to analyze and synthesize abstract visual stimuli. Matrix Reasoning involves fluid intelligence, broad visual intelligence, classification and spatial ability, simultaneous processing, and perceptual organization. Visual Puzzles measures an individual's nonverbal reasoning and an individual's ability to analyze and synthesize abstract visual stimuli. The Working Memory Index provides a measure of an individual's ability to sustain attention, concentrate, and exert mental control. It consists of two subtests: Digit Span and Arithmetic. Both subtests assess an individual's attention and ability to engage in mental manipulation. The Processing Speed Index is a measure of the speed of mental and graphomotor processing. The PSI also consists of  two subtests: Coding and Symbol Search. Both subtests involve short-term visual memory, visual-motor coordination, and psychomotor speed. Shizue's scores in the various domains were variable ranging from Average (Perceptual Reasoning) to Superior (Verbal Comprehension). Her score in the area of Verbal Comprehension was significantly higher than her scores in other areas.  Scale Composite Score Percentile Rank 95% Conf. Interval Qualitative Description  Verbal Comprehension VCI 125 95 118-130 Superior  Perceptual Reasoning PRI 105 63 99-111 Average  Working Memory WMI 114 82 106-120 High Average  Processing Speed PSI 111 77 102-118 High Average  Full Scale FSIQ 117 87 113-121 High Average  General Ability GAI 117 87 112-121 High Average  Confidence Intervals are based on the Overall Average SEMs. [facility] is an optional composite summary score that is less sensitive to the influence of working memory and processing speed. Because working memory and processing speed are vital to a comprehensive evaluation of cognitive ability, it should be noted that the [facility] does not have the breadth of construct coverage as the FSIQ. Verbal Comprehension Subtest Scaled Score Percentile Rank  Similarities 14 91  Vocabulary 16 98  Information 13 84   Perceptual Reasoning Subtest Scaled Score Percentile Rank  Block Design 10 50  Matrix Reasoning 14 91  Visual Puzzles 9 37   Working Memory Subtest Scaled Score Percentile Rank  Digit Span 14 91  Arithmetic 11 63   Processing Speed Subtest Scaled Score Percentile Rank  Symbol Search 13 84  Coding 11 63   CNS Vital Signs CNS Vital Signs is a computerized neuropsychological/neurocognitive test that assesses a broad-spectrum of brain function domain performances under challenge (cognition stress test). Scores help to determine severity of impairment based on an age-matched normative comparison database. The standard scores have a mean of 100 and standard deviation  of 15. Percentile Ranks indicates how the individual scored compared to other subjects of the same age. Subtests completed by individuals to create the domain scores include the Verbal and Visual Memory Tests, Finger Tapping, Symbol Digit Coding, the Stroop Test, Shifting Attention Test, Continuous Performance Tests, and sometimes the Four-Part Continuous Performance Tests. Scores on these various tests are used to create the below domain scores. Of note, the following domains are some of the most sensitive to attention deficit conditions: Processing Speed, [employer], Psychomotor Speed, Reaction Time, Complex  Attention, and Cognitive Flexibility. According to the Validity Indicator, there were concerns about the validity of several of the domain scores, with Complex Attention, Cognitive Flexibility, and Executive Functions being noted as potentially invalid. The concern about validity in these areas was apparently related to Syniyah's performance on the Shifting Attention Test, which asks individuals to respond to shifting rules. On this subtest, Noretta had few correct responses and many errors. The potentially invalid domain scores are listed in italics.   Domain Standard Score Percentile Range of Functioning  Verbal Memory: how well a person can recognize, remember, and retrieve words. 79 8 Low  Visual Memory: how well a person can recognize, remember and retrieve geometric figures. 110 75 Above Average  Psychomotor Speed: how a person perceives, attends, responds to visual-perceptual information, and performs motor speed and fine motor coordination. 120 91 Above Average  Reaction Time: how quickly a person can react to both simple and increasingly complex directions. 106 66 Average  Processing Speed: how well a person recognizes and processes information 110 75 Above Average  Social Acuity: how well a person can perceive, process, and respond to emotional cues. 88 21 Low Average  Working Memory: how  well a person can perceive and attend to symbols using short-term memory processes. 109 73 Average  Sustained Attention: how well a person can direct and focus cognitive activity on specific stimuli.  106 66 Average  Simple Attention: a person's ability to track and respond to a single defined stimulus over lengthy periods of time while performing vigilance and response inhibition quickly and accurately.  106 66 Average  Motor Speed: a person's ability to perform movements to produce and satisfy an intention towards a manual action and goal. 120 91 Above Average  Complex Attention: a person's ability to track and respond to a variety of stimuli and/or perform mental tasks requiring vigilance quickly and accurately. 39 1 Very Low  Cognitive Flexibility: how well a person can adapt to rapidly changing and increasingly complex set of directions and/or to manipulate information.  27 1 Very Low  Executive Function: how well a person recognizes rules, categories, and manages or navigates rapid decision making. 29 1 Very Low    On the CNS, most of Taylee's valid scores fell in the average to above average range. However, Kalisi's score on the Verbal Memory domain fell in the low range, suggesting a possible verbal memory impairment. This score is based on Sedonia's performance on the Verbal Memory Test, which ask individuals to remember 15 words and then select those words when provided a larger list of words containing target words (the words the person was asked to remember) and distractor words (words that were not on the to-be-remembered list) immediately and after a delay. This test measures how well a person can recognize, remember, and retrieve words. Notably, because this was the first CNS subtest that Susann completed, it is possible that lack of familiarity with the format impacted her performance. Nevertheless, during the immediate recall task, Keshonda's ability to correctly select target word was reduced (12%) and she  remembered fewer words after the delay than she did initially (number of correct words after the delay was at the 9%). This indicates that Norrine initially remembered fewer of the provided words than expected and then forgot some of those words after a delay. Preslei's Social Acuity was in the low average range. This domain score is derived from Dreama's performance on the Perception of Emotions Test. This test measures how well a  person can perceive and identify both positive emotions (e.g., happy, calm) and negative emotions (e.g., angry, sad). On this subtest, Tam was slower to respond to the positive emotions than the negative emotions and she made several errors (specifically she correctly responded to the target items where the picture and emotion word matched, but also sometimes responded to pictures and emotional labels as if they matched when they did not). Finally, as noted above, some of the domains relevant to attention (Complex Attention, Cognitive Flexibility, and Executive Functions) were identified as possibly invalid. Scores on these domains tended to fall within the very low range but are hard to interpret given concerns about validity. Validity concerns seemed related to her performance on the Shifting Attention Test (on this subtest Daziya had few correct responses and a high number of errors). The Complex Attention score, for example, is derived from aspects of the Shifting Attention Test, Stroop Test, and Continuous Performance test. It is noted, however, that in addition to challenges on the Shifting Attention Test, Twila also made slightly more errors than expected on the Stroop Test.  Autism Diagnostic Observation Schedule, Second Edition (ADOS-2) The ADOS-2 is a direct observation assessment for autism spectrum disorder. There are five modules in the schedule and the appropriate module is chosen according to the age and expressive, functional language level of the individual being tested. Each module  consists of standardized presses designed to promote interaction and provide opportunities for the individual to exhibit typical social behavior. An algorithm is used to sum specific item codes. On the Module 4, which requires fluent speech and is designed for older adolescents/adults, the original algorithm includes a threshold for the Communication and Social Interaction domains, as well as for the combined Communication and Social Interaction sections that must be met for a classification of autism spectrum disorder. However, according to a subsequently published article, an updated algorithm was created for the Module 4 of the ADOS. Specifically, per the article: "Clinically, the Module 4 revisions yield scores that provide a more accurate summary of ASD symptoms, with an algorithm that is more closely aligned with DSM-5 criteria than the original algorithm. It also affords good sensitivity and improved specificity compared to the original Module 4 algorithm. Although it is always recommended that the ADOS be used as one source of information in a diagnostic battery, good specificity is particularly important in the assessment of adults, for whom parents are not always available to provide the comprehensive developmental history that is often helpful in making differential diagnoses." (Hus, V. & Lord, C. (2014). The Autism Diagnostic Observations Schedule, Module 4: Revised Algorithm and Standardized Severity Scores. Journal of Autism and Developmental Disorders; 44(8), [phone].). As such, in addition to be being scored on the original algorithm, scores on the revised algorithm were obtained and are discussed below.   During the current evaluation on the ADOS-2, Ajiah demonstrated some variability in her social communication skills with a few restricted and repetitive behaviors. Princessa's overall performance during the ADOS-2 was suggestive of an autism spectrum disorder diagnosis, as her scores met ADOS-2  criteria for classification of autism or autism spectrum, on both the original and revised algorithms. Please see the Diagnostic Criteria for Autism Spectrum Disorder section of this report for more information regarding Viann's behaviors during the ADOS-2.  Social Responsiveness Scale, Second Edition (SRS-2) The SRS-2 is a measure that identifies social impairment associated with autism spectrum disorders, quantifies its severity, and differentiates it from that which occurs in other disorders. In additional  to an overall score, the SRS-2 includes 5 treatment subscales (social awareness, social cognition, social communication, social motivation, and restricted interests and repetitive behaviors) and two subscales which are aligned with the DSM-5 criteria for autism spectrum (Social Communication and Interaction, and Restricted Interests and Repetitive Behavior). The SRS-2 was completed by Florence and her husband.    On the self-report SRS-2, Austina's overall score fell in the "severe range". A score in this range indicates challenges with reciprocal social behavior and lead to severe interference with everyday social interactions. Such scores are strongly associated with clinical diagnosis of an autism spectrum disorder. Subscale scores were mildly to significantly elevated.   On the SRS-2 completed by Jadelin's husband, Arayah's overall score fell within normal limits. Scores in this range are generally not associated with clinically significant autism spectrum disorders. Further, none of the subscales were elevated.   The Camouflaging Autistic Traits Questionnaire (CAT-Q) The Camouflaging Autistic Traits Questionnaire (CAT-Q) is a self-report measure of social camouflaging behaviors in adults. It can be helpful in identifying individuals who may be masking their autism related behaviors, complicating diagnoses in these individuals. Factors on the CAT-Q include Compensation (strategies used to compensate for social  and/or communication difficulties), Masking (strategies used to present as showing less- or non-autism related behaviors), and Assimilation (attempts to blend into social situations that make the individual feel uncomfortable, without letting others see this discomfort). In general, higher scores suggest more camouflaging. Results of the CAT-Q indicated that Francia engages in significant camouflaging behaviors.   Generalized Anxiety Disorder score (GAD-7) The GAD-7 is a self-report questionnaire used to assess symptoms associated with stress and anxiety. Scores range from no/minimal symptoms of anxiety to severe symptoms of anxiety. This questionnaire was completed prior to the intake. Results indicated that at the time that the questionnaire was completed Kaliegh was experiencing mild anxiety.   Patient Health Questionnaire (PHQ-9) The PHQ-9 is a self-report questionnaire used for screening, diagnosing, and monitoring for symptoms of depression. Scores range from minimal to no symptoms of depression to severe symptoms of depression. This questionnaire was completed twice, both prior to the intake and during the evaluation visit. Susann's scores fell in the "moderate depression" range at both time points.    The DSM-5-TR Level 1 Cross-Cutting Symptom Measure The DSM-5-TR Level 1 Cross-Cutting Symptom Measure is a measure that assesses mental health domains that are important across psychiatric diagnoses. The adult version of the measure consists of 23 questions that provides brief screening across 13 psychiatric domains (depression, anger, mania, anxiety, somatic symptoms, suicidal ideation, psychosis, sleep problems, memory, repetitive thoughts and behaviors, dissociation, personality functioning, and substance use). Each item asks individuals to rate how much (or how often) they have been bothered by the specific symptom during the past 2 weeks. Each item on the measure is rated on a 5-point scale (0=none or not at  all; 1=slight or rare, less than a day or two; 2=mild or several days; 3=moderate or more than half the days; and 4=severe or nearly every day), and domains where at least one item is rated at least mild (i.e., 2) can suggest areas of concern. For substance use, suicidal ideation, and psychosis, a rating of slight (i.e., 1) suggests that additional inquiry and follow-up may be beneficial. On this measure, concerns were noted in the areas of depression, anger, anxiety, repetitive thoughts and behaviors, dissociation, and personality functioning.   DSM-5 Depression Adult Measure The DSM-5-TR Level 2--Depression--Adult measure is an 8-item version of the PROMIS Depression Short  Form that assesses the pure domain of depression in adults. Scores are converted to T-scores which fall into categories ranging from "none to slight" to "severe". Yer's score fell in the mild range.   DSM-5 Anger Adult The DSM-5-TR Level 2--Anger--Adult measure is a 5-item version of the PROMIS Anger Short Form that assesses the pure domain of anger in adults. Scores are converted to T-scores which fall into categories ranging from "none to slight" to "severe". Tera's score fell in the 'none to slight' range.   DSM-5 Anxiety Adult Measure The Anxiety Measure assess anxiety in individuals 108 or older.  Each item is rated on a 5-point scale with higher overall scores indicating a greater severity of anxiety. Emmabelle's scores suggest that they are experiencing a moderate level of anxiety symptoms.  DSM-5 Repetitive Thought and Behaviors Scale The DSM-5-TR Level 2--Repetitive Thoughts and Behavior--Adult measure is an adapted version of the 5-item [place] Obsessive-Compulsive Inventory (FOCI) Severity Scale (Part B) that is used to assess the domain of repetitive thoughts and behaviors in individuals age 11 and older. Each item on the measure is rated on a 5-point scale (i.e., 0 to 4). The higher the total score, the greater severity of  repetitive thoughts and behaviors. Monigue's scores were in the mild to moderate range.     Severity Measure for Social Anxiety Disorder (Social Phobia)--Adult The Severity Measure for Social Anxiety Disorder (Social Phobia)--Adult is a 10-item measure that assesses the severity of symptoms of social anxiety (social phobia) in adults. Each item on the measure is rated on a 5-point scale (0=Never; 1=Occasionally; 2=Half of the time; 3=Most of the time; and 4=All of the time). A total score is calculated, with higher scores indicating greater severity of social anxiety. On this measure Adalynn's scores indicated that Evette is experiencing symptoms of social anxiety that are of moderate severity.  Severity Measure for Generalized Anxiety Disorder--Adult The Severity Measure for Generalized Anxiety Disorder--Adult is a 10-item measure that assesses the severity of generalized anxiety disorder in adults. A total score is calculated, with higher scores indicating greater severity of generalized anxiety disorder. On this measure Benigna's scores indicated that Aeon is experiencing symptoms of GAD that are of mild severity.   Conners' Adult ADHD Rating Scale (CAARS) -Short Form Hermela completed the Conners' Adult ADHD Rating Scales (CAARS)- Short Form. This measure provides information regarding symptoms of ADHD. Scores are T-scores with a mean of 50 and a standard deviation of 10. T-scores between 45-55 are considered average, while T-scores above 70 are considered very much above average.   Scale T-Score  Inattention/Memory Problems: degree to which the individual learns slowly, has difficulty organizing or completing tasks, and has trouble concentrating 86  Hyperactive/Restlessness: degree of which the person has difficulty working at the same task, feels more restless and is more "on the go" than others 63  Impulsiveness/Emotional Lability: degree to which the individual engages in impulsive acts, his/her mood changes  quickly, and/or is more easily angered and irritated by people 37  Problems with Self-Concept: degree to which a person shows poor social relationships, low self-esteem, and/or low self-confidence 59  ADHD Index: level of clinically significant ADHD symptoms 59   Adult ADHD Self-Report Scale (ASRS - v1.1) Symptom Checklist The ASRS is an 18-question questionnaire designed to help screen for AD/HD in adults. Of the eighteen questions, six have been found to be most predictive of symptoms consistent with AD/HD, with endorsement of at least 4 of these six items suggesting that the  individual has a symptom profile that is high consistent with AD/HD in adults. In these cases, further investigation is warranted. Ashlin endorsed 5 of the 6 key items as occurring "often" or "very often", which is highly consistent with AD/HD in adults. She also indicated several of the other behaviors were in the range indicating concerns. Overall, she described that she often has difficulty wrapping up final details of projects, getting things in order when she has a task that requires organization, and remembering appointments or obligations. She also often makes careless mistakes when working on a boring or difficult projects and is distracted by noises. She very often delays starting tasks that will require a lot of thought and fidgets or squirms with her hand or feet when she must sit for long periods. She also very often has difficulty keeping her attention on boring tasks, concentrating on what people are saying, and unwinding and relaxing when she has time to herself.  Toniya also very often feels restless or fidgety and misplaces or has difficulty finding things at home.   Symptom Checklist  Dody's sibling (who is 6 years older than Alverna) completed an AD/HD symptom checklist to rate the frequency that Kriste displayed 18 symptoms of AD/HD (9 inattentive and 9 hyperactive-impulsive) from ages 85-12. Given the somewhat limited age  difference between Isidra and her sibling, scores need to be interpreted with some caution. On this questionnaire, Evadne's sister indicated that Alydia engaged in most of the inattentive and hyperactive impulsive symptoms "sometimes". She did not rate any of the symptoms as occurring "often" or "very often" from ages 94-12.  Michaelene also completed the AD/HD symptom checklist about herself to rate the frequency that she displayed symptoms of AD/HD from ages 58-12, as well as currently. On this questionnaire, Emmer rated herself as showing 9 out of 9 symptoms of inattention and 6 out of 9 symptoms of hyperactivity-impulsivity "often" or "very often" from ages 30-12. She rated herself as showing 3 out of 9 symptoms of inattention and 2 out of 9 symptoms of hyperactivity-impulsivity "often" or "very often" in the last 6 months. Given that Taurus's ratings of her current symptoms seemed discrepant from other self-report questionnaires completed by Aveena, this was further queried during the results meeting. Shakiah reported that this discrepancy was likely based on being asked to rate both her behaviors in childhood and current symptoms on the symptom checklist. Specifically, because she feels that she has shown improvements since childhood, she may have underreported her current symptoms after thinking about and comparing her behaviors to her childhood presentation. She indicated that other questionnaires that just asked to her to rate her current behaviors were likely a more accurate reflection of her views of her current symptoms on a day-to-day basis, as they were not influenced by her recall of the severity of her behaviors previously.   The Behavior Rating Inventory of Executive Function-Adult Version (BRIEF-A) The Behavior Rating Inventory of Executive Function-Adult Version (BRIEF-A) is a standardized rating scale was designed to assess everyday behaviors that are indicators of an individual's executive functions. Much of the below  information is from the BRIEF-2 scoring program. T scores (M = 50, SD = 10) are used to interpret the individual's level of executive functioning on the BRIEF-A. Usually, T scores at or above 65 are considered clinically significant.  On the self-report version of the BRIEF-A, Validity Indicators suggest that the profile is likely valid. The Behavioral Regulation Index [facility]) captures the ability to maintain appropriate regulatory control  of one's own behavior and emotional responses. The Metacognition Index (MI) reflects the individual's ability to initiate activity and generate problem-solving ideas, to sustain working memory, to plan and organize problem-solving approaches, to monitor success and failure in problem solving, and to organize one's materials and environment. Examination of the indexes reveals that both the Behavioral Regulation Index and Metacognition Index were elevated. In addition, one or more of the individual BRIEF-A scales were elevated, suggesting that Maddux reports difficulty with some aspects of executive function. Specifically, concerns were noted with Desere's ability to inhibit impulsive responses, adjust to changes in routine or task demands, monitor social behavior, initiate problem solving or activity, sustain working memory, plan and organize problem-solving approaches, attend to task-oriented output, and organize environment and materials.   Scale/Index Raw score T score Percentile 90% CI  Inhibit 16 66 99 57 - 75  Shift 16 84 >99 76 - 92  Emotional Control 16 54 76 49 - 59  Self-Monitor 13 69 98 61 - 77  Behavioral Regulation Index (BRI) 61 69 98 64 - 74  Initiate 21 83 >99 75 - 91  Working Memory 19 79 >99 71 - 87  Plan/Organize 24 79 >99 73 - 85  Task Monitor 14 76 >99 67 - 85  Organization of Materials 18 68 96 61 - 75  Metacognition Index (MI) 96 81 >99 77 - 85  Global Executive Composite (GEC) 157 78 >99 75 - 81    One of Kynisha's friends also completed the BRIEF.  Examination of validity indicators suggest that the profile is likely valid. On this measure, the Behavioral Regulation (BRI) and Metacognition (MI) Indexes were within normal limits and none of the individual BRIEF-A scales were elevated.   Scale/Index Raw score T score Percentile 90% CI  Inhibit 11 49 68 41 - 57  Shift 8 47 54 40 - 54  Emotional Control 10 40 18 36 - 44  Self-Monitor 12 61 91 55 - 67  Behavioral Regulation Index (BRI) 41 48 54 45 - 51  Initiate 11 50 64 43 - 57  Working Memory 14 61 89 55 - 67  Plan/Organize 14 51 68 46 - 56  Task Monitor 10 57 82 50 - 64  Organization of Materials 15 56 80 51 - 61  Metacognition Index (MI) 64 55 75 52 - 58  Global Executive Composite (GEC) 105 52 67 49 - 55    Diagnostic Criteria for Attention Deficit/Hyperactivity Disorder from the DSM-5  The Diagnostic and Statistical Manual of Mental Disorders, Fifth Edition (DSM-5) is the handbook currently used by health care professionals in the [place] and much of the world as a guide to diagnosis. The DSM-5 contains descriptions, symptoms, and other criteria for diagnosis. Below are some of the DSM-5 criteria for Attention Deficit/Hyperactivity Disorder (ADHD). Presence of sufficient evidence is determined according to clinical judgment, which is informed by interviews and assessment with the individual, his or her family, and other people who are familiar with Lynlee's behavior.   This section describes persistent patterns of inattention and/or hyperactivity-impulsivity. To meet criteria for a diagnosis of ADHD the individual must meet criteria in the area of inattention (A1), OR hyperactivity-impulsivity (A2), OR both. For each of the below criteria the Evidence column indicates whether there is evidence that the criteria are met. Possible responses include No, Minimal, Some, and Yes    Evidence?   A1. Significant symptoms of inattention? Yes  From Interview with Juna Naasia reported a history  of  making careless mistakes despite trying to be careful and checking her work to see if she missed anything. For example, at work there have been times when she tried to schedule the same meeting twice.     Grissel has difficulty sustaining her attention. For example, she cannot just watch television but also must be doing something else. At work, when she is focused on a task but gets sidetracked (e.g., by an interruption, an email, or having to look up information) she may become involved in the new task and will have difficulty getting back to the original task. She sometimes must force herself back to her original task or will use self-talk to help her refocus (e.g., reminding herself what she is supposed to be working on and that she must put the new task/activity on hold for now).    Cai has difficulty listening when others talk to her, including sometimes being unable to focus on what someone is saying and/or zoning out during conversations. For example, if someone tries to talk to her when she is thinking about something else, it may take Anjolina a while to focus on and listen to what is being said. In another example, during work meetings Karyl may focus on the materials that they were provided rather than listening to what the presenter is saying. In addition, her husband has reportedly told her that she does not listen.     Janus described herself as forgetful, and often must write down tasks that she needs to complete in order to remember to do them. Even so, however, she still may end up forgetting a scheduled meeting at work, for example. People in her life have also reportedly told her that she forgets things. For example, her husband will say that they have talked about something already, but Piper does not recall the conversation. She also will sometimes agree to do something but forgets about the task. There have also been times when Briele described an event or outing to a friend only to have them remind her that  they were at the activity or event with her (e.g., she may try to tell someone about a movie only to have the person remind her that they saw the movie together). As noted above, during childhood Freada would sometimes forget her backpack, to show her mother necessary paperwork, and/or to take her homework home to complete it.     Annette has difficulty following through and completing tasks (e.g., at work she often must double check that tasks are complete), will put off tasks that require mental effort, and struggles to start tasks she knows will be difficult. For example, when she has several tasks that need to be completed, she tends to seek out the easier task first. In general, Moet has an easier time completing new tasks or tasks with a deadline, and struggles when a task does not seem important or when there is no deadline for task completion. For example, as part of a prior job she was required to enter notes, but her notes were typically only 60% complete until the deadline for which all notes had to be entered approached. In addition, Julliette described having difficulty with time management and turning in assignments through college. For example, when she was in school despite knowing that a paper was due at the end of the week, Anastashia may not have started it until the night before it was due or would work on an assignment on the bus on  the way to school.     Korine can be easily distracted. For example, there are times when she starts to write down a task but becomes distracted by her overall task list or thinking about her to-do list.    Zanetta has a history of misplacing her belongings, though this improved somewhat after she designated specific places for particular objects. Nevertheless, she continues to misplace things at times. For example, recently she plugged her phone in somewhere that she typically does not, and then could not find her phone and had no idea where it was.    Although Denna has currently developed  some successful organizational strategies at work, she can be overwhelmed by large organizational tasks. For example, her closet at home is disorganized and organizing it feels overwhelming to her, so she has not been able to make progress on getting it organized. She also can have difficulty maintaining organization. 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 During this job she also never found a good way to manage all her paperwork or stay on top of all of the necessary grading.   Information from Questionnaires  Information from self-report questionnaires was generally suggestive of significant inattentive symptoms, though there was some variability in Mikaylah's ratings (see above). Both Brunella and her sister reported that Jamieson displayed at least some symptoms of AD/HD in childhood. Observer ratings of current behavior tended not to be suggestive of significant inattentive symptoms, however. Please see above for more information.    A2. Significant symptoms of hyperactivity-impulsivity? Some  From Interview with Jaslyne: Cynthia described herself as fidgety and noted that she can also be restless. She has difficulty engaging in quiet activities and often wants music or noise in the background when completing a quiet activity. She has a history of having difficulty staying seated, but currently can stay seated when necessary (e.g., during in-person staff meetings). However, when staying seated is not required (e.g., during virtual staff meetings) Nastasia may get up and move around.    On the other hand, Ian is not always on the go and usually does not talk excessively (Sully tends to be quiet and may not elaborate when talking with others), though this can be dependent on the situation and her mood, as there are times when she talks a bit more or will go off on a tangent. Secily tends not to interrupt others frequently and  does not blurt out answers. She also waits her turn without difficulty.   Information from Questionnaires Information from self-report questionnaires was generally suggestive of a few hyperactive-impulsive symptoms, though there was some variability in Kalianne's ratings. Both Reiley and her sister indicated that Afsa displayed some hyperactive-impulsive behaviors in childhood. Please see above for more information.    Taken together, there is evidence of challenges with inattention (A1) and some challenges with hyperactive-impulsive behaviors beginning in childhood (DSM-5 criteria B, symptoms were present prior to age 57). Based on self-report, symptoms are present in two or more settings (DSM-5 criteria C) and are causing clinically significant impairments (DSM-5 criteria D), despite Sheina having developed a number of compensatory strategies over the years which have been helpful for her. At this time, it does not appear that symptoms are better explained by another disorder (DSM-5 criteria E).    Diagnostic Criteria for Autism Spectrum Disorder from the DSM-5  Below are some of the  primary DSM-5 criteria for Autism Spectrum Disorder (ASD). Presence of sufficient evidence is determined according to clinical judgment, which is informed by interviews and assessment with the individual, his or her family, and (when necessary), other people who are familiar with Shakema's behavior.   This section addresses persistent deficits in social communication and social interaction across multiple contexts and is manifested by sub criteria A1 through A3, all of which must be met either currently or by history for a diagnosis to be provided. For each of the below criteria (A1-A3) the Evidence column indicates whether there is evidence that the criteria are met. Possible responses include No, Minimal, Some, and Yes. The overall A criteria is considered met if areas A1-A3 are all marked Yes.     Evidence?   A1: Challenges with  social-emotional reciprocity? Some  Information from Parent: According to some logs kept by Arabell's mother and shared with the examiner, when Jodeci was around 68 months old, she turned when her name was called. When put on the floor, Beckey became upset turned toward her mother and said mama. By the time that she was about 60 months old, Sibley was saying mama regularly. She was starting to say other words around 41 months of age, and Kali clapped her hands when she did something she was proud of when she was around 18 months old. When she was a-year-old, Idalys noticed that she upset her mother after stepping on her hair and then offered her mother comfort by hugging, patting, and kissing her. She also followed some basic commands and clapped when her parents clapped.   Information from Sibling: According to paperwork from her sister, Fatim did not have difficulty with back-and-forth conversations or communication in childhood. She would approach her sister just to be friendly, offered to share without being asked, and offered comfort when others were sad.   Information from Spouse: According to Karine's spouse, Cherrise does not seem to have difficulty with reciprocal conversations and overall conversations with Dorreen seem pretty fluid. Although Mckinlee may not be as talkative around new people when she is trying to make a good impression, and there may be a bit of awkwardness in the "first overtures of friendship", for the most part Shacoya does well talking with unfamiliar people. Although small talk may not be her "favorite thing" she does not seem to have any difficulty engaging in it. Alfredo recognizes and responds to social cues, social conventions, and social niceties, and does not have any difficulty understanding or using sarcasm. Her husband indicated, for example, that Theadora has a sarcastic sense of humor at times.     From Interviews with Varnika Itzell reported that across her life span she never really felt like she fit in. Despite having  friends in elementary school and going to outings with peers, for example, she never really felt like she was part of a group. Jacqulin also sometimes struggles to know if people are being kind to her because they genuinely want to start a friendship or if they are just being nice. When she was younger, for example, a person she liked asked her to attend a banquet, but Yamari was unsure if he was genuinely asking or was "put up to it", so responded by saying "are you serious." He seemed to take this statement as a rejection, even though it was not intended that way. In addition, Efrata has had a friend for over 20 years but still questions if this individual is really a friend  or if they only hang out with Glorine because they are spending time with mutual friends.    Aarushi has difficulties with conversations, including difficulty following the flow of conversation, figuring out what she wants to say and the way she wants to say it, and jumping in and responding to other's statements. For example, during group conversations Nakari may struggle to come up with something to say before the conversational topic has moved on, or she will feel unsure about when to jump in and make a comment. When a conversation is about a topic of interest, or something that Raiyah has a strong opinion about, she sometimes takes over the conversation a bit more. In addition, Nell noted that her "trains of thought are squiggly instead of a straight line" so she will sometimes make comments that make sense and feel related to her, but others seem to feel are completely unrelated to the topic at hand. She has the most difficulty with face-to-face conversations and finds text conversations the easiest to engage with.    Reegan indicated that she has some difficulty starting conversations. For example, she tends to default to a question about someone's day rather than initiating a conversation by referring to something that she and the individual talked about in the  past. In general, Rylinn does not find small talk interesting, though she knows it is important for social reasons.    Payslie has some difficulties with social conventions and niceties, which she identified as one of the reasons she has had trouble developing friendships. In high school, she had some experiences of saying things that she thought were going to be funny but ended up offending someone. As such, currently Bettina tries to hold back so that she does not offend anyone. Julie also misses hints in conversations, has trouble with inferencing, and can be too literal at times. However, she can understand sarcasm. Overall, Redonna reported that she is a bit better currently at recognizing social cues than she used to be, and she tries to compensate for any challenges by smiling, being friendly, and saying hello to people. Nevertheless, even in adulthood she has accidentally hurt someone's feelings despite trying to word her statements carefully.   Observations from the ADOS-2 During the ADOS-2, Vernida was responsive to most conversational bids offered by the examiner, though on at least one occasion she struggled to maintain a conversation. Nevertheless, she offered information about herself and occasionally asked the examiner questions about her thoughts and experiences. Zakiyah's report of a non-routine event was clear and able to be followed.     Cumi inconsistently shared enjoyment with the examiner during the ADOS-2. Although she was able to use words to describe her emotional experiences the overall effectiveness of her communication was reduced. She also tended not to label the emotional experiences of others.     Her social overtures and responses could appear a bit reduced in quality, as Gabrianna often seemed uncomfortable or unsure during the social interaction. As such, although the overall interaction was sometimes comfortable, it was not sustained.     A2: Challenges with nonverbal communicative behaviors used for social  interaction? Some-Yes  Information from Sibling: According to paperwork completed by her sister, Aisha did not show less eye contact than expected in childhood.   Information from Spouse: According to her husband, during serious conversations Sabrie may sometimes make less eye contact than expected. On a day-to-day basis, however, he does not notice anything different about Dondrea's eye contact. Her  facial expressions seem congruent with how she is feeling, and she does not seem to have difficulty recognizing or responding to other's facial expressions.    From Interviews with Ruble Leidi reported that currently her eye contact is a bit better than it used to be. Her facial expression is often neutral, so it may be challenging for others to tell how she is feeling from her facial expressions alone. Although Jessah can recognize and respond to other's facial expressions, she tends not to notice their body language.   Observations from the ADOS-2 During the ADOS-2, Kahli inconsistently used eye contact to begin, regulate, and terminate interactions. Although she sometimes checked in with the examiner using eye contact, here eye contact tended to be brief rather than sustained. For example, there were times when Alaiza was talking to the examiner but looking towards the window or the wall.     Although she occasionally used gestures, the overall frequency was reduced, as Tecia mostly used descriptive gestures when prompted to do so and inconsistently used emphatic gestures.     Her range of facial expressions was reduced, as she mostly had a neutral facial expression with occasional smiles.     A3: Challenges with developing, maintaining, and understanding relationships? Some   Information from Sibling: According to information provided by her sister, during her childhood Alexica was interested in other children her age. She would approach peers that she did not know and responded positively to peers that approached her. She did not  seem to have difficulty making or keeping friends, or seem more interested in older or much younger children than peers her own age.   Information from Spouse: Onda's husband described Iyahna as "not the most outgoing person" and noted that she has experienced some challenges with friendships. However, he seemed to relate some of this challenge to anxiety. For example, he has observed that when an individual is similar to and compatible with Kaysie, she will appear comfortable during interactions (e.g., she has seemed to be compatible with some recent acquaintances). When she does not mesh as well with someone, Elizabella may continue to appear a bit uncomfortable during interactions with them even if they have known each other for years. Yolander has also made some friends recently.    From Interviews with Miana When playing during childhood, Kirstin's sister tended to lead the play and Shanoah would just follow along with what her sister was doing. In elementary school, Yeira was interested in peers, invited people to her birthday party, was invited to a few birthday parties and sleepovers, and had a best friend. For example, Breonia would spend time with her neighbors and continued to have sleepovers with them even after they moved out of the neighborhood. However, as noted above there were children that she described as friends at the time but did not know their names.     In middle school, Bernise's best friend developed friendships with others, and Eri was left out of these interactions and get-togethers. Tattiana talked to a few peers, but they did not spend time together outside of school. Kahlie lived with her father briefly during ninth grade before returning to live with her mother. During her stay with her father, Quinlyn experienced some negative situations. When she returned, her best friends from middle school no longer talked to her and Pahola described that she closed herself off to others (e.g., one of her friends that she knew during middle school  reportedly told Dannie that she was  quieter after living with her father). For example, during high school despite having a person that she got together with outside of school and talked to "all the time" she did not think of this individual as a friend. She also spent time with, talked to, and sat at lunch with peers, but felt lonely and like an outsider, and would have said that she did not have any friends (of note, during the ADOS-2, Adelia indicated that her group of friends from high school seemed to consist of peers that were neurodivergent). Adisson reported that she always wanted to talk to people but felt like she did not know how to, and described feeling as though other people had a manual about how to talk with people that she did not have.    As described above, recently Brizeyda's relationship with a long-term friend has been strained, though Yadhira is unsure of why. She has also had difficulty becoming friends with her coworkers. Randell noted that she can be more open in situations where she feels comfortable, but if she uncomfortable she tends not to be very open.   Observations from the ADOS-2 During the ADOS-2, Azucena generally showed appropriate insight into social relationships. For example, when asked about how she knows someone is a friend, Arwa stated that it "helps if they say it but usually people are not that direct". Nevertheless, she was able to identify other ways to tell if someone was a friend.     This section addresses restricted, repetitive patterns of behavior, interests, or activities. To meet this criterion, Camari must have characteristics in at least two of the four sub criteria either currently or by history, which address repetitive behaviors and speech, rigidity, intense interests, and sensory interests/aversions. For each of the below sub criteria (B1-B4) the Evidence column indicates whether there is evidence that the criteria are met. Criteria are considered met in this area when at least 2 out  of the 4 sub criteria (B1-B4) are marked Yes.  If the overall B criteria is met, the recommended level of support is indicated on a scale of 1-3: Level 1 (Requiring Support), Level 2 (Requiring Substantial Support), or Level 3 (Requiring Very Substantial Support).    Evidence?   B1: Stereotyped or repetitive motor movements, use of objects, or speech? Some  Information from Sibling: According to her sister, Lydiana did not show repetitive play with objects during childhood.   Information from Spouse: Her husband has observed Leira to bounce her leg when she is standing while talking to someone and feeling uncomfortable. She also constantly stretches her hands.    From Interviews with Vashti Jolita reported that she did not engage in much repetitive play with objects other than taking Barbies apart during childhood. She would sometimes go up on her toes when she was thinking about something, though Marcelia also noted that she was also in ballet through elementary school. She engages in some skin picking and lip biting, and stretches her fingers.    Goldye has a history of using lines from television shows or movies. For example, she may use a line from media when she does not know what to say but can think of something from media that fits the situation. However, she described that her friends and husband are also "nerds" so her quoting from media does not seem out of the ordinary in that context and appears similar to what people around her are doing.   Observations from the ADOS-2 During the ADOS-2,  Jewels showed a few somewhat repetitive finger movements, though these were a bit unclear (e.g., she seemed to repeatedly rub her fingertips together). She also showed a repetitive shrug of one shoulder.     B2: Insistence on sameness, inflexible adherence to routines, or ritualized patterns of verbal or nonverbal behavior? Some-Yes  Information from Sibling: According to her sister, Shera did not have difficulty with changes or  routines that she insisted on in childhood.    Information from Spouse: Dennise's husband indicated that he has not observed Jerolene to have difficulty with changes or transitions.    From Interviews with Aleina Lea, however, indicated that she has difficulty with transitions, including challenges starting tasks and moving from one task to another. For example, she may sit in her car to for 5 to 10 minutes after arriving at work before walking in. She also feels annoyed if she interrupted when working on something, and at home she is always focused on doing something so "everything feels like an interruption". For example, although she wants to talk to her husband in general, she will feel unhappy if he tries to initiate a conversation when she is in the middle of an activity like watching a YouTube video. In addition, when she must change tasks before the initial task is complete, Missy has difficulty transitioning to the new task and then also has difficulty getting back to her original task.    Deshanta can have difficulty with changes (e.g., a new process at work, changes to furniture at home, etc.). For example, her husband has had a new car for a long time, but she still sometimes references the old car. Arina also likes her morning routine to stay the same. For example, she had to change the word game she played in the morning, and since the shift has felt like something is missing from her morning even though she is still playing a word game.    Vicktoria also has difficulty with cognitive flexibility. For example, if she is told that she has to rearrange something it is hard for her to think of how to do so in a different way than it is already arranged.     B3: Highly restricted, fixated interests that are abnormal in intensity or focus? Some-Yes  Information from Sibling: According to her sister, Malinda did not have intense interests in childhood.    Information from Spouse: Mayan's husband indicated that Mackayla is a "big fan or  musicals" but the level of her interest does not seem to be more than other fans.    From Interviews with Alethia Suman reported that she used to really like pink and would dress from head to toe in pink and came up with a rhyme about being pink. She always notices colors and when colors match. She has a history of doing deep dives into a topic she finds interesting, such as the octopus, which she has been interested in for a while. In high school, she was interested in dolphins (e.g., for a time everything she wore was dolphin related). The duration of her interest is variable.   Observations from the ADOS-2 Although she did not talk about this topic excessively or bring it up often, Marny provided some specific information about various media.      B4: Hyper- or hyporeactivity to sensory input or unusual interest in sensory aspects of the environment? Some-Yes  Information from Sibling: According to her sister, Tearra did not show sensory seeking or avoidance  behaviors in childhood.    Information from Spouse: Kema's husband has observed that Shirlie avoids polyester in clothing but has not noticed any other sensory seeking or avoidance.    From Interviews with Amelia Uilani prefers dim light. She likes cotton clothing and dislikes the feeling of polyester fabric because it has an "oily feel that sticks around". She does not like feeling things that are slimy or gritty (e.g., although she can tolerate walking on the beach, she does not like sand very much). She also dislikes gritty food textures (e.g., quinoa) and any sudden loud noises.    As a child she liked putting glue on her hands and then peeling it off and would stick safety pins through the first layer of her skin. She chewed on pencils and sucked on pens until the day she ended up with a mouthful of ink.     Santosha is sensitive to smells. For example, the smell of lavender helped her during birth, and she can always tell when someone has been wearing cologne.    Taken  together, Norina described and was observed to show some restricted and repetitive interests and behaviors and have some challenges in the area of social communication and interaction skills. However, at this time evidence of these behaviors in early childhood is unclear (DSM-5 criteria C is that symptoms were present in early childhood). For example, Betsey's sister did not report Karlita to show any social communication differences or restricted or repetitive behaviors during childhood, and Abbe's description of her childhood social communication challenges were a bit unclear and/or could have been related to anxiety. For example, Lavere discussed feeling as though she did not have friends in high school but also reported talking to peers and sitting with them at lunch and having a person that she spent time with outside of school and talked to all the time, making it unclear what lead Peggyann to feel like she did not have friends. Further, some of her currently described and observed challenges with things like conversations could be explained by anxiety and/or attention difficulties, which complicates interpretation of her behavior (DSM-5 criteria E is that symptoms are not better explained by another disorder), and her spouse did not report many behaviors consistent with a diagnosis such as autism spectrum disorder. Thus, although there is some evidence supporting an ASD diagnosis, her overall presentation in this area is not entirely clear at this time.      Summary and Diagnostic Impressions:  Otis was seen for an evaluation at [facility] due to concerns about her attention, social interaction skills, anxiety, and mood. Relevant background information includes that Kobe was born full term after a pregnancy complicated by high blood pressure. She reached her early developmental milestones when expected. Navea has been previously diagnosed with anxiety and depression.   During the current evaluation, Andreal  completed cognitive testing. Specifically, the WAIS-IV was used to assess Ronnett's cognitive functioning in a range of domains. It is important to note that cognitive scores can be influenced by a variety of factors. Therefore, scores are best interpreted as a snapshot of the individual's current cognitive functioning. On the WAIS, Merelyn's Full-Scale IQ score fell in the high average range. Her domain scores were variable ranging from Average (Perceptual Reasoning) to Superior (Verbal Comprehension). On a computerized neurocognitive battery, most of Rudy's valid score fell in the average to above average range, though scores were lower in the Verbal Memory (low range) and Social Acuity (low average range)  domains, suggesting that Debroah may have some verbal memory impairment and slight weaknesses in perceiving, processing, and responding to emotional cues. Several other domain scores fell in the very low range (Complex Attention, Cognitive Flexibility, and Executive Functions), though these scores were identified as possibly invalid due to Jailah's performance on the Shifting Attention Test (on this subtest Marlisha had few correct responses and made a high number of errors). Nevertheless, although scores need to be interpreted with caution, results suggest that there may be some areas of concern in domains relevant to attention.   Regarding emotional and behavioral functioning, Macarena has been previously identified as having anxiety and depression. Given results of the evaluation, it appears that Elizabet meets criteria for the diagnosis of social anxiety disorder (F40.10). She also described a history consistent with generalized anxiety disorder, though she has learned some strategies to manage this type of anxiety, so it is not entirely clear that she meets full criteria at this time. 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 She also has a history of skin picking which has  improved a bit recently but is still occurring. 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 Further, Shanira has a history of depression and questionnaires completed over time suggest that Amazin continues to experience significant symptoms of depression (e.g., PHQ-9 scores fell in the "moderate depression" range both prior to the intake and during the evaluation visit). As such, Sebrena will retain her prior diagnosis of major depressive disorder (33.0).     Regarding various neurodevelopmental conditions, Shandy reported a history of challenges with attention and impulsivity/hyperactivity. Her self-report of symptoms in childhood was significantly elevated and on a developmental history form her mother indicated that there were challenges with Yomaira's behavior and attention in childhood. Her sister, whose report needs to be interpreted with some caution because she is only 6 years older than Kylie, indicated on a questionnaire that between the ages of 5-12 years Mora engaged in most of the inattentive and hyperactive impulsive symptoms "sometimes". Bonny also described challenges consistent with AD/HD symptoms during childhood (e.g., getting into trouble for not turning in her homework). Thus, there appears to be evidence of a history of challenges consistent with AD/HD, with at least some symptoms expressed in childhood. Regarding current symptoms, Arda's friend did not rate Cabrina as showing significant executive functioning challenges and her husband did not report clear attention concerns during a brief phone interview, though attention concerns were not the focus of the discussion. On the other hand, Autum's response to self-report questionnaires about current symptoms of AD/HD could be a bit variable but were generally suggestive of ongoing concerns in this area. During interviews, Yomira described ongoing challenges with inattention despite having worked to develop strategies to help to  mitigate the impact of these challenges, and overall although these strategies have been helpful, they took time to develop, require ongoing effort and vigilance to maintain, and are not always successful. Taken together, Venissa's overall diagnostic profile related to AD/HD is a bit complicated. However, based on the integration of the results from the current diagnostic assessment, self-reported behaviors, and behavioral observations, there does appear to be sufficient evidence supporting the diagnosis of Other Specified Attention Deficit Hyperactivity Disorder (F90.8, this diagnosis was provided due to the lack of another reporter corroborating current attention challenges and some variability in Herlinda's self-report). Research has shown that the impairment in ADHD is not simply in  the ability to pay attention, but often includes weaknesses in the central management systems of the brain that is referred to as "executive functions." Executive functions include a wide range of self-regulation processes that connect, prioritize and integrate other cognitive processes. Thus, Kadence would benefit from interventions targeting the development of her executive functioning skills. In addition, her attention and focus should continue to be monitored as she continues to seek treatment for her anxiety and mood challenges. Should her attention picture appear different in the future, a reevaluation should be considered to ensure that the diagnosis of AD/HD continues to be an appropriate fit for Sydny's presentation.   Finally, when considering an autism spectrum diagnosis, several areas are taken into account, including an individual's social communication behavior, the presence of restricted/repetitive behaviors, reported developmental history, and current developmental functioning. As part of the current evaluation the ADOS-2 was completed with Pierre. During the ADOS-2, Starlena demonstrated some variability in her social communication  skills with a few restricted and repetitive behaviors. Fronnie's overall performance during the ADOS-2 was suggestive of an autism spectrum disorder diagnosis. In addition, her score on the self-report SRS-2 was in the range that is strongly associated with a diagnosis of autism spectrum disorder and results of the CAT-Q indicated that Lariah engages in significant camouflaging behaviors. Thus, Joshlynn's current presentation was suggestive of a possible autism spectrum disorder diagnosis. However, evidence of concerns in childhood was a bit limited as Erza's sister reported minimal challenges with social communication skills and restricted and repetitive behaviors in childhood and developmental history provided by Mckaylin regarding her childhood presentation was a bit unclear. Additionally, Minette's presentation and description of her difficulties is further complicated by her challenges in the areas of anxiety and attention, as some of the social communication challenges described by Deshante and observed by the examiner could be explained by her difficulties in these areas. Finally, the SRS-2 completed by her husband was in the range that is generally not considered consistent with clinically significant autism spectrum disorders and during a brief phone interview her husband reported minimal challenges with social communication skills and minimal restricted and repetitive behaviors. Taken together, at this time although Denise shows a number of features of ASD and there is some evidence to support this type of diagnosis, there is not enough evidence to provide a diagnosis currently (in other words, Amere does not appear to meet full DSM-5 criteria for autism spectrum disorder at this time). Rather, current evidence suggests that Violanda has features of ASD in the context of another neurodevelopmental condition (AD/HD). Nevertheless, she may benefit from supports in the social area, and her social communication skills and restricted and  repetitive behaviors can be monitored as she continues treatment for her concerns in other areas. Should significant social communication and restricted and repetitive behaviors remain, despite improvements in other areas, the diagnosis of ASD can be reconsidered.   Overall, Eleah would benefit from additional supports and interventions targeting her skills in several areas. As such, recommendations for intervention services and ongoing supports were provided. A detailed review of recommendations is listed below.   Recommendations:  The following recommendations are based on findings from the present evaluation and include a variety of recommendations including some from various scoring programs.  If certain services are already being obtained based on a previous recommendation, please continue with those services:  It is recommended that Shamecka share the results of this evaluation with her primary care physician.  It is recommended that Lalena talk to  her primary care provider about her sleep challenges to determine if further evaluation for her fatigue would be beneficial. Of note, if her sleep and general fatigue improve in the future and symptoms of AD/HD decrease, it would be reasonable to reexamine AD/HD to ensure that this diagnosis continues to be an adequate fit for Courteney's presentation.    Treatment of ADHD in adults often includes medical and psychological/behavioral intervention.  For example, it would likely be helpful for Virgie to continue working with a mental health professional to strengthen her executive functioning skills, increase her emotional and behavioral regulation, and address anxiety and mood concerns. Some potential therapeutic approaches to consider include Dialectical Behavioral Therapy (DBT) and Cognitive Behavioral Therapy (CBT). Additionally, it is recommended that Kian and her primary care provider and/or psychiatrist discuss whether it would be beneficial to have medication  included as part of Dhwani's treatment plan. Due to the complexity of her presentation, for example, it may be helpful to discuss if a medication that targets both symptoms of anxiety and attention could be beneficial.    Continue to monitor Sharisa's social communication skills. Should concerns about these behaviors continue despite improvements in other areas, or increase in the future, reevaluation for something like ASD can be considered.  Individuals with ADHD often have difficulties with executive functioning. Executive functions include a wide range of self-regulation processes that connect, prioritize and integrate other cognitive processes. A book that may be helpful for Elliotte is Smart but Scattered Guide to Success: How to Use Your Brain's Executive Skills to Keep Up, [employer], and Get Organized at Work and at Home by Peg Arita Miss & Marjo Bicker.  Pearly may also benefit from working with an executive functioning skill coach that can help address any areas of weakness and help set them up for success. For example, [employer] (www.chadd.org) maintains a [employer] that includes a number of AD/HD coaches.   Additional recommendations targeting executive functioning skills based on Tiffnay's BRIEF profile (from the BRIEF scoring program) include the following: Hendrix might benefit from a more explicit, extensive, and/or clear set of rules and expectations, and might need these reviewed with her regularly. For example, directions should be clear, concise, and explicit.  Often, it is important to limit distractions that are problematic for an individual who has inhibitory control difficulties. This might include visual and auditory distractions as well as other activities that can pull Marthella's attention away from a task. Environmental structure can be an important consideration for individuals like Ariyel. Less organized settings may have too many distractions and too many opportunities for impulsive behaviors.  It may  be helpful for individuals like Donica to practice developing more than one plan of approach to a task before starting. This can help focus attention on possible consequences and alternative strategies. If Tamar tends to rush through her work, making frequent errors, it will likely prove helpful to establish goals for accuracy and encourage her to take regular breaks to review her work for errors. An individual who has difficulties shifting set can often adjust to changes in schedule or routine with the use of visual organizers such as pictures, schedules, planners, and calendar boards.  Displaying a daily schedule and reviewing it at the outset of the day can help an adult like Satoya anticipate the sequence of events and can serve as a useful reminder of any changes in her daily routine. Any changes in scheduled activities, persons, or events can be placed on her schedule.  An  individual like Levenia who has difficulties shifting attention often needs to focus on only one task at a time. Presenting one task at a time and limiting choices to only one or two may be helpful. Halena might benefit from practicing her mental flexibility. Working with two or three familiar tasks and rotating them at regular intervals may help develop greater flexibility and help her become more accustomed to shifting. Tatyanna might benefit from external prompting to shift attention, behavior, or cognitive set from one activity or focus to the next. This may include, for example, the use of a timer on a watch or other device.  Some individuals can benefit from set time limits for each task before a shift to the next task is required. Leonora might work on one activity or assignment for a set period then an alternative activity for the next period. Use of a timer can facilitate Krystan's adjustment to change in activity. Having regularly scheduled times during the day in which different tasks are carried out, separated by rest breaks or leisure activities,  can facilitate shifting. For example, Jorie might work on task A each morning and task B each afternoon. Adults with initiation difficulties have trouble "getting going" or starting tasks. This can be exhibited in a number of ways: (a) behaviorally, such that they cannot get started on physical activities like getting up; (b) socially, such that they have difficulty calling friends or going out to be with friends; (c) academically or at work, such that they have trouble getting started on homework or assignments; or (d) cognitively, such that they have difficulty coming up with ideas or generating plans. Basic tenets of intervention include providing additional external structure, prompting and cuing, and helping with organization and planning. Problems with initiating may be exacerbated by Lillyen's sense of being overwhelmed by a given task. Tasks or assignments that seem too large can interfere with her ability to get started. Breaking tasks into smaller, more structured steps may reduce her sense of being overwhelmed and increase initiation. Methods designed to increase overall level of arousal or basic "energy level" can be useful for adults like Taela who have difficulty initiating on their own. Physical activity, group interaction, frequent short breaks with motor activity, and variation of pace or stimulation may be explored as means of increasing arousal and supporting initiation. It is often helpful to provide examples or work samples that serve as a model of what is expected. Shiquita can then follow the example to help cue what is next. It is important to appreciate that different tasks place varying demands on Desia's ability to initiate. Tasks that are inherently motivating often require less internal initiation than tasks that are less motivating. Similarly, more complex tasks may require greater initiation, and thus, more external prompting. Individuals like Aizley who demonstrate difficulties thinking of ideas  may benefit from learning a structured, systematic approach to idea generation. They can be taught idea generation strategies for performing activities or for ways to approach problems. Providing "to do" lists on paper or index cards can be a method of developing automatic routines and can serve as external cues to begin an activity. Some people benefit from keeping a binder or "cookbook" with lists of steps for each activity. They can look up a page with steps for completing a specific task, and use the list to guide their activity. Difficulties with initiating are often a problem of knowing where to start. Providing Azaela with greater organization for a task and demonstrating where  to begin and what steps to follow may help her overcome initiation difficulties. The rate of presentation for new material may need to be altered. Wendelyn may need additional processing time or time to rehearse novel information. Labrenda may benefit from having tasks or information broken down into smaller steps or chunks. This will help limit the demands placed on her working memory at any given time. Naasia may benefit from frequent short breaks during the completion of tasks. Breaks typically need only be 1 or 2 minutes in duration. Observing when Almadelia's ability to focus begins to wane will help determine the optimal time for a break. "Attentional breaks" are often best taken with a motor activity or a relaxing activity. For example, Tobie might take a brief walk or run a short errand. Lengthy tasks, particularly those that Geraldina experiences as tedious or monotonous, should be avoided or interspersed with more frequent breaks or other, more engaging tasks. Djeneba might be rewarded with a more stimulating activity for completing the more tedious task. Noely's ability to maintain the focus of her attention over time is likely susceptible to distractions. It will be important to reduce distractions in the environment. Anora may be better able to sustain  her attention on a task or activity if it is well-organized beforehand. Adults with working memory difficulties often benefit from multimodal presentation of information. Verbal instruction can be accompanied by visual cues, demonstration, and guidance to increase the likelihood that new material will be learned. New information or instructions may need to be kept brief and to the point, or repeated in concise fashion for her to ensure adequate understanding. Lizzett might benefit from instruction in the use of "self talk" as a means to compensate for difficulties in working memory. Initially, she might verbalize aloud as she steps through a task. Eventually, talking aloud can be minimized such that she relies on subvocalization or only a whisper to direct and maintain her focus. Individuals like Gisel often demonstrate difficulties keeping track of more than one or two steps at a time. Providing a written checklist of steps required to complete a task can serve as an external memory support and alleviate some of the burden on working memory. It is often helpful to provide examples of how others might plan differently to complete the same task. In this way, Chauntelle can see options for alternative methods. Rori may benefit from the use of a written planning system such as a notebook, calendar, or electronic day planner. This can be used to plan and track daily activities as well as keep track of completed, ongoing, and upcoming tasks being carried out toward both short-term and long-term goals. Mayley might benefit from talking aloud through a task, as this can increase attention to the task and, secondarily, increase error recognition. Model, cue, and encourage the use of the phrases "What am I doing?" and "The next step is." as self-monitoring tools. Have her gradually fade the talking aloud to whispering then to purely inner speech.  Some adults can benefit from having a checklist of needed materials to review on a  daily basis in the morning and at the end of the day. Disorganization of materials may be due, in part, to messiness. Keeping her workspace clutter-free by initiating the use of an organizational system (e.g., clearly labeled file folders and cabinets, in- and out- boxes) can be helpful.  Adults with learning and/or developmental disabilities may benefit from learning more about work accommodations and about how to talk to employers  about accommodations. More information about workplace accommodations and the Americans with Disabilities Act can be found at the [employer] (https://dunlap.com/). They can also be contacted by phone at [phone]. Their website also has a form letter for requesting workplace accommodations, and can be found at ATVShops.com.cy.cfm as well as a Designer, industrial/product of workplace accommodations.  Specific information regarding executive functioning accommodations, for example, can be found at: HairUnplugged.fi.cfm  Additional information regarding executive functioning challenges and accommodations can be found at: GemMerchandise.ch.cfm   Several websites may also be helpful when learning about ADHD. One helpful resource is: CHADD: Children and Adults with Attention-Deficit/Hyperactivity Disorder (www.chadd.org)  For example, the CHADD website has a section regarding common workplaces challenges for individuals with AD/HD and strategies for success, which can be found at: https://chadd.org/for-adults/workplace-issues/    If you have further questions regarding the results of this assessment, please contact me at [phone].     [name]  __________________________________, PhD [name], PhD Psychology License # [id number], Health Services Provider Certification:  HSP-P                 [name], PhD

## 2023-04-30 ENCOUNTER — Encounter: Payer: Self-pay | Admitting: Emergency Medicine

## 2023-04-30 ENCOUNTER — Ambulatory Visit
Admission: EM | Admit: 2023-04-30 | Discharge: 2023-04-30 | Disposition: A | Discharge status: Home / Self Care | Payer: 59 | Attending: Physician Assistant | Admitting: Physician Assistant

## 2023-04-30 DIAGNOSIS — R0981 Nasal congestion: Secondary | ICD-10-CM

## 2023-04-30 DIAGNOSIS — R051 Acute cough: Secondary | ICD-10-CM | POA: Diagnosis not present

## 2023-04-30 DIAGNOSIS — J069 Acute upper respiratory infection, unspecified: Secondary | ICD-10-CM | POA: Diagnosis not present

## 2023-04-30 MED ORDER — IPRATROPIUM BROMIDE 0.06 % NA SOLN: 2.0000 | Freq: Four times a day (QID) | NASAL | 0 refills | Status: DC

## 2023-04-30 MED ORDER — PROMETHAZINE-DM 6.25-15 MG/5ML PO SYRP: 5.0000 mL | ORAL_SOLUTION | Freq: Four times a day (QID) | ORAL | 0 refills | Status: DC | PRN

## 2023-04-30 NOTE — ED Provider Notes (Signed)
 [facility]    CSN: [id number] Arrival date & time: 04/30/23  0847      History   Chief Complaint Chief Complaint  Patient presents with   Nasal Congestion   Cough    HPI Jamie Dudley is a 43 y.o. female presenting for approximately 4.5-5-day history of voice hoarseness, dry cough and congestion.  Also reports feeling a little fatigued.  Denies fever, sore throat, or breathing difficulty.  No vomiting or diarrhea reported.  Also with family members are ill with similar symptoms.  She has been taking over-the-counter Mucinex and using Synex and says those medications have helped her symptoms.  No other complaints.  HPI  Past Medical History:  Diagnosis Date   Atypical squamous cells of undetermined significance (ASC-US) on cervical Pap smear    Depression    Sinusitis    Skin-picking disorder    Tendonitis     Patient Active Problem List   Diagnosis Date Noted   Diastasis recti 09/09/2021   Dyshidrotic eczema 10/02/2020   MDD (major depressive disorder), recurrent episode, moderate (HCC) 09/18/2017   Atypical squamous cell changes of undetermined significance (ASCUS) on cervical cytology with positive high risk human papilloma virus (HPV) 07/31/2013   Picking own skin 07/17/2011    Past Surgical History:  Procedure Laterality Date   COLPOSCOPY     WISDOM TOOTH EXTRACTION      OB History     Gravida  1   Para  1   Term  1   Preterm  0   AB  0   Living  1      SAB  0   IAB  0   Ectopic  0   Multiple  0   Live Births  1            Home Medications    Prior to Admission medications   Medication Sig Start Date End Date Taking? Authorizing Provider  ipratropium (ATROVENT) 0.06 % nasal spray Place 2 sprays into both nostrils 4 (four) times daily. 04/30/23  Yes Shirlee Latch, PA-C  promethazine-dextromethorphan (PROMETHAZINE-DM) 6.25-15 MG/5ML syrup Take 5 mLs by mouth 4 (four) times daily as needed. 04/30/23  Yes Eusebio Friendly B,  PA-C  fluticasone (FLONASE) 50 MCG/ACT nasal spray Place 2 sprays into both nostrils daily. 11/12/19   Domenick Gong, MD  triamcinolone cream (KENALOG) 0.1 % Apply 1 Application topically 2 (two) times daily. 12/09/21   Duanne Limerick, MD    Family History Family History  Problem Relation Age of Onset   Heart disease Paternal Grandfather    Hypertension Mother    Breast cancer Neg Hx    Ovarian cancer Neg Hx    Colon cancer Neg Hx     Social History Social History   Tobacco Use   Smoking status: Never   Smokeless tobacco: Never  Vaping Use   Vaping status: Never Used  Substance Use Topics   Alcohol use: Not Currently    Comment: rare   Drug use: No     Allergies   Patient has no known allergies.   Review of Systems Review of Systems  Constitutional:  Positive for fatigue. Negative for chills, diaphoresis and fever.  HENT:  Positive for congestion, rhinorrhea and trouble swallowing. Negative for ear pain, sinus pressure, sinus pain and sore throat.   Respiratory:  Positive for cough. Negative for shortness of breath and wheezing.   Cardiovascular:  Negative for chest pain.  Gastrointestinal:  Negative for  abdominal pain, nausea and vomiting.  Musculoskeletal:  Negative for arthralgias and myalgias.  Skin:  Negative for rash.  Neurological:  Negative for weakness and headaches.  Hematological:  Negative for adenopathy.     Physical Exam Triage Vital Signs  No data found.  Updated Vital Signs BP (!) 146/97 (BP Location: Left Arm)   Pulse 68   Temp 98.3 F (36.8 C) (Oral)   Resp 18   Ht 5\' 5"  (1.651 m)   Wt 150 lb (68 kg)   LMP 04/23/2023 (Approximate)   SpO2 100%   BMI 24.96 kg/m      Physical Exam Vitals and nursing note reviewed.  Constitutional:      General: She is not in acute distress.    Appearance: Normal appearance. She is not ill-appearing or toxic-appearing.     Comments: +voice hoarseness  HENT:     Head: Normocephalic and  atraumatic.     Nose: Congestion present.     Mouth/Throat:     Mouth: Mucous membranes are moist.     Pharynx: Oropharynx is clear. Posterior oropharyngeal erythema (mild with clear PND) present.  Eyes:     General: No scleral icterus.       Right eye: No discharge.        Left eye: No discharge.     Conjunctiva/sclera: Conjunctivae normal.  Cardiovascular:     Rate and Rhythm: Normal rate and regular rhythm.     Heart sounds: Normal heart sounds.  Pulmonary:     Effort: Pulmonary effort is normal. No respiratory distress.     Breath sounds: Normal breath sounds.  Musculoskeletal:     Cervical back: Neck supple.  Skin:    General: Skin is dry.  Neurological:     General: No focal deficit present.     Mental Status: She is alert. Mental status is at baseline.     Motor: No weakness.     Gait: Gait normal.  Psychiatric:        Mood and Affect: Mood normal.        Behavior: Behavior normal.      UC Treatments / Results  Labs (all labs ordered are listed, but only abnormal results are displayed) Labs Reviewed - No data to display   EKG   Radiology No results found.  Procedures Procedures (including critical care time)  Medications Ordered in UC Medications - No data to display  Initial Impression / Assessment and Plan / UC Course  I have reviewed the triage vital signs and the nursing notes.  Pertinent labs & imaging results that were available during my care of the patient were reviewed by me and considered in my medical decision making (see chart for details).  43 year old female presenting for 5-day history of nasal congestion, cough and voice hoarseness as well as fatigue.  No associated fever.  As well as sick contacts in the household with similar symptoms.  Negative COVID test at home.  Vitals are stable.  She is afebrile.  She is overall well-appearing.  On exam she does have nasal congestion and mild posterior frontal erythema.  Chest clear to auscultation  and heart regular rate and rhythm.  Advised her symptoms consistent with a viral URI.  Supportive care encouraged with continuing medications as she has been taking.  Also sent Promethazine DM and Atrovent nasal spray.  Reviewed return and ER precautions and handout.  Work note given.   Final Clinical Impressions(s) / UC Diagnoses   Final diagnoses:  Viral upper respiratory tract infection  Acute cough  Nasal congestion     Discharge Instructions      URI/COLD SYMPTOMS: Your exam today is consistent with a viral illness. Antibiotics are not indicated at this time. Use medications as directed, including cough syrup, nasal saline, and decongestants. Your symptoms should improve over the next few days and resolve within 7-10 days. Increase rest and fluids. F/u if symptoms worsen or predominate such as sore throat, ear pain, productive cough, shortness of breath, or if you develop high fevers or worsening fatigue over the next several days.        ED Prescriptions     Medication Sig Dispense Auth. Provider   promethazine-dextromethorphan (PROMETHAZINE-DM) 6.25-15 MG/5ML syrup Take 5 mLs by mouth 4 (four) times daily as needed. 118 mL Eusebio Friendly B, PA-C   ipratropium (ATROVENT) 0.06 % nasal spray Place 2 sprays into both nostrils 4 (four) times daily. 15 mL Shirlee Latch, PA-C      PDMP not reviewed this encounter.       Shirlee Latch, PA-C 04/30/23 1018

## 2023-04-30 NOTE — ED Triage Notes (Signed)
 Pt c/o cough, nasal congestion, hoarseness, and elevated temp. Started about 6 days ago.

## 2023-04-30 NOTE — Discharge Instructions (Signed)

## 2023-07-21 ENCOUNTER — Telehealth: Payer: Self-pay

## 2023-07-21 NOTE — Telephone Encounter (Signed)
 Copied from CRM [id number]. Topic: Appointments - Transfer of Care >> Jul 21, 2023 12:38 PM [place] wrote: Pt is requesting to transfer FROM: Dr Rochelle Chu Pt is requesting to transfer TO: Dr Cari Char Reason for requested transfer: Dr Rochelle Chu is retiring It is the responsibility of the team the patient would like to transfer to (Dr. Cari Char) to reach out to the patient if for any reason this transfer is not acceptable.

## 2023-07-25 ENCOUNTER — Ambulatory Visit (INDEPENDENT_AMBULATORY_CARE_PROVIDER_SITE_OTHER): Admitting: Student

## 2023-07-25 ENCOUNTER — Encounter: Payer: Self-pay | Admitting: Student

## 2023-07-25 VITALS — BP 118/82 | Ht 65.0 in | Wt 145.8 lb

## 2023-07-25 DIAGNOSIS — Z1159 Encounter for screening for other viral diseases: Secondary | ICD-10-CM

## 2023-07-25 DIAGNOSIS — K5900 Constipation, unspecified: Secondary | ICD-10-CM | POA: Insufficient documentation

## 2023-07-25 DIAGNOSIS — F909 Attention-deficit hyperactivity disorder, unspecified type: Secondary | ICD-10-CM | POA: Diagnosis not present

## 2023-07-25 DIAGNOSIS — Z0000 Encounter for general adult medical examination without abnormal findings: Secondary | ICD-10-CM | POA: Diagnosis not present

## 2023-07-25 DIAGNOSIS — R87610 Atypical squamous cells of undetermined significance on cytologic smear of cervix (ASC-US): Secondary | ICD-10-CM

## 2023-07-25 DIAGNOSIS — F419 Anxiety disorder, unspecified: Secondary | ICD-10-CM | POA: Insufficient documentation

## 2023-07-25 DIAGNOSIS — R87810 Cervical high risk human papillomavirus (HPV) DNA test positive: Secondary | ICD-10-CM

## 2023-07-25 NOTE — Assessment & Plan Note (Signed)
 2 episodes of blood in the stool after a large bowel movement. She is not sure if she has hemorrhoids. She deferred rectal exam today. Having BM daily but has hard stools, dicussed increasing fiber intake and hydration. Will have her follow up if this continues to happen.

## 2023-07-25 NOTE — Assessment & Plan Note (Signed)
 Mildly elevated PHQ9 and GAD7, has not been seeing therapy as regularly encouraged her to follow up with this.

## 2023-07-25 NOTE — Assessment & Plan Note (Signed)
 History of ASCUS in 07/10/2014, NSIL with negative high risk HPV in 09/12/16 and again on 03/09/2022. Thinks she has had another pap smear since then. Will request records. Plan for next pap in 2028

## 2023-07-25 NOTE — Assessment & Plan Note (Signed)
 Appears she was evaluated for this at [facility] in November/December 2024, would like to work on therapy for coping mechanisms but has not made an appointment with her therapist. Encouraged her to make an appointment to discuss this.

## 2023-07-25 NOTE — Assessment & Plan Note (Signed)
 Discussed healthy exercise and diet. She is currently walking 15 minutes a day, is trying to increase intensity.  She eats a normal diet, denies sleep issues Lab work ordered today

## 2023-07-25 NOTE — Progress Notes (Signed)
 New Patient Office Visit  Subjective    Patient ID: Jamie Dudley, female    DOB: October 28, 1980  Age: 43 y.o. MRN: [id number]  CC:  Chief Complaint  Patient presents with   Annual Exam    HPI Jamie Dudley 43 year old person presents to establish care.  ADHD Recently diagnosed in the end of 2024. Is not currently on medication. Has trouble remebering tasks and completing tasks on time. She does have a therapist who she sees for anxiety, would like to discuss coping strategies, however she has not made an appointment. She is unsure if she would like to try medication at this time.   Blood in stool Reports 2-3 BM with small amount of blood over the last year. Last episode was a few days ago. Reports having increased urge and unusually large BM and noticed blood in the bowl. No blood when wiping and  Has bowel movents daily, tend to be hard and pebbly. Denies melena, abdominal pain, rectal pain, diarrhea, weight loss, or fever. No family history of colon cancer. Does not have issues with bleeding and does not regularly take NSAIDS.  Anxiety Reports history and anxiety and depression, increased skin picking when stressed, has a therapist who she was seeing weekly but has not been back in a few months. Has not taken any medications in the past, reports she was too anxious to try. Feels anxiety is well controlled currently. Dnies SI, HI, history of mania, or AVD.   Outpatient Encounter Medications as of 07/25/2023  Medication Sig   [DISCONTINUED] fluticasone  (FLONASE ) 50 MCG/ACT nasal spray Place 2 sprays into both nostrils daily. (Patient not taking: Reported on 07/25/2023)   [DISCONTINUED] ipratropium (ATROVENT ) 0.06 % nasal spray Place 2 sprays into both nostrils 4 (four) times daily. (Patient not taking: Reported on 07/25/2023)   [DISCONTINUED] promethazine -dextromethorphan (PROMETHAZINE -DM) 6.25-15 MG/5ML syrup Take 5 mLs by mouth 4 (four) times daily as needed.   [DISCONTINUED] triamcinolone  cream  (KENALOG ) 0.1 % Apply 1 Application topically 2 (two) times daily. (Patient not taking: Reported on 07/25/2023)   No facility-administered encounter medications on file as of 07/25/2023.    Past Medical History:  Diagnosis Date   Atypical squamous cells of undetermined significance (ASC-US ) on cervical Pap smear    Depression    Sinusitis    Skin-picking disorder    Tendonitis     Past Surgical History:  Procedure Laterality Date   COLPOSCOPY     WISDOM TOOTH EXTRACTION      Family History  Problem Relation Age of Onset   Hypertension Mother    Miscarriages / Stillbirths Mother    Heart disease Father    Heart disease Paternal Grandfather    Breast cancer Neg Hx    Ovarian cancer Neg Hx    Colon cancer Neg Hx     Social History   Socioeconomic History   Marital status: Single    Spouse name: Not on file   Number of children: Not on file   Years of education: Not on file   Highest education level: Not on file  Occupational History   Not on file  Tobacco Use   Smoking status: Never   Smokeless tobacco: Never  Vaping Use   Vaping status: Never Used  Substance and Sexual Activity   Alcohol use: Not Currently    Comment: rare   Drug use: Never   Sexual activity: Yes    Birth control/protection: Other-see comments, None    Comment: Pregnancy  Other Topics Concern   Not on file  Social History Narrative   Not on file   Social Drivers of Health   Financial Resource Strain: Not on file  Food Insecurity: Not on file  Transportation Needs: Not on file  Physical Activity: Not on file  Stress: Not on file  Social Connections: Not on file  Intimate Partner Violence: Not on file    ROS Refer to HPI    Objective   BP 118/82 (BP Location: Left Arm, Patient Position: Sitting, Cuff Size: Normal)   Ht 5\' 5"  (1.651 m)   Wt 145 lb 12.8 oz (66.1 kg)   LMP 07/04/2023 (Exact Date)   BMI 24.26 kg/m   Physical Exam Constitutional:      Appearance: Normal  appearance.  Eyes:     Extraocular Movements: Extraocular movements intact.     Pupils: Pupils are equal, round, and reactive to light.  Cardiovascular:     Rate and Rhythm: Normal rate and regular rhythm.     Pulses: Normal pulses.     Heart sounds: No murmur heard. Pulmonary:     Effort: Pulmonary effort is normal.     Breath sounds: No rhonchi or rales.  Abdominal:     General: Abdomen is flat. Bowel sounds are normal. There is no distension.     Palpations: Abdomen is soft.     Tenderness: There is no abdominal tenderness.  Musculoskeletal:        General: Normal range of motion.     Right lower leg: No edema.     Left lower leg: No edema.  Skin:    General: Skin is warm and dry.     Capillary Refill: Capillary refill takes less than 2 seconds.  Neurological:     General: No focal deficit present.     Mental Status: She is alert and oriented to person, place, and time.  Psychiatric:        Mood and Affect: Mood normal.        Behavior: Behavior normal.        07/25/2023    8:16 AM 12/09/2021    9:20 AM 10/01/2020   10:01 AM  Depression screen PHQ 2/9  Decreased Interest 1 0 0  Down, Depressed, Hopeless 1 1 0  PHQ - 2 Score 2 1 0  Altered sleeping 0 0 0  Tired, decreased energy 2 0 0  Change in appetite 0 0 0  Feeling bad or failure about yourself  0 0 0  Trouble concentrating 2 0 0  Moving slowly or fidgety/restless 2 0 0  Suicidal thoughts 0 0 0  PHQ-9 Score 8 1 0  Difficult doing work/chores Somewhat difficult Not difficult at all       07/25/2023    8:16 AM 12/09/2021    9:20 AM 10/01/2020   10:01 AM 09/20/2019    9:44 AM  GAD 7 : Generalized Anxiety Score  Nervous, Anxious, on Edge 1 0 1 0  Control/stop worrying 0 0 1 0  Worry too much - different things 1 0 0 0  Trouble relaxing 2 1 0 0  Restless 3 1 0 0  Easily annoyed or irritable 1 0 1 0  Afraid - awful might happen 0 0 0 0  Total GAD 7 Score 8 2 3  0  Anxiety Difficulty Somewhat difficult Not  difficult at all Not difficult at all     Last CBC Lab Results  Component Value Date   WBC  7.1 10/21/2019   HGB 14.5 10/21/2019   HCT 44.0 10/21/2019   MCV 91.5 10/21/2019   MCH 30.1 10/21/2019   RDW 12.6 10/21/2019   PLT 227 10/21/2019   Last metabolic panel Lab Results  Component Value Date   GLUCOSE 97 10/21/2019   NA 139 10/21/2019   K 3.4 (L) 10/21/2019   CL 105 10/21/2019   CO2 27 10/21/2019   BUN 20 10/21/2019   CREATININE 0.72 10/21/2019   GFRNONAA >60 10/21/2019   CALCIUM 8.4 (L) 10/21/2019   PROT 7.6 10/21/2019   ALBUMIN 4.5 10/21/2019   LABGLOB 2.3 12/03/2018   AGRATIO 1.8 12/03/2018   BILITOT 1.5 (H) 10/21/2019   ALKPHOS 52 10/21/2019   AST 15 10/21/2019   ALT 14 10/21/2019   ANIONGAP 7 10/21/2019        Assessment & Plan:  Annual physical exam Assessment & Plan: Discussed healthy exercise and diet. She is currently walking 15 minutes a day, is trying to increase intensity.  She eats a normal diet, denies sleep issues Lab work ordered today   Orders: -     CBC -     Comprehensive metabolic panel with GFR -     Lipid panel -     Hemoglobin A1c -     TSH -     Hepatitis C antibody  Atypical squamous cell changes of undetermined significance (ASCUS) on cervical cytology with positive high risk human papilloma virus (HPV) Assessment & Plan: History of ASCUS in 07/10/2014, NSIL with negative high risk HPV in 09/12/16 and again on 03/09/2022. Thinks she has had another pap smear since then. Will request records. Plan for next pap in 2028   Anxiety Assessment & Plan: Mildly elevated PHQ9 and GAD7, has not been seeing therapy as regularly encouraged her to follow up with this.    Attention deficit hyperactivity disorder (ADHD), unspecified ADHD type Assessment & Plan: Appears she was evaluated for this at [facility] in November/December 2024, would like to work on therapy for coping mechanisms but has not made an appointment with her therapist.  Encouraged her to make an appointment to discuss this.     Constipation, unspecified constipation type Assessment & Plan: 2 episodes of blood in the stool after a large bowel movement. She is not sure if she has hemorrhoids. She deferred rectal exam today. Having BM daily but has hard stools, dicussed increasing fiber intake and hydration. Will have her follow up if this continues to happen.      Return in about 6 months (around 01/25/2024) for mood.   Barnetta Liberty, MD

## 2023-07-26 ENCOUNTER — Ambulatory Visit: Payer: Self-pay | Admitting: Student

## 2023-07-26 LAB — CBC
Hematocrit: 44.2 % (ref 34.0–46.6)
Hemoglobin: 14.1 g/dL (ref 11.1–15.9)
MCH: 30.1 pg (ref 26.6–33.0)
MCHC: 31.9 g/dL (ref 31.5–35.7)
MCV: 94 fL (ref 79–97)
Platelets: 203 10*3/uL (ref 150–450)
RBC: 4.69 x10E6/uL (ref 3.77–5.28)
RDW: 12.3 % (ref 11.7–15.4)
WBC: 4.2 10*3/uL (ref 3.4–10.8)

## 2023-07-26 LAB — LIPID PANEL
Chol/HDL Ratio: 3.2 ratio (ref 0.0–4.4)
Cholesterol, Total: 185 mg/dL (ref 100–199)
HDL: 58 mg/dL (ref >39)
LDL Chol Calc (NIH): 118 mg/dL — ABNORMAL HIGH (ref 0–99)
Triglycerides: 47 mg/dL (ref 0–149)
VLDL Cholesterol Cal: 9 mg/dL (ref 5–40)

## 2023-07-26 LAB — HEMOGLOBIN A1C
Est. average glucose Bld gHb Est-mCnc: 114 mg/dL
Hgb A1c MFr Bld: 5.6 % (ref 4.8–5.6)

## 2023-07-26 LAB — COMPREHENSIVE METABOLIC PANEL WITH GFR
ALT: 7 IU/L (ref 0–32)
AST: 11 IU/L (ref 0–40)
Albumin: 4.4 g/dL (ref 3.9–4.9)
Alkaline Phosphatase: 37 IU/L — ABNORMAL LOW (ref 44–121)
BUN/Creatinine Ratio: 22 (ref 9–23)
BUN: 20 mg/dL (ref 6–24)
Bilirubin Total: 0.6 mg/dL (ref 0.0–1.2)
CO2: 20 mmol/L (ref 20–29)
Calcium: 8.9 mg/dL (ref 8.7–10.2)
Chloride: 105 mmol/L (ref 96–106)
Creatinine, Ser: 0.93 mg/dL (ref 0.57–1.00)
Globulin, Total: 2.2 g/dL (ref 1.5–4.5)
Glucose: 97 mg/dL (ref 70–99)
Potassium: 4.7 mmol/L (ref 3.5–5.2)
Sodium: 139 mmol/L (ref 134–144)
Total Protein: 6.6 g/dL (ref 6.0–8.5)
eGFR: 79 mL/min/{1.73_m2} (ref >59)

## 2023-07-26 LAB — HEPATITIS C ANTIBODY: Hep C Virus Ab: NONREACTIVE

## 2023-07-26 LAB — TSH: TSH: 2.67 u[IU]/mL (ref 0.450–4.500)

## 2024-01-25 ENCOUNTER — Ambulatory Visit: Admitting: Student
